# Patient Record
Sex: Female | Born: 1960
Health system: Southern US, Community
[De-identification: ages and names within clinical notes are randomized; demographics above are authoritative.]

## PROBLEM LIST (undated history)

## (undated) DIAGNOSIS — K859 Acute pancreatitis without necrosis or infection, unspecified: Secondary | ICD-10-CM

## (undated) DIAGNOSIS — E785 Hyperlipidemia, unspecified: Secondary | ICD-10-CM

## (undated) DIAGNOSIS — K219 Gastro-esophageal reflux disease without esophagitis: Secondary | ICD-10-CM

## (undated) DIAGNOSIS — K529 Noninfective gastroenteritis and colitis, unspecified: Secondary | ICD-10-CM

## (undated) DIAGNOSIS — I671 Cerebral aneurysm, nonruptured: Secondary | ICD-10-CM

## (undated) DIAGNOSIS — I493 Ventricular premature depolarization: Secondary | ICD-10-CM

## (undated) DIAGNOSIS — Z720 Tobacco use: Secondary | ICD-10-CM

## (undated) DIAGNOSIS — Q2381 Bicuspid aortic valve: Secondary | ICD-10-CM

## (undated) DIAGNOSIS — R011 Cardiac murmur, unspecified: Secondary | ICD-10-CM

## (undated) DIAGNOSIS — I351 Nonrheumatic aortic (valve) insufficiency: Secondary | ICD-10-CM

## (undated) DIAGNOSIS — M797 Fibromyalgia: Secondary | ICD-10-CM

## (undated) DIAGNOSIS — I10 Essential (primary) hypertension: Secondary | ICD-10-CM

## (undated) DIAGNOSIS — Q231 Congenital insufficiency of aortic valve: Secondary | ICD-10-CM

## (undated) DIAGNOSIS — K8020 Calculus of gallbladder without cholecystitis without obstruction: Secondary | ICD-10-CM

## (undated) HISTORY — DX: Cerebral aneurysm, nonruptured: I67.1

## (undated) HISTORY — DX: Cardiac murmur, unspecified: R01.1

## (undated) HISTORY — DX: Fibromyalgia: M79.7

## (undated) HISTORY — DX: Calculus of gallbladder without cholecystitis without obstruction: K80.20

## (undated) HISTORY — DX: Bicuspid aortic valve: Q23.81

## (undated) HISTORY — DX: Acute pancreatitis without necrosis or infection, unspecified: K85.90

## (undated) HISTORY — DX: Congenital insufficiency of aortic valve: Q23.1

## (undated) HISTORY — DX: Hyperlipidemia, unspecified: E78.5

## (undated) HISTORY — PX: ABDOMINAL HYSTERECTOMY: SHX81

## (undated) HISTORY — PX: APPENDECTOMY: SHX54

## (undated) HISTORY — PX: CHOLECYSTECTOMY: SHX55

## (undated) HISTORY — DX: Essential (primary) hypertension: I10

## (undated) HISTORY — DX: Noninfective gastroenteritis and colitis, unspecified: K52.9

---

## 1998-03-22 ENCOUNTER — Encounter: Payer: Self-pay | Admitting: Emergency Medicine

## 1998-03-22 ENCOUNTER — Emergency Department (HOSPITAL_COMMUNITY): Admission: EM | Admit: 1998-03-22 | Discharge: 1998-03-22 | Payer: Self-pay | Admitting: Emergency Medicine

## 2001-03-20 ENCOUNTER — Inpatient Hospital Stay (HOSPITAL_COMMUNITY): Admission: EM | Admit: 2001-03-20 | Discharge: 2001-03-21 | Payer: Self-pay | Admitting: *Deleted

## 2001-03-20 ENCOUNTER — Encounter: Payer: Self-pay | Admitting: *Deleted

## 2001-05-31 ENCOUNTER — Other Ambulatory Visit: Admission: RE | Admit: 2001-05-31 | Discharge: 2001-05-31 | Payer: Self-pay | Admitting: Obstetrics and Gynecology

## 2011-06-03 ENCOUNTER — Ambulatory Visit: Payer: Self-pay | Admitting: Family Medicine

## 2011-06-03 DIAGNOSIS — R109 Unspecified abdominal pain: Secondary | ICD-10-CM

## 2011-06-03 LAB — POCT CBC
Granulocyte percent: 79.7 %G (ref 37–80)
HCT, POC: 39 % (ref 37.7–47.9)
Hemoglobin: 15.5 g/dL (ref 12.2–16.2)
Lymph, poc: 2.2 (ref 0.6–3.4)
MCH, POC: 29 pg (ref 27–31.2)
MCHC: 32.1 g/dL (ref 31.8–35.4)
MCV: 90.5 fL (ref 80–97)
MID (cbc): 0.7 (ref 0–0.9)
MPV: 9.6 fL (ref 0–99.8)
POC Granulocyte: 11.6 — AB (ref 2–6.9)
POC LYMPH PERCENT: 15.2 %L (ref 10–50)
POC MID %: 5.1 %M (ref 0–12)
Platelet Count, POC: 313 10*3/uL (ref 142–424)
RBC: 4.31 M/uL (ref 4.04–5.48)
RDW, POC: 14 %
WBC: 14.6 10*3/uL — AB (ref 4.6–10.2)

## 2011-06-03 MED ORDER — PROMETHAZINE HCL 25 MG PO TABS: 25.0000 mg | ORAL_TABLET | Freq: Three times a day (TID) | ORAL | Status: DC | PRN

## 2011-06-03 MED ORDER — HYDROCODONE-ACETAMINOPHEN 5-500 MG PO TABS: 1.0000 | ORAL_TABLET | Freq: Three times a day (TID) | ORAL | Status: AC | PRN

## 2011-06-03 MED ORDER — KETOROLAC TROMETHAMINE 60 MG/2ML IM SOLN
60.0000 mg | Freq: Once | INTRAMUSCULAR | Status: AC
  Administered 2011-06-03: 60 mg via INTRAMUSCULAR

## 2011-06-03 MED ORDER — PROMETHAZINE HCL 25 MG/ML IJ SOLN
25.0000 mg | Freq: Four times a day (QID) | INTRAMUSCULAR | Status: DC | PRN
  Administered 2011-06-03: 25 mg via INTRAMUSCULAR

## 2011-06-03 NOTE — Progress Notes (Signed)
  Subjective:    Patient ID: Mary Wade, female    DOB: 05/09/1960, 51 y.o.   MRN: [id number]  HPI 51 yo female with abdominal complaints. Acid reflux type symptoms about 10pm.  ABout midnight abdominal pain worsened.  Took zantac - helped some.  Was able to sleep.  3am pain returned.  Tried prilosec - no help.  Vomitting started.  Frequent and recurrent.  Anytime she eats or drinks anything it comes back up.  Even water.  No diarrhea.  No fever.   S/p cholecystecomy and appendectomy.    Similar bout of this previously - never really diagnosed.  Just given phenergan and told to drink fluids and in a few days improved.  +EtOH drinker - last heavy drinking was SAturday night.   Review of Systems Negative except as per HPI     Objective:   Physical Exam  Constitutional: Vital signs are normal. She appears well-developed and well-nourished. She is active.  Cardiovascular: Normal rate, regular rhythm, normal heart sounds and normal pulses.   Pulmonary/Chest: Effort normal and breath sounds normal.  Abdominal: Soft. Normal appearance and bowel sounds are normal. She exhibits no distension and no mass. There is no hepatosplenomegaly. There is tenderness. There is no rigidity, no rebound, no guarding, no CVA tenderness, no tenderness at McBurney's point and negative Murphy's sign. No hernia.       Epigastric abdominal pain   Neurological: She is alert.    Results for orders placed in visit on 06/03/11  POCT CBC      Component Value Range   WBC 14.6 (*) 4.6 - 10.2 (K/uL)   Lymph, poc 2.2  0.6 - 3.4    POC LYMPH PERCENT 15.2  10 - 50 (%L)   MID (cbc) 0.7  0 - 0.9    POC MID % 5.1  0 - 12 (%M)   POC Granulocyte 11.6 (*) 2 - 6.9    Granulocyte percent 79.7  37 - 80 (%G)   RBC 4.31  4.04 - 5.48 (M/uL)   Hemoglobin 15.5  12.2 - 16.2 (g/dL)   HCT, POC 13.2  44.0 - 47.9 (%)   MCV 90.5  80 - 97 (fL)   MCH, POC 29.0  27 - 31.2 (pg)   MCHC 32.1  31.8 - 35.4 (g/dL)   RDW, POC 10.2     Platelet Count, POC 313  142 - 424 (K/uL)   MPV 9.6  0 - 99.8 (fL)         Assessment & Plan:  Abdominal pain, leukocytosis - likely pancreatitis.  Given 1L NS here, phenergan and toradol with some relief of pain and nausea.  Send home with phenergan and vicodin 5.  Clear liquid diet.  Await lipase and cmet.  F/U in 48 hours.  Discussed red flags to go to ED (intense pain unrelievved at all by meds or prolonged nausea and vomitting)

## 2011-06-05 ENCOUNTER — Ambulatory Visit: Payer: Self-pay | Admitting: Emergency Medicine

## 2011-06-05 DIAGNOSIS — R109 Unspecified abdominal pain: Secondary | ICD-10-CM

## 2011-06-05 DIAGNOSIS — I10 Essential (primary) hypertension: Secondary | ICD-10-CM

## 2011-06-05 DIAGNOSIS — G4700 Insomnia, unspecified: Secondary | ICD-10-CM

## 2011-06-05 LAB — COMPREHENSIVE METABOLIC PANEL
ALT: 36 U/L — ABNORMAL HIGH (ref 0–35)
AST: 24 U/L (ref 0–37)
Albumin: 4.3 g/dL (ref 3.5–5.2)
Alkaline Phosphatase: 66 U/L (ref 39–117)
BUN: 6 mg/dL (ref 6–23)
CO2: 26 mEq/L (ref 19–32)
Calcium: 9.5 mg/dL (ref 8.4–10.5)
Chloride: 99 mEq/L (ref 96–112)
Creat: 0.42 mg/dL — ABNORMAL LOW (ref 0.50–1.10)
Glucose, Bld: 117 mg/dL — ABNORMAL HIGH (ref 70–99)
Potassium: 3.5 mEq/L (ref 3.5–5.3)
Sodium: 136 mEq/L (ref 135–145)
Total Bilirubin: 0.6 mg/dL (ref 0.3–1.2)
Total Protein: 7 g/dL (ref 6.0–8.3)

## 2011-06-05 LAB — POCT CBC
Lymph, poc: 2.8 (ref 0.6–3.4)
MCH, POC: 29.1 pg (ref 27–31.2)
MCHC: 31.8 g/dL (ref 31.8–35.4)
MPV: 9.1 fL (ref 0–99.8)
POC Granulocyte: 4.6 (ref 2–6.9)
POC LYMPH PERCENT: 35.3 %L (ref 10–50)
POC MID %: 6.3 %M (ref 0–12)
RDW, POC: 13.9 %
WBC: 7.9 10*3/uL (ref 4.6–10.2)

## 2011-06-05 LAB — LIPASE: Lipase: 17 U/L (ref 0–75)

## 2011-06-05 MED ORDER — BISOPROLOL-HYDROCHLOROTHIAZIDE 5-6.25 MG PO TABS: 1.0000 | ORAL_TABLET | Freq: Every day | ORAL | Status: DC

## 2011-06-05 MED ORDER — ALPRAZOLAM 0.5 MG PO TABS: 0.5000 mg | ORAL_TABLET | Freq: Every evening | ORAL | Status: DC | PRN

## 2011-06-05 NOTE — Patient Instructions (Signed)
Acute Pancreatitis The pancreas is a large gland located behind your stomach. It produces (secretes) enzymes. These enzymes help digest food. It also releases the hormones glucagon and insulin. These hormones help regulate blood sugar. When the pancreas becomes inflamed, the disease is called pancreatitis. Inflammation of the pancreas occurs when enzymes from the pancreas begin attacking and digesting the pancreas. CAUSES  Most cases ofsudden onset (acute) pancreatitis are caused by:  Alcohol abuse.   Gallstones.  Other less common causes are:  Some medications.   Exposure to certain chemicals   Infection.   Damage caused by an accident (trauma).   Surgery of the belly (abdomen).  SYMPTOMS  Acute pancreatitis usually begins with pain in the upper abdomen and may radiate to the back. This pain may last a couple days. The constant pain varies from mild to severe. The acute form of this disease may vary from mild, nonspecific abdominal pain to profound shock with coma. About 1 in 5 cases are severe. These patients become dehydrated and develop low blood pressure. In severe cases, bleeding into the pancreas can lead to shock and death. The lungs, heart, and kidneys may fail. DIAGNOSIS  Your caregiver will form a clinical opinion after giving you an exam. Laboratory work is used to confirm this diagnosis. Often,a digestive enzyme from the pancreas (serum amylase) and other enzymes are elevated. Sugars and fats (lipids) in the blood may be elevated. There may also be changes in the following levels: calcium, magnesium, potassium, chloride and bicarbonate (chemicals in the blood). X-rays, a CT scan, or ultrasound of your abdomen may be necessary to search for other causes of your abdominal pain. TREATMENT  Most pancreatitis requires treatment of symptoms. Most acute attacks last a couple of days. Your caregiver can discuss the treatment options with you.  If complications occur, hospitalization  may be necessary for pain control and intravenous (IV) fluid replacement.   Sometimes, a tube may be put into the stomach to control vomiting.   Food may not be allowed for 3 to 4 days. This gives the pancreas time to rest. Giving the pancreas a rest means there is no stimulation that would produce more enzymes and cause more damage.   Medicines (antibiotics) that kill germs may be given if infection is the cause.   Sometimes, surgery may be required.   Following an acute attack, your caregiver will determine the cause, if possible, and offer suggestions to prevent recurrences.  HOME CARE INSTRUCTIONS   Eat smaller, more frequent meals. This reduces the amount of digestive juices the pancreas produces.   Decrease the amount of fat in your diet. This may help reduce loose, diarrheal stools.   Drink enough water and fluids to keep your urine clear or pale yellow. This is to avoid dehydration which can cause increased pain.   Talk to your caregiver about pain relievers or other medicines that may help.   Avoid anything that may have triggered your pancreatitis (for example, alcohol).   Follow the diet advised by your caregiver. Do not advance the diet too soon.   Take medicines as prescribed.   Get plenty of rest.   Check your blood sugar at home as directed by your caregiver.   If your caregiver has given you a follow-up appointment, it is very important to keep that appointment. Not keeping the appointment could result in a lasting (chronic) or permanent injury, pain, and disability. If there is any problem keeping the appointment, you must call to reschedule.    SEEK MEDICAL CARE IF:   You are not recovering in the time described by your caregiver.   You have persistent pain, weakness, or feel sick to your stomach (nauseous).   You have recovered and then have another bout of pain.  SEEK IMMEDIATE MEDICAL CARE IF:   You are unable to eat or keep fluids down.   Your pain  increases a lot or changes.   You have an oral temperature above 102 F (38.9 C), not controlled by medicine.   Your skin or the white part of your eyes look yellow (jaundice).   You develop vomiting.   You feel dizzy or faint.   Your blood sugar is high (over 300).  MAKE SURE YOU:   Understand these instructions.   Will watch your condition.   Will get help right away if you are not doing well or get worse.  Document Released: 02/01/2005 Document Revised: 01/21/2011 Document Reviewed: 09/15/2007 [employer] Patient Information 2012 [employer]. 

## 2011-06-05 NOTE — Progress Notes (Signed)
  Subjective:    Patient ID: Mary Wade, female    DOB: Feb 20, 1960, 51 y.o.   MRN: [id number]  HPI patient seen 48 hours ago with severe midepigastric abdominal pain nausea and vomiting she does have a history of heavy alcohol use. She was treated for pancreatitis she received IV fluids pain medications and returns today markedly better. Lab work returned revealed minimal elevation in one LFT as well as elevation in her white count to her lipase which was done was normal.    Review of Systems patient does smoke. She does drink heavily on Thursdays and 2 other days per week she drinks up to 4 drinks a day.     Objective:   Physical Exam  Constitutional: She appears well-developed and well-nourished.  HENT:  Head: Normocephalic.  Eyes: Pupils are equal, round, and reactive to light.  Neck: No tracheal deviation present. No thyromegaly present.  Cardiovascular: Normal rate and regular rhythm.   Pulmonary/Chest: Effort normal and breath sounds normal.  Abdominal: Soft. Bowel sounds are normal. There is tenderness. There is guarding.       The patient has exquisite tenderness in her mid epigastrium. This is not associated with rebound.        Assessment & Plan:   Patient here to followup on abdominal pain. She is markedly better than previous. She does not appear acutely ill today. I do think that alcohol is an issue and will discuss with her alternatives to help with that. We'll check CBC today

## 2011-09-08 ENCOUNTER — Other Ambulatory Visit: Payer: Self-pay | Admitting: Family Medicine

## 2011-12-05 ENCOUNTER — Other Ambulatory Visit: Payer: Self-pay | Admitting: Emergency Medicine

## 2012-03-21 ENCOUNTER — Other Ambulatory Visit: Payer: Self-pay | Admitting: Physician Assistant

## 2012-03-21 ENCOUNTER — Other Ambulatory Visit: Payer: Self-pay | Admitting: Emergency Medicine

## 2012-04-03 ENCOUNTER — Other Ambulatory Visit: Payer: Self-pay | Admitting: Emergency Medicine

## 2012-04-28 ENCOUNTER — Ambulatory Visit: Payer: Self-pay | Admitting: Emergency Medicine

## 2012-04-28 VITALS — BP 160/82 | HR 80 | Temp 98.4°F | Resp 16 | Ht 64.0 in | Wt 166.6 lb

## 2012-04-28 DIAGNOSIS — R8281 Pyuria: Secondary | ICD-10-CM

## 2012-04-28 DIAGNOSIS — R82998 Other abnormal findings in urine: Secondary | ICD-10-CM

## 2012-04-28 DIAGNOSIS — G4700 Insomnia, unspecified: Secondary | ICD-10-CM

## 2012-04-28 DIAGNOSIS — R635 Abnormal weight gain: Secondary | ICD-10-CM

## 2012-04-28 DIAGNOSIS — R300 Dysuria: Secondary | ICD-10-CM

## 2012-04-28 DIAGNOSIS — R350 Frequency of micturition: Secondary | ICD-10-CM

## 2012-04-28 DIAGNOSIS — I10 Essential (primary) hypertension: Secondary | ICD-10-CM

## 2012-04-28 LAB — POCT UA - MICROSCOPIC ONLY
Casts, Ur, LPF, POC: NEGATIVE
Crystals, Ur, HPF, POC: NEGATIVE
Mucus, UA: NEGATIVE
Yeast, UA: NEGATIVE

## 2012-04-28 LAB — COMPREHENSIVE METABOLIC PANEL
ALT: 26 U/L (ref 0–35)
Albumin: 4.6 g/dL (ref 3.5–5.2)
CO2: 27 mEq/L (ref 19–32)
Chloride: 104 mEq/L (ref 96–112)
Potassium: 4.3 mEq/L (ref 3.5–5.3)
Sodium: 139 mEq/L (ref 135–145)
Total Bilirubin: 0.4 mg/dL (ref 0.3–1.2)
Total Protein: 7.1 g/dL (ref 6.0–8.3)

## 2012-04-28 LAB — POCT URINALYSIS DIPSTICK
Bilirubin, UA: NEGATIVE
Glucose, UA: NEGATIVE
Ketones, UA: NEGATIVE
Nitrite, UA: NEGATIVE
Protein, UA: NEGATIVE
Spec Grav, UA: <=1.005
Urobilinogen, UA: 0.2
pH, UA: 6

## 2012-04-28 LAB — POCT CBC
Granulocyte percent: 63.5 %G (ref 37–80)
Hemoglobin: 13.3 g/dL (ref 12.2–16.2)
MCHC: 31.9 g/dL (ref 31.8–35.4)
MPV: 9.5 fL (ref 0–99.8)
POC Granulocyte: 5.8 (ref 2–6.9)
POC MID %: 6.5 %M (ref 0–12)
RBC: 4.49 M/uL (ref 4.04–5.48)

## 2012-04-28 LAB — LIPID PANEL: Cholesterol: 239 mg/dL — ABNORMAL HIGH (ref 0–200)

## 2012-04-28 MED ORDER — ALPRAZOLAM 0.5 MG PO TABS: 0.5000 mg | ORAL_TABLET | Freq: Every evening | ORAL | Status: DC | PRN

## 2012-04-28 MED ORDER — BISOPROLOL-HYDROCHLOROTHIAZIDE 10-6.25 MG PO TABS: 1.0000 | ORAL_TABLET | Freq: Every day | ORAL | Status: DC

## 2012-04-28 MED ORDER — CIPROFLOXACIN HCL 250 MG PO TABS: 250.0000 mg | ORAL_TABLET | Freq: Two times a day (BID) | ORAL | Status: DC

## 2012-04-28 NOTE — Progress Notes (Signed)
  Subjective:    Patient ID: Mary Wade, female    DOB: [birth year], 52 y.o.   MRN: [id number]  HPI patient and her to refill her medications. She currently takes Xanax 1 at that time but takes it only 3-4 times a week and not every night. Problem #2 is hypertension. She does not check her blood pressure at home. She is unsure whether her pressures have been controlled or not. Patient also has significant burning on urination. She has no abdominal pain no flank pain no fever    Review of Systems patient states she has started back smoking. She quit for about 3 months and then put on weight and now refuses to try to stop again. She has been taking her medications regularly as instructed     Objective:   Physical Exam patient is alert and cooperative. Her neck is supple. Her chest is clear to auscultation and percussion. Heart is regular rate without murmurs or gallops. Abdomen soft nontender liver and spleen not enlarged  Results for orders placed in visit on 04/28/12  POCT UA - MICROSCOPIC ONLY      Result Value Range   WBC, Ur, HPF, POC 2-4     RBC, urine, microscopic 2-4     Bacteria, U Microscopic trace     Mucus, UA neg     Epithelial cells, urine per micros 1-3     Crystals, Ur, HPF, POC neg     Casts, Ur, LPF, POC neg     Yeast, UA neg    POCT URINALYSIS DIPSTICK      Result Value Range   Color, UA yellow     Clarity, UA clear     Glucose, UA neg     Bilirubin, UA neg     Ketones, UA neg     Spec Grav, UA <=1.005     Blood, UA small     pH, UA 6.0     Protein, UA neg     Urobilinogen, UA 0.2     Nitrite, UA neg     Leukocytes, UA Trace    POCT CBC      Result Value Range   WBC 9.1  4.6 - 10.2 K/uL   Lymph, poc 2.7  0.6 - 3.4   POC LYMPH PERCENT 30.0  10 - 50 %L   MID (cbc) 0.6  0 - 0.9   POC MID % 6.5  0 - 12 %M   POC Granulocyte 5.8  2 - 6.9   Granulocyte percent 63.5  37 - 80 %G   RBC 4.49  4.04 - 5.48 M/uL   Hemoglobin 13.3  12.2 - 16.2 g/dL   HCT, POC 78.2   95.6 - 47.9 %   MCV 92.8  80 - 97 fL   MCH, POC 29.6  27 - 31.2 pg   MCHC 31.9  31.8 - 35.4 g/dL   RDW, POC 21.3     Platelet Count, POC 333  142 - 424 K/uL   MPV 9.5  0 - 99.8 fL        Assessment & Plan:  Will increase her Ziac to 10/6 0.25 due to the elevated pressure. I have refilled her Xanax waiting on her urine result

## 2012-05-01 LAB — URINE CULTURE: Colony Count: >=100000

## 2012-05-09 ENCOUNTER — Other Ambulatory Visit: Payer: Self-pay | Admitting: Physician Assistant

## 2012-05-09 MED ORDER — PROMETHAZINE HCL 25 MG PO TABS: ORAL_TABLET | ORAL | Status: DC

## 2012-05-09 NOTE — Telephone Encounter (Signed)
Please call and get more information about her need for Phenergan. She is not a regular patient of mine and I am not sure why she needs the Phenergan. Please get more information and then send it back to me. I saw her recently for another problem and not related to Phenergan

## 2012-05-09 NOTE — Telephone Encounter (Signed)
Dr Cleta Alberts, do you want to give RFs of phenergan? I wasn't sure if pt has a chronic problem w/intermittent nausea or if it was Rxd only for an acute problem.

## 2012-05-09 NOTE — Telephone Encounter (Signed)
Please call patient get more information. I think this prescription was written by [facility] [employer]. I am not sure why she is on Phenergan. I saw her for an acute visit for another problem.

## 2012-05-09 NOTE — Telephone Encounter (Signed)
Sent in RF and notified pt. 

## 2012-05-09 NOTE — Addendum Note (Signed)
Addended by: Sheppard Plumber A on: 05/09/2012 04:31 PM   Modules accepted: Orders

## 2012-05-09 NOTE — Telephone Encounter (Signed)
Pt stated that she was originally Rxd phenergan for pancreatitis and when she was in and saw Dr Cleta Alberts, he had asked her if she wanted it RFd to have on hand for when she gets flare ups of pancreatitis, but at the time declined because she hasn't needed it lately. She stated she is not having problems w/pancreatitis now, but she and several members of her family have a horrible stomach bug and she had requested the RF for the associated nausea. I D/W pt S/S of dehydration and advised her that she/family members should come in for eval/fluids if dehydration is suspected. Pt agreed. Dr Cleta Alberts, do you want to RF the phenergan?

## 2012-05-09 NOTE — Telephone Encounter (Signed)
Okay to refill the Phenergan

## 2012-08-10 ENCOUNTER — Telehealth: Payer: Self-pay | Admitting: Radiology

## 2012-08-10 NOTE — Telephone Encounter (Signed)
Patient advised of need for office visit for wellness form to be filled out. Form placed at front desk, for her, not filled out

## 2012-08-22 ENCOUNTER — Ambulatory Visit: Payer: Self-pay | Admitting: Family Medicine

## 2012-08-22 VITALS — BP 122/76 | HR 66 | Temp 97.9°F | Resp 16 | Ht 64.0 in | Wt 161.0 lb

## 2012-08-22 DIAGNOSIS — E7800 Pure hypercholesterolemia, unspecified: Secondary | ICD-10-CM

## 2012-08-22 DIAGNOSIS — G4700 Insomnia, unspecified: Secondary | ICD-10-CM

## 2012-08-22 LAB — LIPID PANEL
Cholesterol: 260 mg/dL — ABNORMAL HIGH (ref 0–200)
HDL: 37 mg/dL — ABNORMAL LOW (ref >39)
Total CHOL/HDL Ratio: 7 ratio
Triglycerides: 633 mg/dL — ABNORMAL HIGH (ref <150)

## 2012-08-22 MED ORDER — ALPRAZOLAM 0.5 MG PO TABS: 0.5000 mg | ORAL_TABLET | Freq: Every evening | ORAL | Status: DC | PRN

## 2012-08-22 NOTE — Progress Notes (Signed)
[facility]:  Office Visit  Chief Complaint:  Chief Complaint  Patient presents with  . Labs Only    triglyceride  . Medical Clearance    Mary Wade and needs form comlete    HPI: Mary Wade is a 52 y.o. female who complains of : 1. HTN-doing well. She is taking her Ziac regular. Does not measure BP. No SEs.  2. She would like to get her lipids rechecked. Last time had eaten, has been taking herbal supplements and also red yeast rice, fishoil and tried niacin but had flushing, did not take asa with it.  3. Xanax-for stress and sleep prn, no SI/SI/HI   Past Medical History  Diagnosis Date  . Hypertension    Past Surgical History  Procedure Laterality Date  . Appendectomy    . Abdominal hysterectomy    . Cholecystectomy     History   Social History  . Marital Status: Single    Spouse Name: N/A    Number of Children: N/A  . Years of Education: N/A   Social History Main Topics  . Smoking status: Current Every Day Smoker -- 20.00 packs/day    Types: Cigarettes  . Smokeless tobacco: None  . Alcohol Use: Yes     Comment: sometimes  . Drug Use: No  . Sexually Active: Yes   Other Topics Concern  . None   Social History Narrative  . None   Family History  Problem Relation Age of Onset  . Heart disease Mother   . Heart disease Father    No Known Allergies Prior to Admission medications   Medication Sig Start Date End Date Taking? Authorizing Provider  ALPRAZolam Prudy Feeler) 0.5 MG tablet Take 1 tablet (0.5 mg total) by mouth at bedtime as needed for sleep. 04/28/12  Yes Mary Gobble, MD  bisoprolol-hydrochlorothiazide [facility]) 10-6.25 MG per tablet Take 1 tablet by mouth daily. 04/28/12  Yes Mary Gobble, MD     ROS: The patient denies fevers, chills, night sweats, unintentional weight loss, chest pain, palpitations, wheezing, dyspnea on exertion, nausea, vomiting, abdominal pain, dysuria, hematuria, melena, numbness, weakness, or tingling.    All other systems have been reviewed and were otherwise negative with the exception of those mentioned in the HPI and as above.    PHYSICAL EXAM: Filed Vitals:   08/22/12 0758  BP: 122/76  Pulse: 66  Temp: 97.9 F (36.6 C)  Resp: 16   Filed Vitals:   08/22/12 0758  Height: 5\' 4"  (1.626 m)  Weight: 161 lb (73.029 kg)   Body mass index is 27.62 kg/(m^2).  General: Alert, no acute distress HEENT:  Normocephalic, atraumatic, oropharynx patent. EOMI, PERRLA, fundoscopic exam normal.  Cardiovascular:  Regular rate and rhythm, no rubs murmurs or gallops.  No Carotid bruits, radial pulse intact. No pedal edema.  Respiratory: Clear to auscultation bilaterally.  No wheezes, rales, or rhonchi.  No cyanosis, no use of accessory musculature GI: No organomegaly, abdomen is soft and non-tender, positive bowel sounds.  No masses. Skin: No rashes. Neurologic: Facial musculature symmetric. Psychiatric: Patient is appropriate throughout our interaction. Lymphatic: No cervical lymphadenopathy Musculoskeletal: Gait intact. 5/5 stegnth    LABS: Results for orders placed in visit on 04/28/12  URINE CULTURE      Result Value Range   Culture ESCHERICHIA COLI     Colony Count >=100,000 COLONIES/ML     Organism ID, Bacteria ESCHERICHIA COLI    COMPREHENSIVE METABOLIC PANEL  Result Value Range   Sodium 139  135 - 145 mEq/L   Potassium 4.3  3.5 - 5.3 mEq/L   Chloride 104  96 - 112 mEq/L   CO2 27  19 - 32 mEq/L   Glucose, Bld 94  70 - 99 mg/dL   BUN 13  6 - 23 mg/dL   Creat 4.54 (*) 0.98 - 1.10 mg/dL   Total Bilirubin 0.4  0.3 - 1.2 mg/dL   Alkaline Phosphatase 69  39 - 117 U/L   AST 20  0 - 37 U/L   ALT 26  0 - 35 U/L   Total Protein 7.1  6.0 - 8.3 g/dL   Albumin 4.6  3.5 - 5.2 g/dL   Calcium 9.4  8.4 - 11.9 mg/dL  LIPID PANEL      Result Value Range   Cholesterol 239 (*) 0 - 200 mg/dL   Triglycerides 147 (*) <150 mg/dL   HDL 38 (*) >82 mg/dL   Total CHOL/HDL Ratio 6.3     VLDL  NOT CALC  0 - 40 mg/dL   LDL Cholesterol      POCT UA - MICROSCOPIC ONLY      Result Value Range   WBC, Ur, HPF, POC 2-4     RBC, urine, microscopic 2-4     Bacteria, U Microscopic trace     Mucus, UA neg     Epithelial cells, urine per micros 1-3     Crystals, Ur, HPF, POC neg     Casts, Ur, LPF, POC neg     Yeast, UA neg    POCT URINALYSIS DIPSTICK      Result Value Range   Color, UA yellow     Clarity, UA clear     Glucose, UA neg     Bilirubin, UA neg     Ketones, UA neg     Spec Grav, UA <=1.005     Blood, UA small     pH, UA 6.0     Protein, UA neg     Urobilinogen, UA 0.2     Nitrite, UA neg     Leukocytes, UA Trace    POCT CBC      Result Value Range   WBC 9.1  4.6 - 10.2 K/uL   Lymph, poc 2.7  0.6 - 3.4   POC LYMPH PERCENT 30.0  10 - 50 %L   MID (cbc) 0.6  0 - 0.9   POC MID % 6.5  0 - 12 %M   POC Granulocyte 5.8  2 - 6.9   Granulocyte percent 63.5  37 - 80 %G   RBC 4.49  4.04 - 5.48 M/uL   Hemoglobin 13.3  12.2 - 16.2 g/dL   HCT, POC 95.6  21.3 - 47.9 %   MCV 92.8  80 - 97 fL   MCH, POC 29.6  27 - 31.2 pg   MCHC 31.9  31.8 - 35.4 g/dL   RDW, POC 08.6     Platelet Count, POC 333  142 - 424 K/uL   MPV 9.5  0 - 99.8 fL     EKG/XRAY:   Primary read interpreted by Dr. Conley Wade at [facility].   ASSESSMENT/PLAN: Encounter Diagnoses  Name Primary?  . High cholesterol Yes  . Insomnia    Will get repeat lipid panels. She ate the last time she was here. She has been taking red yeast rice.  If she has high cholesterol then will put her on statin  and then ask her to return in 3 months and we can recheck her lipids and also CMP. Refilled Xanax for insomnia Physcial forms completed. 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  F/u prn or in 6 months for BP recheck    Mary Skilton PHUONG, DO 08/22/2012 8:30 AM

## 2012-09-03 ENCOUNTER — Encounter: Payer: Self-pay | Admitting: Family Medicine

## 2012-09-03 ENCOUNTER — Telehealth: Payer: Self-pay | Admitting: Family Medicine

## 2012-09-03 DIAGNOSIS — E785 Hyperlipidemia, unspecified: Secondary | ICD-10-CM

## 2012-09-03 MED ORDER — PRAVASTATIN SODIUM 40 MG PO TABS: 40.0000 mg | ORAL_TABLET | Freq: Every day | ORAL | Status: DC

## 2012-09-03 NOTE — Telephone Encounter (Signed)
LM that I will rx her statin and she should come back in 3 months to get fasting lipid and CMP rechecked. Gross SEs left on message. Will also send letter. If she has any questions or do not want to take meds she cn call me.

## 2012-09-04 ENCOUNTER — Telehealth: Payer: Self-pay

## 2012-09-04 NOTE — Telephone Encounter (Signed)
Pt would like to know the actual numbers from her labs  Bf

## 2012-09-28 ENCOUNTER — Telehealth: Payer: Self-pay

## 2012-09-28 DIAGNOSIS — E785 Hyperlipidemia, unspecified: Secondary | ICD-10-CM

## 2012-09-28 NOTE — Telephone Encounter (Signed)
Lipids just done last month, is it too soon?

## 2012-09-28 NOTE — Telephone Encounter (Signed)
Patient would like a lab order for her Triglycerides She has no insurance  Okay to leave message.  [phone]

## 2012-10-09 NOTE — Telephone Encounter (Signed)
Spoke with patient. She started on statin. She feels ok, not great, some nausea. She has only been on it for 1 week. I advise her that we can get repeat labs in 2-3 months, I will put those labs in the computer. IF labs are ok and she is not having advers Es she will follow-up 6 months from when she got her repeat labs done. She will only come in for labs and if she has worsening SEs then she will let me know and get an OV. She does not have insurance at this time. We did discuss SEs of medicine but she has a strong family h/o CAD. Gross sideeffects, risk and benefits, and alternatives of medications d/w patient. Patient is aware that all medications have potential sideeffects and we are unable to predict every sideeffect or drug-drug interaction that may occur.

## 2012-10-11 ENCOUNTER — Telehealth: Payer: Self-pay

## 2012-10-11 NOTE — Telephone Encounter (Signed)
Patient was advised to d/c this medication, do you want to change to something else?

## 2012-10-11 NOTE — Telephone Encounter (Signed)
Patient has been taking provastatin for 2 weeks now and her stomach has felt upset ever since.  Was told by Dr.Le to call if she felt bad.  Call today if before 5 at [phone] or after at [phone].

## 2012-10-12 ENCOUNTER — Ambulatory Visit: Payer: Self-pay | Admitting: Family Medicine

## 2012-10-12 VITALS — BP 144/84 | HR 59 | Temp 98.2°F | Resp 16 | Ht 64.0 in | Wt 160.0 lb

## 2012-10-12 DIAGNOSIS — E785 Hyperlipidemia, unspecified: Secondary | ICD-10-CM

## 2012-10-12 LAB — COMPREHENSIVE METABOLIC PANEL
ALT: 22 U/L (ref 0–35)
Albumin: 4.2 g/dL (ref 3.5–5.2)
CO2: 26 mEq/L (ref 19–32)
Calcium: 8.9 mg/dL (ref 8.4–10.5)
Chloride: 102 mEq/L (ref 96–112)
Potassium: 3.7 mEq/L (ref 3.5–5.3)
Sodium: 136 mEq/L (ref 135–145)
Total Protein: 6.6 g/dL (ref 6.0–8.3)

## 2012-10-12 LAB — COMPREHENSIVE METABOLIC PANEL WITH GFR
AST: 20 U/L (ref 0–37)
Alkaline Phosphatase: 55 U/L (ref 39–117)
BUN: 18 mg/dL (ref 6–23)
Creat: 0.54 mg/dL (ref 0.50–1.10)
Glucose, Bld: 90 mg/dL (ref 70–99)
Total Bilirubin: 0.5 mg/dL (ref 0.3–1.2)

## 2012-10-12 NOTE — Telephone Encounter (Signed)
Spoke with patient, she will come in for recheck and also fast track. She has HA, no energy, body aches, urine is dark yellow, she is drinking plenty of water, stopped statin yesterday has been on it for 2 weeks. She is not taking red yeast rice or taking Niacin. I will fast track her to see her in our office today. Only on statin, fish oil and xanax and gummy vitamins

## 2012-10-12 NOTE — Progress Notes (Signed)
 [facility]:  Office Visit  Chief Complaint:  Chief Complaint  Patient presents with  . Follow-up    HPI: Mary Wade is a 52 y.o. female who complains of here for SEs from pravastatin 40 mg daily x 2 weeks, feeling achey but ok up until yesterday, urine is dark but drinking lots of water and feeling better. However she was having HA, generalized muscle pains and also just not feeling well. Prior to being on a statin she was tried a short course ( less than a bottle) of red yeast rice and also Niacin which she took 500 mg of and did not take with ASA and was getting hot falshes and feeling flushed. She stopped taking both the red yeast rice and theniacin on our last visit when I rx her the pravastatin for her elevated cholesterol.  Trigylcerides were elevated at 633.   She previosuly had a bout of pancreatitis and I think this was due to her hypertriglycedemia.   Past Medical History  Diagnosis Date  . Hypertension    Past Surgical History  Procedure Laterality Date  . Appendectomy    . Abdominal hysterectomy    . Cholecystectomy     History   Social History  . Marital Status: Single    Spouse Name: N/A    Number of Children: N/A  . Years of Education: N/A   Social History Main Topics  . Smoking status: Current Every Day Smoker -- 20.00 packs/day    Types: Cigarettes  . Smokeless tobacco: None  . Alcohol Use: Yes     Comment: sometimes  . Drug Use: No  . Sexual Activity: Yes   Other Topics Concern  . None   Social History Narrative  . None   Family History  Problem Relation Age of Onset  . Heart disease Mother   . Heart disease Father    No Known Allergies Prior to Admission medications   Medication Sig Start Date End Date Taking? Authorizing Provider  ALPRAZolam Prudy Feeler) 0.5 MG tablet Take 1 tablet (0.5 mg total) by mouth at bedtime as needed for sleep. 08/22/12  Yes  P , DO  bisoprolol-hydrochlorothiazide (ZIAC) 10-6.25 MG per tablet  Take 1 tablet by mouth daily. 04/28/12  Yes Collene Gobble, MD  pravastatin (PRAVACHOL) 10 MG tablet Take 10 mg by mouth daily.   Yes Historical Provider, MD     ROS: The patient denies fevers, chills, night sweats, unintentional weight loss, chest pain, palpitations, wheezing, dyspnea on exertion, nausea, vomiting, abdominal pain, dysuria, hematuria, melena, numbness, , or tingling.   All other systems have been reviewed and were otherwise negative with the exception of those mentioned in the HPI and as above.    PHYSICAL EXAM: Filed Vitals:   10/12/12 1135  BP: 144/84  Pulse: 59  Temp: 98.2 F (36.8 C)  Resp: 16   Filed Vitals:   10/12/12 1135  Height: 5\' 4"  (1.626 m)  Weight: 160 lb (72.576 kg)   Body mass index is 27.45 kg/(m^2).  General: Alert, no acute distress HEENT:  Normocephalic, atraumatic, oropharynx patent. EOMI, PERRLA Cardiovascular:  Regular rate and rhythm, no rubs murmurs or gallops.  No Carotid bruits, radial pulse intact. No pedal edema.  Respiratory: Clear to auscultation bilaterally.  No wheezes, rales, or rhonchi.  No cyanosis, no use of accessory musculature GI: No organomegaly, abdomen is soft and non-tender, positive bowel sounds.  No masses. Skin: No rashes. Neurologic: Facial musculature symmetric. Psychiatric: Patient  is appropriate throughout our interaction. Lymphatic: No cervical lymphadenopathy Musculoskeletal: Gait intact. 2/2 DTRs . 5/5 UE and  strength   LABS: Results for orders placed in visit on 08/22/12  LIPID PANEL      Result Value Range   Cholesterol 260 (*) 0 - 200 mg/dL   Triglycerides 161 (*) <150 mg/dL   HDL 37 (*) >09 mg/dL   Total CHOL/HDL Ratio 7.0     VLDL NOT CALC  0 - 40 mg/dL   LDL Cholesterol         EKG/XRAY:   Primary read interpreted by Dr. Conley Rolls at [facility].   ASSESSMENT/PLAN: Encounter Diagnosis  Name Primary?  . Hyperlipidemia Yes    Will check labs If labs ok then consider niacin, red yeast rice since  she had them before without SEs or a trial of Tricor She has dc the pravastatin 40 mg daily Gross sideeffects, risk and benefits, and alternatives of medications d/w patient. Patient is aware that all medications have potential sideeffects and we are unable to predict every sideeffect or drug-drug interaction that may occur.  Hamilton Capri PHUONG, DO 10/12/2012 12:28 PM

## 2012-10-13 ENCOUNTER — Telehealth: Payer: Self-pay | Admitting: Family Medicine

## 2012-10-13 DIAGNOSIS — E781 Pure hyperglyceridemia: Secondary | ICD-10-CM

## 2012-10-13 LAB — TSH: TSH: 1.383 u[IU]/mL (ref 0.350–4.500)

## 2012-10-13 MED ORDER — FENOFIBRATE 145 MG PO TABS: 145.0000 mg | ORAL_TABLET | Freq: Every day | ORAL | Status: DC

## 2012-10-13 NOTE — Telephone Encounter (Signed)
She is feeling better but not 100%, we talked about her labs and the sideeffect profile of how to lower her triglycerides. We talked about her starting again onniacin vs fibrate. I recommended a fibrate since she has never tried it and also she had flushing with low dose Niacin ( 500 mg daily). She did not use asa 30 min priro to taking the Niacin. She was advised that there is a reduction in TG for both but the fibrates was better in my opinion since it decrease TG and also increases HDL better than Niacin, plus she has SEs from low dose niacin, I ma not sure how she would do with 3 grams niacin daily.  She was instructed to let me know how she wants to proceed either way.IF she does not take tricor then she can take red yeast rise and also her niacin with asa 81 mg 30 min prior to taking meds.  I have sent in Tricor 145 mg daily, advise to return in 8 weeks to get fasting lipid and CMP rechecked. I have advised her I think her last bout of pancreatitis was due to her elevated TG. Conitnue with lifestyle modifications.  Gross sideeffects, risk and benefits, and alternatives of medications d/w patient. Patient is aware that all medications have potential sideeffects and we are unable to predict every sideeffect or drug-drug interaction that may occur.

## 2013-04-04 ENCOUNTER — Other Ambulatory Visit: Payer: Self-pay | Admitting: Family Medicine

## 2013-04-05 NOTE — Telephone Encounter (Signed)
Called in.

## 2013-04-07 ENCOUNTER — Telehealth: Payer: Self-pay | Admitting: Radiology

## 2013-04-07 NOTE — Telephone Encounter (Signed)
Patient called Dr Cleta Albertsaub with a 103 temperature and flu like symptoms; Dr Cleta Albertsaub called in Tamiflu for patient.

## 2013-04-15 ENCOUNTER — Emergency Department (HOSPITAL_COMMUNITY)
Admission: EM | Admit: 2013-04-15 | Discharge: 2013-04-15 | Disposition: A | Discharge status: Home / Self Care | Payer: BC Managed Care – PPO | Attending: Emergency Medicine | Admitting: Emergency Medicine

## 2013-04-15 ENCOUNTER — Encounter (HOSPITAL_COMMUNITY): Payer: Self-pay | Admitting: Emergency Medicine

## 2013-04-15 ENCOUNTER — Emergency Department (HOSPITAL_COMMUNITY): Payer: BC Managed Care – PPO

## 2013-04-15 DIAGNOSIS — R079 Chest pain, unspecified: Secondary | ICD-10-CM

## 2013-04-15 DIAGNOSIS — Z79899 Other long term (current) drug therapy: Secondary | ICD-10-CM | POA: Insufficient documentation

## 2013-04-15 DIAGNOSIS — M25519 Pain in unspecified shoulder: Secondary | ICD-10-CM | POA: Insufficient documentation

## 2013-04-15 DIAGNOSIS — R0602 Shortness of breath: Secondary | ICD-10-CM | POA: Insufficient documentation

## 2013-04-15 DIAGNOSIS — R002 Palpitations: Secondary | ICD-10-CM | POA: Insufficient documentation

## 2013-04-15 DIAGNOSIS — R42 Dizziness and giddiness: Secondary | ICD-10-CM | POA: Insufficient documentation

## 2013-04-15 DIAGNOSIS — F17200 Nicotine dependence, unspecified, uncomplicated: Secondary | ICD-10-CM | POA: Insufficient documentation

## 2013-04-15 DIAGNOSIS — I10 Essential (primary) hypertension: Secondary | ICD-10-CM | POA: Insufficient documentation

## 2013-04-15 DIAGNOSIS — R0789 Other chest pain: Secondary | ICD-10-CM | POA: Insufficient documentation

## 2013-04-15 LAB — CBC
HCT: 39.1 % (ref 36.0–46.0)
Hemoglobin: 13.7 g/dL (ref 12.0–15.0)
MCH: 31.6 pg (ref 26.0–34.0)
MCHC: 35 g/dL (ref 30.0–36.0)
MCV: 90.3 fL (ref 78.0–100.0)
Platelets: 257 10*3/uL (ref 150–400)
RBC: 4.33 MIL/uL (ref 3.87–5.11)
RDW: 13.1 % (ref 11.5–15.5)
WBC: 8.6 10*3/uL (ref 4.0–10.5)

## 2013-04-15 LAB — I-STAT TROPONIN, ED: TROPONIN I, POC: 0 ng/mL (ref 0.00–0.08)

## 2013-04-15 LAB — BASIC METABOLIC PANEL
BUN: 13 mg/dL (ref 6–23)
CO2: 27 mEq/L (ref 19–32)
Calcium: 9.3 mg/dL (ref 8.4–10.5)
Chloride: 101 mEq/L (ref 96–112)
Creatinine, Ser: 0.47 mg/dL — ABNORMAL LOW (ref 0.50–1.10)
GFR calc Af Amer: >90 mL/min (ref >90)
GLUCOSE: 97 mg/dL (ref 70–99)
POTASSIUM: 3.9 meq/L (ref 3.7–5.3)
SODIUM: 143 meq/L (ref 137–147)

## 2013-04-15 LAB — PRO B NATRIURETIC PEPTIDE: PRO B NATRI PEPTIDE: 81.6 pg/mL (ref 0–125)

## 2013-04-15 NOTE — Discharge Instructions (Signed)

## 2013-04-15 NOTE — ED Notes (Signed)
Pt c/o left sided CP, and Left sided back pain with SOB, lightheadedness, dizziness, SOB, intermittent nausea that started Thursday.

## 2013-04-15 NOTE — ED Provider Notes (Signed)
CSN: [id number]     Arrival date & time 04/15/13  0003 History   First MD Initiated Contact with Patient 04/15/13 0101     Chief Complaint  Patient presents with  . Chest Pain      Patient is a 53 y.o. female presenting with chest pain. The history is provided by the patient.  Chest Pain Chest pain location: left shoulder. Pain quality: sharp   Radiates to: left breast. Pain severity:  Moderate Onset quality:  Sudden Duration:  1 day Timing:  Constant Progression:  Improving Chronicity:  New Relieved by:  Nothing Worsened by:  Certain positions Associated symptoms: dizziness and shortness of breath   Associated symptoms: no abdominal pain, no diaphoresis, no syncope, not vomiting and no weakness   PT reports she had left shoulder pain two days ago (no trauma, no injury) then over past day she has developed pain that radiates into left breast.  She feels palpitations at times.  She reports when pain is severe she feels SOB.  No syncope.  She is now improved.  No focal weakness is reported She reports the pain in shoulder/breast are worse with bending forward  She denies h/o CAD/PE No pleuritic pain is reported   She reports h/o bicuspid aortic valve, but no surgery and no recent f/u with cardiology Prior to the past week, she has not had any recent CP/syncope   Past Medical History  Diagnosis Date  . Hypertension    Past Surgical History  Procedure Laterality Date  . Appendectomy    . Abdominal hysterectomy    . Cholecystectomy     Family History  Problem Relation Age of Onset  . Heart disease Mother   . Heart disease Father    History  Substance Use Topics  . Smoking status: Current Every Day Smoker -- 20.00 packs/day    Types: Cigarettes  . Smokeless tobacco: Never Used  . Alcohol Use: Yes     Comment: sometimes   OB History   Grav Para Term Preterm Abortions TAB SAB Ect Mult Living                 Review of Systems  Constitutional: Negative for  diaphoresis.  Respiratory: Positive for shortness of breath.   Cardiovascular: Positive for chest pain. Negative for syncope.  Gastrointestinal: Negative for vomiting and abdominal pain.  Neurological: Positive for dizziness. Negative for syncope and weakness.  All other systems reviewed and are negative.      Allergies  Review of patient's allergies indicates no known allergies.  Home Medications   Current Outpatient Rx  Name  Route  Sig  Dispense  Refill  . ALPRAZolam (XANAX) 0.5 MG tablet   Oral   Take 0.5 mg by mouth at bedtime as needed for anxiety or sleep.         . bisoprolol-hydrochlorothiazide (ZIAC) 10-6.25 MG per tablet   Oral   Take 1 tablet by mouth daily.   30 tablet   11   . HYDROcodone-acetaminophen (NORCO/VICODIN) 5-325 MG per tablet   Oral   Take 0.5 tablets by mouth every 6 (six) hours as needed for moderate pain.         Marland Kitchen omeprazole (PRILOSEC) 20 MG capsule   Oral   Take 20 mg by mouth daily.          BP 143/98  Pulse 85  Temp(Src) 98.1 F (36.7 C) (Oral)  Resp 18  Ht 5\' 4"  (1.626 m)  Wt 161 lb (  73.029 kg)  BMI 27.62 kg/m2  SpO2 98% Physical Exam CONSTITUTIONAL: Well developed/well nourished, well appearing, using phone and in no distress. HEAD: Normocephalic/atraumatic EYES: EOMI/PERRL ENMT: Mucous membranes moist NECK: supple no meningeal signs SPINE:entire spine nontender CV: S1/S2 noted, no murmurs/rubs/gallops noted LUNGS: Lungs are clear to auscultation bilaterally, no apparent distress ABDOMEN: soft, nontender, no rebound or guarding GU:no cva tenderness NEURO: Pt is awake/alert, moves all extremitiesx4 EXTREMITIES: pulses normal, full ROM, no LE edema or calf tenderness noted She is able to fully range left shoulder without difficulty.  No bruising/erythema noted to left shoulder SKIN: warm, color normal PSYCH: no abnormalities of mood noted  ED Course  Procedures   Pt with left shoulder pain and breast pain, sharp  in nature She is well appearing and in no distress No signs of complication from bicuspid aortic valve ( no murmur), no syncope reported I doubt PE at this time Pain is atypical for ACS.  She is low risk for ACS.  I did suggest having repeat ekg/troponin at 3 hr mark to ensure no dynamic change, but pt refuses at this time.  She would like to go home and f/u with cardiology as outpatient.  I advised need for f/u to have aortic valve re-evaluated. We discussed strict return precautions  Labs Review Labs Reviewed  BASIC METABOLIC PANEL - Abnormal; Notable for the following:    Creatinine, Ser 0.47 (*)    All other components within normal limits  CBC  PRO B NATRIURETIC PEPTIDE  I-STAT TROPOININ, ED   Imaging Review Dg Chest 2 View  04/15/2013   CLINICAL DATA:  Burning pain in the chest, progressing to sharp stabbing left-sided chest pain to the back.  EXAM: CHEST  2 VIEW  COMPARISON:  None.  FINDINGS: The lungs are well-aerated and clear. There is no evidence of focal opacification, pleural effusion or pneumothorax.  The heart is normal in size; the mediastinal contour is within normal limits. No acute osseous abnormalities are seen. Clips are noted within the right upper quadrant, reflecting prior cholecystectomy.  IMPRESSION: No acute cardiopulmonary process seen.   Electronically Signed   By: Roanna RaiderJeffery  Chang M.D.   On: 04/15/2013 00:47     EKG Interpretation   Date/Time:  Sunday April 15 2013 00:17:43 EST Ventricular Rate:  75 PR Interval:  168 QRS Duration: 82 QT Interval:  402 QTC Calculation: 448 R Axis:   65 Text Interpretation:  Normal sinus rhythm Normal ECG Confirmed by Bebe ShaggyWICKLINE   MD, Dorinda HillNALD ([id number]) on 04/15/2013 1:01:57 AM      MDM   Final diagnoses:  Chest pain    Nursing notes including past medical history and social history reviewed and considered in documentation xrays reviewed and considered Labs/vital reviewed and considered     Joya Gaskinsonald W Macall Mccroskey,  MD 04/15/13 [id number]

## 2013-05-08 ENCOUNTER — Other Ambulatory Visit: Payer: Self-pay | Admitting: Emergency Medicine

## 2013-06-17 ENCOUNTER — Other Ambulatory Visit: Payer: Self-pay | Admitting: Physician Assistant

## 2013-07-03 ENCOUNTER — Telehealth: Payer: Self-pay

## 2013-07-03 ENCOUNTER — Ambulatory Visit (INDEPENDENT_AMBULATORY_CARE_PROVIDER_SITE_OTHER): Payer: BC Managed Care – PPO | Admitting: Emergency Medicine

## 2013-07-03 ENCOUNTER — Other Ambulatory Visit: Payer: Self-pay | Admitting: Physician Assistant

## 2013-07-03 VITALS — BP 126/74 | HR 74 | Temp 97.8°F | Resp 18 | Ht 64.0 in | Wt 160.2 lb

## 2013-07-03 DIAGNOSIS — R252 Cramp and spasm: Secondary | ICD-10-CM

## 2013-07-03 DIAGNOSIS — R112 Nausea with vomiting, unspecified: Secondary | ICD-10-CM

## 2013-07-03 DIAGNOSIS — A088 Other specified intestinal infections: Secondary | ICD-10-CM

## 2013-07-03 DIAGNOSIS — R110 Nausea: Secondary | ICD-10-CM

## 2013-07-03 DIAGNOSIS — R197 Diarrhea, unspecified: Secondary | ICD-10-CM

## 2013-07-03 LAB — POCT UA - MICROSCOPIC ONLY
Bacteria, U Microscopic: NEGATIVE
CASTS, UR, LPF, POC: NEGATIVE
CRYSTALS, UR, HPF, POC: NEGATIVE
Epithelial cells, urine per micros: NEGATIVE
Mucus, UA: NEGATIVE
WBC, Ur, HPF, POC: NEGATIVE
Yeast, UA: NEGATIVE

## 2013-07-03 LAB — POCT URINALYSIS DIPSTICK
Bilirubin, UA: NEGATIVE
Glucose, UA: NEGATIVE
Ketones, UA: NEGATIVE
LEUKOCYTES UA: NEGATIVE
NITRITE UA: NEGATIVE
PH UA: 6
PROTEIN UA: NEGATIVE
Spec Grav, UA: 1.02
Urobilinogen, UA: 0.2

## 2013-07-03 LAB — POCT CBC
GRANULOCYTE PERCENT: 82.2 % — AB (ref 37–80)
HEMATOCRIT: 42.8 % (ref 37.7–47.9)
HEMOGLOBIN: 14 g/dL (ref 12.2–16.2)
Lymph, poc: 1.5 (ref 0.6–3.4)
MCH, POC: 30.8 pg (ref 27–31.2)
MCHC: 32.7 g/dL (ref 31.8–35.4)
MCV: 94.1 fL (ref 80–97)
MID (cbc): 0.5 (ref 0–0.9)
MPV: 9.3 fL (ref 0–99.8)
POC Granulocyte: 9.1 — AB (ref 2–6.9)
POC LYMPH PERCENT: 13.4 %L (ref 10–50)
POC MID %: 4.4 %M (ref 0–12)
Platelet Count, POC: 275 10*3/uL (ref 142–424)
RBC: 4.55 M/uL (ref 4.04–5.48)
RDW, POC: 13.1 %
WBC: 11.1 10*3/uL — AB (ref 4.6–10.2)

## 2013-07-03 MED ORDER — LOPERAMIDE HCL 2 MG PO TABS: ORAL_TABLET | ORAL | Status: DC

## 2013-07-03 MED ORDER — ONDANSETRON 4 MG PO TBDP: 8.0000 mg | ORAL_TABLET | Freq: Once | ORAL | Status: DC

## 2013-07-03 MED ORDER — BISOPROLOL-HYDROCHLOROTHIAZIDE 10-6.25 MG PO TABS: 1.0000 | ORAL_TABLET | Freq: Every day | ORAL | Status: DC

## 2013-07-03 MED ORDER — ALPRAZOLAM 0.5 MG PO TABS: 0.5000 mg | ORAL_TABLET | Freq: Every evening | ORAL | Status: DC | PRN

## 2013-07-03 MED ORDER — ONDANSETRON 8 MG PO TBDP: 8.0000 mg | ORAL_TABLET | Freq: Three times a day (TID) | ORAL | Status: DC | PRN

## 2013-07-03 MED ORDER — PROMETHAZINE HCL 12.5 MG PO TABS: 12.5000 mg | ORAL_TABLET | Freq: Three times a day (TID) | ORAL | Status: DC | PRN

## 2013-07-03 NOTE — Telephone Encounter (Signed)
PT WOULD LIKE TO SPEAK WITH A NURSE, STATES SHE HAVE A VIRUS AND DOESN'T WANT TO COME AND SIT IN THE WAITING ROOM. PLEASE CALL [phone]

## 2013-07-03 NOTE — Progress Notes (Signed)
[facility] [address], [place] [id number] [phone]  Date:  07/03/2013   Name:  Mary SermonSusan M Wade   DOB:  06-10-1960   MRN:  [id number]  PCP:  Rockne CoonsLE, THAO PHUONG, DO    Chief Complaint: Nausea, Diarrhea and fett and legs tinglingx 1 day   History of Present Illness:  Mary SermonSusan M Wade is a 53 y.o. very pleasant female patient who presents with the following:  Awoke this morning well and developed nausea on her way to work.  And then started with diarrhea.  The patient has no complaint of blood, mucous, or pus in her stools. Has vomited three times this morning.  No fever or chills.  No sick contacts.  Works as a Sales executivedental assistant. Has increasing night cramps in legs.  No claudication.  Smokes a pack a day.  HBP and HLD.  Did not tolerate anti lipid drugs.  This pain is accelerating and was so severe last night she could not sleep.  No improvement with over the counter medications or other home remedies. Denies other complaint or health concern today.   There are no active problems to display for this patient.   Past Medical History  Diagnosis Date  . Hypertension     Past Surgical History  Procedure Laterality Date  . Appendectomy    . Abdominal hysterectomy    . Cholecystectomy      History  Substance Use Topics  . Smoking status: Current Every Day Smoker -- 20.00 packs/day    Types: Cigarettes  . Smokeless tobacco: Never Used  . Alcohol Use: Yes     Comment: sometimes    Family History  Problem Relation Age of Onset  . Heart disease Mother   . Heart disease Father     No Known Allergies  Medication list has been reviewed and updated.  Current Outpatient Prescriptions on File Prior to Visit  Medication Sig Dispense Refill  . ALPRAZolam (XANAX) 0.5 MG tablet Take 0.5 mg by mouth at bedtime as needed for anxiety or sleep.      . bisoprolol-hydrochlorothiazide (ZIAC) 10-6.25 MG per tablet Take 1 tablet by mouth daily. PATIENT NEEDS OFFICE VISIT  FOR ADDITIONAL REFILLS - 2nd NOTICE  15 tablet  0  . omeprazole (PRILOSEC) 20 MG capsule Take 20 mg by mouth daily.      Marland Kitchen. HYDROcodone-acetaminophen (NORCO/VICODIN) 5-325 MG per tablet Take 0.5 tablets by mouth every 6 (six) hours as needed for moderate pain.       Current Facility-Administered Medications on File Prior to Visit  Medication Dose Route Frequency Provider Last Rate Last Dose  . promethazine (PHENERGAN) injection 25 mg  25 mg Intramuscular Q6H PRN Lamar LaundryLaura C Mayans, MD   25 mg at 06/03/11 1856    Review of Systems:  As per HPI, otherwise negative.    Physical Examination: Filed Vitals:   07/03/13 1157  BP: 126/74  Pulse: 74  Temp: 97.8 F (36.6 C)  Resp: 18   Filed Vitals:   07/03/13 1157  Height: 5\' 4"  (1.626 m)  Weight: 160 lb 3.2 oz (72.666 kg)   Body mass index is 27.48 kg/(m^2). Ideal Body Weight: Weight in (lb) to have BMI = 25: 145.3  GEN: WDWN, NAD, Non-toxic, A & O x 3 HEENT: Atraumatic, Normocephalic. Neck supple. No masses, No LAD. Ears and Nose: No external deformity. CV: RRR, No M/G/R. No JVD. No thrill. No extra heart sounds.  Faint DP and PT, absent popliteal,  bounding femorals PULM: CTA B, no wheezes, crackles, rhonchi. No retractions. No resp. distress. No accessory muscle use. ABD: S, epigastric tender, ND, +BS. No rebound. No HSM. EXTR: No c/c/e NEURO Normal gait.  PSYCH: Normally interactive. Conversant. Not depressed or anxious appearing.  Calm demeanor.    Assessment and Plan: Viral gastroenteritis Hypertension Night cramps consider potassium, PVD won't stop smoking. Will get noninvasive arterial study Labs Imodium zofran Follow up with Dr Cleta Albertsaub  Signed,  Phillips OdorJeffery Kellon Chalk, MD   Results for orders placed in visit on 07/03/13  POCT CBC      Result Value Ref Range   WBC 11.1 (*) 4.6 - 10.2 K/uL   Lymph, poc 1.5  0.6 - 3.4   POC LYMPH PERCENT 13.4  10 - 50 %L   MID (cbc) 0.5  0 - 0.9   POC MID % 4.4  0 - 12 %M   POC  Granulocyte 9.1 (*) 2 - 6.9   Granulocyte percent 82.2 (*) 37 - 80 %G   RBC 4.55  4.04 - 5.48 M/uL   Hemoglobin 14.0  12.2 - 16.2 g/dL   HCT, POC 96.042.8  45.437.7 - 47.9 %   MCV 94.1  80 - 97 fL   MCH, POC 30.8  27 - 31.2 pg   MCHC 32.7  31.8 - 35.4 g/dL   RDW, POC 09.813.1     Platelet Count, POC 275  142 - 424 K/uL   MPV 9.3  0 - 99.8 fL  POCT URINALYSIS DIPSTICK      Result Value Ref Range   Color, UA yellow     Clarity, UA clear     Glucose, UA neg     Bilirubin, UA neg     Ketones, UA neg     Spec Grav, UA 1.020     Blood, UA small     pH, UA 6.0     Protein, UA neg     Urobilinogen, UA 0.2     Nitrite, UA neg     Leukocytes, UA Negative    POCT UA - MICROSCOPIC ONLY      Result Value Ref Range   WBC, Ur, HPF, POC neg     RBC, urine, microscopic 1-3     Bacteria, U Microscopic neg     Mucus, UA neg     Epithelial cells, urine per micros neg     Crystals, Ur, HPF, POC neg     Casts, Ur, LPF, POC neg     Yeast, UA neg

## 2013-07-03 NOTE — Telephone Encounter (Signed)
Patient came in for OV 

## 2013-07-03 NOTE — Patient Instructions (Signed)
Viral Gastroenteritis Viral gastroenteritis is also known as stomach flu. This condition affects the stomach and intestinal tract. It can cause sudden diarrhea and vomiting. The illness typically lasts 3 to 8 days. Most people develop an immune response that eventually gets rid of the virus. While this natural response develops, the virus can make you quite ill. CAUSES  Many different viruses can cause gastroenteritis, such as rotavirus or noroviruses. You can catch one of these viruses by consuming contaminated food or water. You may also catch a virus by sharing utensils or other personal items with an infected person or by touching a contaminated surface. SYMPTOMS  The most common symptoms are diarrhea and vomiting. These problems can cause a severe loss of body fluids (dehydration) and a body salt (electrolyte) imbalance. Other symptoms may include:  Fever.  Headache.  Fatigue.  Abdominal pain. DIAGNOSIS  Your caregiver can usually diagnose viral gastroenteritis based on your symptoms and a physical exam. A stool sample may also be taken to test for the presence of viruses or other infections. TREATMENT  This illness typically goes away on its own. Treatments are aimed at rehydration. The most serious cases of viral gastroenteritis involve vomiting so severely that you are not able to keep fluids down. In these cases, fluids must be given through an intravenous line (IV). HOME CARE INSTRUCTIONS   Drink enough fluids to keep your urine clear or pale yellow. Drink small amounts of fluids frequently and increase the amounts as tolerated.  Ask your caregiver for specific rehydration instructions.  Avoid:  Foods high in sugar.  Alcohol.  Carbonated drinks.  Tobacco.  Juice.  Caffeine drinks.  Extremely hot or cold fluids.  Fatty, greasy foods.  Too much intake of anything at one time.  Dairy products until 24 to 48 hours after diarrhea stops.  You may consume probiotics.  Probiotics are active cultures of beneficial bacteria. They may lessen the amount and number of diarrheal stools in adults. Probiotics can be found in yogurt with active cultures and in supplements.  Wash your hands well to avoid spreading the virus.  Only take over-the-counter or prescription medicines for pain, discomfort, or fever as directed by your caregiver. Do not give aspirin to children. Antidiarrheal medicines are not recommended.  Ask your caregiver if you should continue to take your regular prescribed and over-the-counter medicines.  Keep all follow-up appointments as directed by your caregiver. SEEK IMMEDIATE MEDICAL CARE IF:   You are unable to keep fluids down.  You do not urinate at least once every 6 to 8 hours.  You develop shortness of breath.  You notice blood in your stool or vomit. This may look like coffee grounds.  You have abdominal pain that increases or is concentrated in one small area (localized).  You have persistent vomiting or diarrhea.  You have a fever.  The patient is a child younger than 3 months, and he or she has a fever.  The patient is a child older than 3 months, and he or she has a fever and persistent symptoms.  The patient is a child older than 3 months, and he or she has a fever and symptoms suddenly get worse.  The patient is a baby, and he or she has no tears when crying. MAKE SURE YOU:   Understand these instructions.  Will watch your condition.  Will get help right away if you are not doing well or get worse. Document Released: 02/01/2005 Document Revised: 04/26/2011 Document Reviewed: 11/18/2010   ExitCare Patient Information 2014 [employer]. Diet The clear liquid diet consists of foods that are liquid or will become liquid at room temperature. Examples of foods allowed on a clear liquid diet include fruit juice, broth or bouillon, gelatin, or frozen ice pops. You should be able to see through the liquid. The purpose of  this diet is to provide the necessary fluids, electrolytes (such as sodium and potassium), and energy to keep the body functioning during times when you are not able to consume a regular diet. A clear liquid diet should not be continued for long periods of time, as it is not nutritionally adequate.  A CLEAR LIQUID DIET MAY BE NEEDED:  When a sudden-onset (acute) condition occurs before or after surgery.   As the first step in oral feeding.   For fluid and electrolyte replacement in diarrheal diseases.   As a diet before certain medical tests are performed.  ADEQUACY The clear liquid diet is adequate only in ascorbic acid, according to the Recommended Dietary Allowances of the National Research Council.  CHOOSING FOODS Breads and Starches  Allowed: None are allowed.   Avoid: All are to be avoided.  Vegetables  Allowed: Strained vegetable juices.   Avoid: Any others.  Fruit  Allowed: Strained fruit juices and fruit drinks. Include 1 serving of citrus or vitamin C-enriched fruit juice daily.   Avoid: Any others.  Meat and Meat Substitutes  Allowed: None are allowed.   Avoid: All are to be avoided.  Milk Products  Allowed: None are allowed.   Avoid: All are to be avoided.  Soups and Combination Foods  Allowed: Clear bouillon, broth, or strained broth-based soups.   Avoid: Any others.  Desserts and Sweets  Allowed: Sugar, honey. High-protein gelatin. Flavored gelatin, ices, or frozen ice pops that do not contain milk.   Avoid: Any others.  Fats and Oils  Allowed: None are allowed.   Avoid: All are to be avoided.  Beverages  Allowed: Cereal beverages, coffee (regular or decaffeinated), tea, or soda at the discretion of your health care provider.   Avoid: Any others.  Condiments  Allowed: Salt.   Avoid: Any others, including pepper.  Supplements  Allowed: Liquid nutrition beverages that you can see  through.   Avoid: Any others that contain lactose or fiber. SAMPLE MEAL PLAN Breakfast  4 oz (120 mL) strained orange juice.   to 1 cup (120 to 240 mL) gelatin (plain or fortified).  1 cup (240 mL) beverage (coffee or tea).  Sugar, if desired. Midmorning Snack   cup (120 mL) gelatin (plain or fortified). Lunch  1 cup (240 mL) broth or consomm.  4 oz (120 mL) strained grapefruit juice.   cup (120 mL) gelatin (plain or fortified).  1 cup (240 mL) beverage (coffee or tea).  Sugar, if desired. Midafternoon Snack   cup (120 mL) fruit ice.   cup (120 mL) strained fruit juice. Dinner  1 cup (240 mL) broth or consomm.   cup (120 mL) cranberry juice.   cup (120 mL) flavored gelatin (plain or fortified).  1 cup (240 mL) beverage (coffee or tea).  Sugar, if desired. Evening Snack  4 oz (120 mL) strained apple juice (vitamin C-fortified).   cup (120 mL) flavored gelatin (plain or fortified). MAKE SURE YOU:  Understand these instructions.  Will watch your child's condition.  Will get help right away if your child is not doing well or gets worse. Document Released: 02/01/2005 Document Revised: 10/04/2012 Document Reviewed: 07/04/2012   ExitCare Patient Information 2014 [employer].  

## 2013-07-04 LAB — COMPREHENSIVE METABOLIC PANEL
ALT: 27 U/L (ref 0–35)
AST: 29 U/L (ref 0–37)
Albumin: 4.3 g/dL (ref 3.5–5.2)
Alkaline Phosphatase: 66 U/L (ref 39–117)
BUN: 18 mg/dL (ref 6–23)
CALCIUM: 9.3 mg/dL (ref 8.4–10.5)
CHLORIDE: 100 meq/L (ref 96–112)
CO2: 28 meq/L (ref 19–32)
Creat: 0.54 mg/dL (ref 0.50–1.10)
GLUCOSE: 91 mg/dL (ref 70–99)
POTASSIUM: 4.3 meq/L (ref 3.5–5.3)
Sodium: 138 mEq/L (ref 135–145)
TOTAL PROTEIN: 6.9 g/dL (ref 6.0–8.3)
Total Bilirubin: 0.6 mg/dL (ref 0.2–1.2)

## 2013-07-04 LAB — AMYLASE: AMYLASE: 26 U/L (ref 0–105)

## 2013-07-04 LAB — LIPASE: LIPASE: 24 U/L (ref 0–75)

## 2013-08-03 ENCOUNTER — Encounter (HOSPITAL_COMMUNITY): Payer: BC Managed Care – PPO

## 2013-08-09 ENCOUNTER — Ambulatory Visit (HOSPITAL_COMMUNITY): Payer: BC Managed Care – PPO | Attending: Internal Medicine | Admitting: Cardiology

## 2013-08-09 DIAGNOSIS — F17200 Nicotine dependence, unspecified, uncomplicated: Secondary | ICD-10-CM | POA: Insufficient documentation

## 2013-08-09 DIAGNOSIS — I10 Essential (primary) hypertension: Secondary | ICD-10-CM | POA: Insufficient documentation

## 2013-08-09 DIAGNOSIS — R252 Cramp and spasm: Secondary | ICD-10-CM | POA: Insufficient documentation

## 2013-08-09 DIAGNOSIS — I739 Peripheral vascular disease, unspecified: Secondary | ICD-10-CM | POA: Insufficient documentation

## 2013-08-09 NOTE — Progress Notes (Signed)
LEA Doppler performed 

## 2013-09-28 ENCOUNTER — Ambulatory Visit (INDEPENDENT_AMBULATORY_CARE_PROVIDER_SITE_OTHER): Payer: BC Managed Care – PPO | Admitting: Cardiology

## 2013-09-28 ENCOUNTER — Encounter: Payer: Self-pay | Admitting: Cardiology

## 2013-09-28 ENCOUNTER — Encounter: Payer: Self-pay | Admitting: *Deleted

## 2013-09-28 VITALS — BP 128/68 | HR 65 | Ht 64.0 in | Wt 160.5 lb

## 2013-09-28 DIAGNOSIS — I359 Nonrheumatic aortic valve disorder, unspecified: Secondary | ICD-10-CM

## 2013-09-28 DIAGNOSIS — E785 Hyperlipidemia, unspecified: Secondary | ICD-10-CM

## 2013-09-28 DIAGNOSIS — F17200 Nicotine dependence, unspecified, uncomplicated: Secondary | ICD-10-CM

## 2013-09-28 DIAGNOSIS — E782 Mixed hyperlipidemia: Secondary | ICD-10-CM | POA: Insufficient documentation

## 2013-09-28 DIAGNOSIS — I152 Hypertension secondary to endocrine disorders: Secondary | ICD-10-CM | POA: Insufficient documentation

## 2013-09-28 DIAGNOSIS — R002 Palpitations: Secondary | ICD-10-CM

## 2013-09-28 DIAGNOSIS — I10 Essential (primary) hypertension: Secondary | ICD-10-CM

## 2013-09-28 DIAGNOSIS — Q231 Congenital insufficiency of aortic valve: Secondary | ICD-10-CM

## 2013-09-28 DIAGNOSIS — E1169 Type 2 diabetes mellitus with other specified complication: Secondary | ICD-10-CM | POA: Insufficient documentation

## 2013-09-28 DIAGNOSIS — Z720 Tobacco use: Secondary | ICD-10-CM | POA: Insufficient documentation

## 2013-09-28 DIAGNOSIS — E1159 Type 2 diabetes mellitus with other circulatory complications: Secondary | ICD-10-CM | POA: Insufficient documentation

## 2013-09-28 NOTE — Addendum Note (Signed)
Addended by: Freddi StarrMATHIS, Allante Whitmire W on: 09/28/2013 03:03 PM   Modules accepted: Orders

## 2013-09-28 NOTE — Assessment & Plan Note (Signed)
Patient counseled on discontinuing. 

## 2013-09-28 NOTE — Progress Notes (Signed)
     HPI: 53 year old female with past medical history of bicuspid aortic valve for evaluation of palpitations. Patient denies dyspnea on exertion, orthopnea, PND, pedal edema, exertional chest pain or syncope. She has had intermittent palpitations for several years. They are described as heart racing with sudden onset. There is associated dizziness and dyspnea but no chest pain. No syncope. Typically lasts 5-10 minutes and resolve spontaneously.  Current Outpatient Prescriptions  Medication Sig Dispense Refill  . ALPRAZolam (XANAX) 0.5 MG tablet Take 1 tablet (0.5 mg total) by mouth at bedtime as needed for anxiety or sleep.  30 tablet  0  . bisoprolol-hydrochlorothiazide (ZIAC) 10-6.25 MG per tablet Take 1 tablet by mouth daily.  30 tablet  2  . HYDROcodone-acetaminophen (NORCO/VICODIN) 5-325 MG per tablet Take 0.5 tablets by mouth every 6 (six) hours as needed for moderate pain.      Marland Kitchen. omeprazole (PRILOSEC) 20 MG capsule Take 20 mg by mouth daily.      . promethazine (PHENERGAN) 12.5 MG tablet Take 1 tablet (12.5 mg total) by mouth every 8 (eight) hours as needed for nausea or vomiting.  20 tablet  0   Current Facility-Administered Medications  Medication Dose Route Frequency Provider Last Rate Last Dose  . promethazine (PHENERGAN) injection 25 mg  25 mg Intramuscular Q6H PRN Lamar LaundryLaura C Mayans, MD   25 mg at 06/03/11 1856    No Known Allergies  Past Medical History  Diagnosis Date  . Hypertension   . Bicuspid aortic valve   . Pancreatitis   . Hyperlipidemia     Past Surgical History  Procedure Laterality Date  . Appendectomy    . Abdominal hysterectomy    . Cholecystectomy      History   Social History  . Marital Status: Single    Spouse Name: N/A    Number of Children: 2  . Years of Education: N/A   Occupational History  .      Dental office   Social History Main Topics  . Smoking status: Current Every Day Smoker -- 0.50 packs/day    Types: Cigarettes  . Smokeless  tobacco: Never Used  . Alcohol Use: Yes     Comment: sometimes  . Drug Use: No  . Sexual Activity: Yes   Other Topics Concern  . Not on file   Social History Narrative  . No narrative on file    Family History  Problem Relation Age of Onset  . Heart disease Mother   . Heart disease Father     Died of MI at age 52    ROS: no fevers or chills, productive cough, hemoptysis, dysphasia, odynophagia, melena, hematochezia, dysuria, hematuria, rash, seizure activity, orthopnea, PND, pedal edema, claudication. Remaining systems are negative.  Physical Exam:   Blood pressure 128/68, pulse 65, height 5\' 4"  (1.626 m), weight 160 lb 8 oz (72.802 kg).  General:  Well developed/well nourished in NAD Skin warm/dry Patient not depressed No peripheral clubbing Back-normal HEENT-normal/normal eyelids Neck supple/normal carotid upstroke bilaterally; no bruits; no JVD; no thyromegaly chest - CTA/ normal expansion CV - RRR/normal S1 and S2; no murmurs, rubs or gallops;  PMI nondisplaced Abdomen -NT/ND, no HSM, no mass, + bowel sounds, no bruit 2+ femoral pulses, no bruits Ext-no edema, chords, 2+ DP Neuro-grossly nonfocal  ECG NSR, no ST changes

## 2013-09-28 NOTE — Assessment & Plan Note (Signed)
Blood pressure controlled. Continue present medications. 

## 2013-09-28 NOTE — Assessment & Plan Note (Signed)
Repeat echocardiogram. 

## 2013-09-28 NOTE — Patient Instructions (Signed)
Your physician recommends that you schedule a follow-up appointment in: 8-12 WEEKS WITH DR Jens SomRENSHAW  Your physician has requested that you have an echocardiogram. Echocardiography is a painless test that uses sound waves to create images of your heart. It provides your doctor with information about the size and shape of your heart and how well your heart's chambers and valves are working. This procedure takes approximately one hour. There are no restrictions for this procedure.[facility]  Your physician has recommended that you wear an event monitor. Event monitors are medical devices that record the heart's electrical activity. Doctors most often us these monitors to diagnose arrhythmias. Arrhythmias are problems with the speed or rhythm of the heartbeat. The monitor is a small, portable device. You can wear one while you do your normal daily activities. This is usually used to diagnose what is causing palpitations/syncope (passing out).   Your physician recommends that you return for lab work WITH ECHOCARDIOGRAM

## 2013-09-28 NOTE — Assessment & Plan Note (Signed)
Check lipids 

## 2013-09-28 NOTE — Assessment & Plan Note (Signed)
Schedule CardioNet to further assess. 

## 2013-10-03 ENCOUNTER — Other Ambulatory Visit: Payer: Self-pay | Admitting: Emergency Medicine

## 2013-10-23 ENCOUNTER — Encounter (INDEPENDENT_AMBULATORY_CARE_PROVIDER_SITE_OTHER): Payer: BC Managed Care – PPO

## 2013-10-23 ENCOUNTER — Other Ambulatory Visit (INDEPENDENT_AMBULATORY_CARE_PROVIDER_SITE_OTHER): Payer: BC Managed Care – PPO

## 2013-10-23 ENCOUNTER — Encounter: Payer: Self-pay | Admitting: *Deleted

## 2013-10-23 ENCOUNTER — Other Ambulatory Visit (HOSPITAL_COMMUNITY): Payer: BC Managed Care – PPO

## 2013-10-23 DIAGNOSIS — Q231 Congenital insufficiency of aortic valve: Secondary | ICD-10-CM

## 2013-10-23 DIAGNOSIS — R002 Palpitations: Secondary | ICD-10-CM

## 2013-10-23 DIAGNOSIS — I359 Nonrheumatic aortic valve disorder, unspecified: Secondary | ICD-10-CM

## 2013-10-23 LAB — LIPID PANEL
CHOLESTEROL: 307 mg/dL — AB (ref 0–200)
HDL: 31.1 mg/dL — ABNORMAL LOW (ref >39.00)
NONHDL: 275.9
Total CHOL/HDL Ratio: 10
Triglycerides: 804 mg/dL — ABNORMAL HIGH (ref 0.0–149.0)
VLDL: 160.8 mg/dL — ABNORMAL HIGH (ref 0.0–40.0)

## 2013-10-23 LAB — LDL CHOLESTEROL, DIRECT: Direct LDL: 114.9 mg/dL

## 2013-10-23 LAB — TSH: TSH: 0.66 u[IU]/mL (ref 0.35–4.50)

## 2013-10-23 NOTE — Progress Notes (Signed)
Patient ID: Mary Wade, female   DOB: November 10, 1960, 53 y.o.   MRN: [id number] E-Cardio verite 30 day cardiac event monitor applied to patient.

## 2013-10-24 ENCOUNTER — Ambulatory Visit (HOSPITAL_COMMUNITY): Payer: BC Managed Care – PPO | Attending: Cardiology | Admitting: Radiology

## 2013-10-24 DIAGNOSIS — I359 Nonrheumatic aortic valve disorder, unspecified: Secondary | ICD-10-CM | POA: Diagnosis not present

## 2013-10-24 DIAGNOSIS — Q231 Congenital insufficiency of aortic valve: Secondary | ICD-10-CM

## 2013-10-24 DIAGNOSIS — I10 Essential (primary) hypertension: Secondary | ICD-10-CM

## 2013-10-24 DIAGNOSIS — E785 Hyperlipidemia, unspecified: Secondary | ICD-10-CM | POA: Insufficient documentation

## 2013-10-24 DIAGNOSIS — I059 Rheumatic mitral valve disease, unspecified: Secondary | ICD-10-CM | POA: Diagnosis not present

## 2013-10-24 DIAGNOSIS — I079 Rheumatic tricuspid valve disease, unspecified: Secondary | ICD-10-CM | POA: Diagnosis not present

## 2013-10-24 DIAGNOSIS — Z720 Tobacco use: Secondary | ICD-10-CM

## 2013-10-24 DIAGNOSIS — F17200 Nicotine dependence, unspecified, uncomplicated: Secondary | ICD-10-CM | POA: Insufficient documentation

## 2013-10-24 DIAGNOSIS — R002 Palpitations: Secondary | ICD-10-CM

## 2013-10-24 NOTE — Progress Notes (Signed)
Echocardiogram performed.  

## 2013-10-30 ENCOUNTER — Encounter: Payer: Self-pay | Admitting: Cardiology

## 2013-10-30 NOTE — Telephone Encounter (Signed)
New Message  Pt called to discuss ECHO and lab results

## 2013-11-02 ENCOUNTER — Ambulatory Visit (INDEPENDENT_AMBULATORY_CARE_PROVIDER_SITE_OTHER): Payer: BC Managed Care – PPO | Admitting: Pharmacist

## 2013-11-02 VITALS — Wt 161.0 lb

## 2013-11-02 DIAGNOSIS — Z79899 Other long term (current) drug therapy: Secondary | ICD-10-CM

## 2013-11-02 DIAGNOSIS — E785 Hyperlipidemia, unspecified: Secondary | ICD-10-CM

## 2013-11-02 MED ORDER — FISH OIL 1000 MG PO CAPS: 2000.0000 mg | ORAL_CAPSULE | Freq: Two times a day (BID) | ORAL | Status: DC

## 2013-11-02 MED ORDER — FENOFIBRATE 160 MG PO TABS: 160.0000 mg | ORAL_TABLET | Freq: Every day | ORAL | Status: DC

## 2013-11-02 NOTE — Telephone Encounter (Signed)
This encounter was created in error - please disregard.

## 2013-11-02 NOTE — Patient Instructions (Addendum)
Plan: 1. Stop red yeast rice today.  Start generic fenofibrate 160 mg once daily with food. 2.  Increase fish oil to 4 capsules daily.  Put in refrigerator to cut down on fishy aftertaste.  Can take all 4 at once, or do 2 AM, 2 PM.  Take with food. 3.  Diet changes:   Breakfast:  Change from regular eggs to egg whites.  Try to do oatmeal with fruit at least 3-4 days of the week.  Okay to do your usual the other days if needed, but at least avoid yellow (yolk) of the egg.  If possible, stop the bacon, egg, cheese all together. Lunch:  Avoid red meats and fried foods.  Prefer fish, chicken, or salads.  Salmon and tuna preferred fish products. Dinner:  Cooking at home preferred. 4.  Drink:  2-4 beers per week is okay.  Try to increase water consumption.  Reduce diet soda / tea to no more than 3 per day. 5.  If need estrogen supplement in future, need to use a patch form (Climara or Vivelle Dot) 6.  Start walking  - Industrial/product designer App for free - goal 10,000 steps per day.  Or, get a bicycle and ride 3-5 days per week. 7.  Recheck cholesterol and liver in 8 weeks (12/26/13 - fasting labs - lab opens at 7:30 am), and see Audrie Lia on 12/28/13 at 8:30 am  If problems call Filbert Schilder at [phone]

## 2013-11-02 NOTE — Progress Notes (Signed)
Patient is a 53 y.o. WM referred to lipid clinic by Dr. Jens Som given h/o hypertriglyceridemia.  Her TG were 804 mg/dL earlier this month.  Patient is currently on Red yeast rice and fish oil 1,000 mg once daily.  She has failed pravastatin (muscle aches) and niacin (flushing) in the past.  She was prescribed fenofibrate once in the past, but never took it.  She did have an episode of pancreatitis in the past ~ 05/2011.  She has a family history of premature CAD with her father having an MI at 30 y.o.  Neither parent had diabetes or pancreatitis in the past.  Her glucose and TSH are normal. Her diet is admittedly terrible, and needs to make significant changes.  She doesn't exercise at all, but plans on going to [place] and purchasing a bike to start riding.  She does drink 2-4 beers per week, which is much less than what she use to drink.  She previously drank 2-4 beers per day, but 6 months ago reduced to 2-4 per week.  RF:  Family h/o premature CAD (father), tobacco use, HTN, metabolic syndrome - LDL goal < 100, non-HDL goal < 130 Meds:  Fish oil 1 g/d, Red Yeast Rice qd Intolerant:  Niacin (flushing), pravastatin 10 mg qd (muscle aches)  Diet:   Breakfast:  Eats out at restaurant with boyfriend 7 days per week for breakfast.  Eats bacon, egg, cheese on toast with coffee.  Then splits fried potatoes with onions with him as well for breakfast.  Coffee x 3 cups. Lunch:  Eats out at [employer] with girlfriends 7 days per week for lunch.  Eats either a salad or hamburger typically.  Drinks sweet tea or diet Pepsi (4-6 per day on average) Dinner:  Cooks at home 75% of time, and eats out 25% of time.  Typically meat and vegetable in evening time. Snack:  Candy bar, peanuts, or breakfast bar b/t lunch and dinner. Rarely eats a dessert.  Exercise:  No regular exercise, however plans on buying a bike from [place] this weekend.  Agrees to bike or walk a few days of the week. Social history:  Smokes 1/2 ppd  (quit in the past, but restarted due to stress); drinks 2-4 beers per week now  Labs: 10/2013:  TC 307, TG 804, HDL 31, LDL 115, non-HDL 276 (goal < 130 mg/dL), TSH normal (red yeast rice, fish oil 1 g/d) 06/2013:  Glucose normal, LFTs normal  Current Outpatient Prescriptions  Medication Sig Dispense Refill  . Omega-3 Fatty Acids (FISH OIL) 1000 MG CAPS Take 1,000 mg by mouth.      . ALPRAZolam (XANAX) 0.5 MG tablet Take 1 tablet (0.5 mg total) by mouth at bedtime as needed for anxiety or sleep.  30 tablet  0  . bisoprolol-hydrochlorothiazide (ZIAC) 10-6.25 MG per tablet TAKE 1 TABLET BY MOUTH DAILY.  30 tablet  2  . HYDROcodone-acetaminophen (NORCO/VICODIN) 5-325 MG per tablet Take 0.5 tablets by mouth every 6 (six) hours as needed for moderate pain.      Marland Kitchen omeprazole (PRILOSEC) 20 MG capsule Take 20 mg by mouth daily.      . promethazine (PHENERGAN) 12.5 MG tablet Take 1 tablet (12.5 mg total) by mouth every 8 (eight) hours as needed for nausea or vomiting.  20 tablet  0   Current Facility-Administered Medications  Medication Dose Route Frequency Provider Last Rate Last Dose  . promethazine (PHENERGAN) injection 25 mg  25 mg Intramuscular Q6H PRN Lamar Laundry,  MD   25 mg at 06/03/11 1856   Allergies  Allergen Reactions  . Niacin And Related     flush  . Pravastatin     Muscle aches   Family History  Problem Relation Age of Onset  . Heart disease Mother   . Heart disease Father     Died of MI at age 54

## 2013-11-02 NOTE — Assessment & Plan Note (Addendum)
Patient's hypertriglyceridemia is likely secondary to a mixture of both poor diet and a genetic component as her glucose and TSH are normal.  Patient acknowledges that her diet is horrible, but struggles as her social life resolves around eating out.  Patient agrees to make significant dietary changes which we discussed in clinic today.  Will try to slowly improve diet as opposed to changes this entirely today. She is to stop red yeast rice today.  Will increase fish oil to 4 g/d, and also add fenofibrate 160 mg qd given TG > 800 mg/dL.  Hopefully addition of exercise will help get TG to < 200 mg/dL in the future.  She is not on HRT at this time, but does state she gets hot flashes and wants to know what to recommend if she needs something from PCP or GYN in future.  Advised to avoid oral HRT, but that patch (i.e. Vivelle Dot) would be okay.  Will recheck lipid / liver in 8 weeks and f/u with pharmacist 2 days later.  She will call if problems. Plan: 1.  Stop red yeast rice today.  Start generic fenofibrate 160 mg once daily with food. 2.  Increase fish oil to 4 capsules daily.  Put in refrigerator to cut down on fishy aftertaste.  Can take all 4 at once, or do 2 AM, 2 PM.  Take with food. 3.  Diet changes:   Breakfast:  Change from regular eggs to egg whites.  Try to do oatmeal with fruit at least 3-4 days of the week.  Okay to do your usual the other days if needed, but at least avoid yellow (yolk) of the egg.  If possible, stop the bacon, egg, cheese all together. Lunch:  Avoid red meats and fried foods.  Prefer fish, chicken, or salads.  Salmon and tuna preferred fish products. Dinner:  Cooking at home preferred. 4.  Drink:  2-4 beers per week is okay.  Try to increase water consumption.  Reduce diet soda / tea to no more than 3 per day. 5.  If need estrogen supplement in future, need to use a patch form (Climara or Vivelle Dot) 6.  Start walking  - Industrial/product designer App for free - goal 10,000  steps per day.  Or, get a bicycle and ride 3-5 days per week. 7.  Recheck cholesterol and liver in 8 weeks (12/26/13 - fasting labs - lab opens at 7:30 am), and see Audrie Lia on 12/28/13 at 8:30 am

## 2013-11-08 ENCOUNTER — Encounter: Payer: Self-pay | Admitting: *Deleted

## 2013-11-08 NOTE — Telephone Encounter (Signed)
This encounter was created in error - please disregard.

## 2013-11-09 ENCOUNTER — Institutional Professional Consult (permissible substitution): Payer: BC Managed Care – PPO | Admitting: Pharmacist

## 2013-11-30 ENCOUNTER — Encounter: Payer: Self-pay | Admitting: Family Medicine

## 2013-11-30 ENCOUNTER — Ambulatory Visit: Payer: BC Managed Care – PPO | Admitting: Family Medicine

## 2013-11-30 ENCOUNTER — Ambulatory Visit (INDEPENDENT_AMBULATORY_CARE_PROVIDER_SITE_OTHER): Payer: BC Managed Care – PPO | Admitting: Family Medicine

## 2013-11-30 ENCOUNTER — Telehealth: Payer: Self-pay | Admitting: *Deleted

## 2013-11-30 VITALS — BP 122/66 | HR 60 | Temp 98.2°F | Resp 16 | Ht 65.0 in | Wt 161.4 lb

## 2013-11-30 DIAGNOSIS — F411 Generalized anxiety disorder: Secondary | ICD-10-CM

## 2013-11-30 DIAGNOSIS — Z23 Encounter for immunization: Secondary | ICD-10-CM

## 2013-11-30 DIAGNOSIS — L821 Other seborrheic keratosis: Secondary | ICD-10-CM

## 2013-11-30 DIAGNOSIS — Z1211 Encounter for screening for malignant neoplasm of colon: Secondary | ICD-10-CM

## 2013-11-30 DIAGNOSIS — Z1239 Encounter for other screening for malignant neoplasm of breast: Secondary | ICD-10-CM

## 2013-11-30 DIAGNOSIS — Z0000 Encounter for general adult medical examination without abnormal findings: Secondary | ICD-10-CM

## 2013-11-30 DIAGNOSIS — Z124 Encounter for screening for malignant neoplasm of cervix: Secondary | ICD-10-CM

## 2013-11-30 DIAGNOSIS — E781 Pure hyperglyceridemia: Secondary | ICD-10-CM

## 2013-11-30 MED ORDER — ALPRAZOLAM 0.5 MG PO TABS: 0.5000 mg | ORAL_TABLET | Freq: Every evening | ORAL | Status: DC | PRN

## 2013-11-30 NOTE — Patient Instructions (Signed)
Thank you for coming in for your physical.  We have set you up for a mammogram and a colonoscopy. You will be being contacted separately for each of these tests.  We did your pap smear today, you will be contacted with any abnormal results. Keep up the with healthy changes to your diet! Try to increase your exercise to help with your cholesterol! Please return to clinic with any new concerns.

## 2013-11-30 NOTE — Progress Notes (Signed)
Subjective:    Patient ID: Mary Wade, female    DOB: Jun 04, 1960, 53 y.o.   MRN: [id number]  Rockne CoonsLE, THAO PHUONG, DO  Chief Complaint  Patient presents with  . Annual Exam    no pap   Patient Active Problem List   Diagnosis Date Noted  . Palpitations 09/28/2013  . Bicuspid aortic valve 09/28/2013  . Hyperlipidemia 09/28/2013  . Tobacco abuse 09/28/2013  . Essential hypertension 09/28/2013    Prior to Admission medications   Medication Sig Start Date End Date Taking? Authorizing Provider  ALPRAZolam Prudy Feeler(XANAX) 0.5 MG tablet Take 1 tablet (0.5 mg total) by mouth at bedtime as needed for anxiety or sleep. 07/03/13  Yes Carmelina DaneJeffery S Anderson, MD  bisoprolol-hydrochlorothiazide (ZIAC) 10-6.25 MG per tablet TAKE 1 TABLET BY MOUTH DAILY. 10/03/13  Yes Carmelina DaneJeffery S Anderson, MD  fenofibrate 160 MG tablet Take 1 tablet (160 mg total) by mouth daily. 11/02/13  Yes Lewayne BuntingBrian S Crenshaw, MD  HYDROcodone-acetaminophen (NORCO/VICODIN) 5-325 MG per tablet Take 0.5 tablets by mouth every 6 (six) hours as needed for moderate pain.   Yes Historical Provider, MD  Omega-3 Fatty Acids (FISH OIL) 1000 MG CAPS Take 2 capsules (2,000 mg total) by mouth 2 (two) times daily. 11/02/13  Yes Lewayne BuntingBrian S Crenshaw, MD  omeprazole (PRILOSEC) 20 MG capsule Take 20 mg by mouth daily.   Yes Historical Provider, MD  promethazine (PHENERGAN) 12.5 MG tablet Take 1 tablet (12.5 mg total) by mouth every 8 (eight) hours as needed for nausea or vomiting. 07/03/13  Yes Sondra Bargesyan M Dunn, PA-C   Medications, allergies, past medical history, surgical history, family history, social history and problem list reviewed and updated.  HPI  Presents today for CPE. She has been doing well since last CPE one year ago. She recently went to the Lipid Clinic due to trigylcerides being over 800. She was started on fenofibrate 160mg  qd and had her fish oil increased to 4 g daily. She was encouraged to make some significant lifestyle changes from the Lipid Clinic.She  will be seeing them again next month as they are following her lipids and liver panels.   Diet - Since seeing the lipid clinic she is now only eating chicken, salmon, and tuna. She is not eating any egg yolks and eating egg whites 1x/week. She did cut back on her sweets since seeing them but has been eating more candy this past week. She is still going out to eat with friends every day of the week. She sas cut back on alcohol from 2-3 day to 1-2 times per week. She is still drinking 2-3 sweet tea per day. She knows she still has more changes to make.  Exercise - She purchased the bike from [employer]. She has only been on the bike 3 times since she bought it last month. She is not doing any walking.   She has no new issues or complaints since her last CPE visit here.   She is only taking her Xanax about once week for sleep. Norco and Phenergan very rarely (not since last pancreatitis attack several months ago).  She is having occasional hot flashes but they are not too bothersome and she is no longer considering HRT.  She saw cardiology several months ago. Had echo done which showed known bicuspid AV, mild pulm htn, and moderate AR. She has not been having any sx of CP, SOB, presyncope, or syncope. She also had a 30 day event monitor which she only wore for  1 week. Per pt they only found PVCs. She will seen them again next month.    She has not gotten a colonoscopy and is not planning to. Got a mammo at 53 yo but has not had one since. Willing to get another mammo. She has had a hysterectomy with cervix removed due to abnormal cervical changes. She had 5 years of normal paps after her hyster but has not had a pap in over 10 years at this point.   She wants to spot on her back looked at.   Review of Systems  No CP, SOB, N/V, diarrhea, syncope, presyncope, dysuria.     Objective:   Physical Exam  Constitutional: She is oriented to person, place, and time. She appears well-developed and  well-nourished.  BP 122/66  Pulse 60  Temp(Src) 98.2 F (36.8 C) (Oral)  Resp 16  Ht 5\' 5"  (1.651 m)  Wt 161 lb 6.4 oz (73.211 kg)  BMI 26.86 kg/m2  SpO2 98%   HENT:  Head: Normocephalic and atraumatic.  Mouth/Throat: Uvula is midline and mucous membranes are normal. No oropharyngeal exudate, posterior oropharyngeal edema or posterior oropharyngeal erythema.  Eyes: Conjunctivae and EOM are normal. Pupils are equal, round, and reactive to light.  Cardiovascular: Normal rate, regular rhythm and normal heart sounds.  Exam reveals no gallop.   No murmur heard. Pulmonary/Chest: Effort normal and breath sounds normal. She has no decreased breath sounds. She has no wheezes. She has no rhonchi. She has no rales.  Neurological: She is alert and oriented to person, place, and time. She has normal strength. No cranial nerve deficit or sensory deficit.  Skin: Skin is warm and dry.  0.5 cm x 0.5 cm dark brown oval-shaped lesion mid-back 0.5" left of mid-line. Elevated, rough surface. No bleeding. Borders regular.      Psychiatric: She has a normal mood and affect. Her speech is normal and behavior is normal.       Assessment & Plan:   [age] yof here for annual CPE.  Annual physical exam - Plan: Flu Vaccine QUAD 36+ mos IM, Tdap vaccine greater than or equal to 7yo IM --Screenings and immunizations done today. --Next CPE in one year.  Anxiety state - Plan: ALPRAZolam (XANAX) 0.5 MG tablet --Stable  Hypertriglyceridemia --Being followed by Lipid Clinic --Continue diet and exercise changes.   Screening for breast cancer - Plan: MM Digital Screening  Special screening for malignant neoplasms, colon - Plan: Ambulatory referral to Gastroenterology  Screening for cervical cancer - Plan: Pap IG w/ reflex to HPV when ASC-U  Seborrheic keratosis - --Pt encouraged to monitor for any changes   Donnajean Lopesodd M. Lijah Bourque, PA-C Physician Assistant-Certified [facility]  11/30/2013 10:21 AM

## 2013-11-30 NOTE — Telephone Encounter (Signed)
Monitor reviewed by dr crenshaw shows sinus with pvc's.  pt aware of results  

## 2013-11-30 NOTE — Progress Notes (Signed)
   Subjective:    Patient ID: Mary Wade, female    DOB: 20-Mar-1960, 53 y.o.   MRN: [id number]  HPI    Review of Systems  Constitutional: Negative.   HENT: Negative.   Eyes: Negative.   Respiratory: Negative.   Cardiovascular: Positive for leg swelling.  Gastrointestinal: Negative.   Endocrine: Negative.   Genitourinary: Negative.   Musculoskeletal: Negative.   Skin: Negative.   Allergic/Immunologic: Positive for environmental allergies.  Neurological: Negative.   Hematological: Negative.   Psychiatric/Behavioral: Negative.        Objective:   Physical Exam        Assessment & Plan:

## 2013-12-03 ENCOUNTER — Encounter: Payer: Self-pay | Admitting: Physician Assistant

## 2013-12-03 LAB — PAP IG W/ RFLX HPV ASCU

## 2013-12-26 ENCOUNTER — Other Ambulatory Visit (INDEPENDENT_AMBULATORY_CARE_PROVIDER_SITE_OTHER): Payer: BC Managed Care – PPO | Admitting: *Deleted

## 2013-12-26 DIAGNOSIS — E785 Hyperlipidemia, unspecified: Secondary | ICD-10-CM

## 2013-12-26 DIAGNOSIS — Z79899 Other long term (current) drug therapy: Secondary | ICD-10-CM

## 2013-12-26 LAB — HEPATIC FUNCTION PANEL
ALT: 121 U/L — AB (ref 0–35)
AST: 109 U/L — AB (ref 0–37)
Albumin: 3.6 g/dL (ref 3.5–5.2)
Alkaline Phosphatase: 54 U/L (ref 39–117)
BILIRUBIN TOTAL: 0.7 mg/dL (ref 0.2–1.2)
Bilirubin, Direct: 0.1 mg/dL (ref 0.0–0.3)
Total Protein: 6.9 g/dL (ref 6.0–8.3)

## 2013-12-26 LAB — LIPID PANEL
CHOLESTEROL: 241 mg/dL — AB (ref 0–200)
HDL: 33.3 mg/dL — AB (ref >39.00)
NonHDL: 207.7
TRIGLYCERIDES: 230 mg/dL — AB (ref 0.0–149.0)
Total CHOL/HDL Ratio: 7
VLDL: 46 mg/dL — ABNORMAL HIGH (ref 0.0–40.0)

## 2013-12-26 LAB — LDL CHOLESTEROL, DIRECT: Direct LDL: 152.2 mg/dL

## 2013-12-27 ENCOUNTER — Telehealth: Payer: Self-pay | Admitting: Cardiology

## 2013-12-27 NOTE — Telephone Encounter (Signed)
New message     Pt went on a 5hr round trip car drive on tues.  This afternoon pain started  In her rt groin area.  Could this be a blood clot forming?  Pt to baby aspirin.  Please advise

## 2013-12-27 NOTE — Telephone Encounter (Signed)
Spoke with patient. She has some pain in her femoral artery area of her right groin. She went on a long (5 hour round trip) car ride. He took a few aspirin and is no longer concerned over her issue right now. She is to see Kennon RoundsSally PharmD for lipid clinic tomorrow and OV with Dr. Jens Somrenshaw on 11/20. She states if she is still concerned she will talk to Kennon RoundsSally about it tomorrow. Informed her I would send this to Dr. Jens Somrenshaw & [facility] as Lorain ChildesFYI

## 2013-12-28 ENCOUNTER — Ambulatory Visit (INDEPENDENT_AMBULATORY_CARE_PROVIDER_SITE_OTHER): Payer: BC Managed Care – PPO | Admitting: Pharmacist

## 2013-12-28 DIAGNOSIS — E785 Hyperlipidemia, unspecified: Secondary | ICD-10-CM

## 2013-12-28 NOTE — Assessment & Plan Note (Signed)
Pt's TG much improved with fenofibrate but unfortunately her AST and ALT are elevated because of the medication.  Will have to stop at this time.  LDL increase likely due to improvement in TG- the LDL of 115 in September may have been falsely low due to elevated TG.  Pt states she had flushing with Niacin in the past but she was taking OTC and did not take an aspirin prior to the dose.  She states she would be willing to try that again in the future using the Rx version with ASA.  Will wait until liver enzymes normalize before trying alternate therapy.  Plan to recheck labs in 4 weeks.  If LFTs are ok, will try niaspan at that time.  Will slowly titrate dose to limit risk of elevated LFTs with Niaspan.

## 2013-12-28 NOTE — Patient Instructions (Signed)
Stop fenofibrate.  Continue all of your diet changes you have already made.   Try to limit your cheese and alcohol as much as possible.   Recheck labs in 6 weeks.

## 2013-12-28 NOTE — Progress Notes (Signed)
Patient is a 53 y.o. WM referred to lipid clinic by Dr. Jens Somrenshaw given h/o hypertriglyceridemia.  Her TG were 804 mg/dL earlier this year on just Red yeast rice and fish oil 1,000 mg once daily.  She has failed pravastatin (muscle aches) and niacin (flushing) in the past.  SShe did have an episode of pancreatitis in the past ~ 05/2011.  She has a family history of premature CAD with her father having an MI at 52 y.o.  Neither parent had diabetes or pancreatitis in the past.  Her glucose and TSH are normal.  At the last visit, she was started on fenofibrate 160mg  daily and encouraged to make several diet and exercise changes. She has been compliant with her medication.  She did not start any exercise routine but did try to make some dietary changes.  She is no longer eating egg yolks- only egg whites.  She has decreased beef and cheeseburgers and started eating more chicken.  She does still go out to eat most of the time but trying to make better choices when she goes out.  She has cut down on soft drinks and sweet tea but continues 4-6 beers per week.    RF:  Family h/o premature CAD (father), tobacco use, HTN, metabolic syndrome - LDL goal < 100, non-HDL goal < 130 Meds:  Fenofibrate 160mg  daily and fish oil 4 gm daily Intolerant:  Niacin (flushing), pravastatin 10 mg qd (muscle aches)  Social history:  Smokes 1/2 ppd (quit in the past, but restarted due to stress); drinks 4-6 beers per week now  Labs: 12/2013:  TC 241, TG 230, HDL 33, LDL 152, non-HDL 208, AST 161109, ALT 121 0/96049/2015:  TC 307, TG 804, HDL 31, LDL 115, non-HDL 276 (goal < 130 mg/dL), TSH normal (red yeast rice, fish oil 1 g/d) 06/2013:  Glucose normal, LFTs normal  Current Outpatient Prescriptions  Medication Sig Dispense Refill  . ALPRAZolam (XANAX) 0.5 MG tablet Take 1 tablet (0.5 mg total) by mouth at bedtime as needed for anxiety or sleep. 30 tablet 1  . bisoprolol-hydrochlorothiazide (ZIAC) 10-6.25 MG per tablet TAKE 1 TABLET BY  MOUTH DAILY. 30 tablet 2  . HYDROcodone-acetaminophen (NORCO/VICODIN) 5-325 MG per tablet Take 0.5 tablets by mouth every 6 (six) hours as needed for moderate pain.    . Omega-3 Fatty Acids (FISH OIL) 1000 MG CAPS Take 2 capsules (2,000 mg total) by mouth 2 (two) times daily.    Marland Kitchen. omeprazole (PRILOSEC) 20 MG capsule Take 20 mg by mouth daily.    . promethazine (PHENERGAN) 12.5 MG tablet Take 1 tablet (12.5 mg total) by mouth every 8 (eight) hours as needed for nausea or vomiting. 20 tablet 0   Current Facility-Administered Medications  Medication Dose Route Frequency Provider Last Rate Last Dose  . promethazine (PHENERGAN) injection 25 mg  25 mg Intramuscular Q6H PRN Lamar LaundryLaura C Mayans, MD   25 mg at 06/03/11 1856   Allergies  Allergen Reactions  . Niacin And Related     flush  . Pravastatin     Muscle aches   Family History  Problem Relation Age of Onset  . Heart disease Mother   . Heart disease Father     Died of MI at age 52

## 2013-12-31 NOTE — Progress Notes (Signed)
      HPI: FU history of bicuspid aortic valve and palpitations. Nuclear study 2003 normal with ejection 60%. ABIs June 2015 normal. Monitor in September 2015 showed sinus rhythm with PVCs. Echocardiogram September 2015 showed normal LV function, bicuspid aortic valve, moderate aortic insufficiency, mildly dilated aortic root and trace mitral and tricuspid regurgitation. Since last seen There is no dyspnea, chest pain or syncope. She continues to have palpitations but improved.  Current Outpatient Prescriptions  Medication Sig Dispense Refill  . ALPRAZolam (XANAX) 0.5 MG tablet Take 1 tablet (0.5 mg total) by mouth at bedtime as needed for anxiety or sleep. 30 tablet 1  . bisoprolol-hydrochlorothiazide (ZIAC) 10-6.25 MG per tablet TAKE 1 TABLET BY MOUTH DAILY 30 tablet 2  . HYDROcodone-acetaminophen (NORCO/VICODIN) 5-325 MG per tablet Take 0.5 tablets by mouth every 6 (six) hours as needed for moderate pain.    . Omega-3 Fatty Acids (FISH OIL) 1000 MG CAPS Take 2 capsules (2,000 mg total) by mouth 2 (two) times daily.    Marland Kitchen. omeprazole (PRILOSEC) 20 MG capsule Take 20 mg by mouth daily.    . promethazine (PHENERGAN) 12.5 MG tablet Take 1 tablet (12.5 mg total) by mouth every 8 (eight) hours as needed for nausea or vomiting. 20 tablet 0   Current Facility-Administered Medications  Medication Dose Route Frequency Provider Last Rate Last Dose  . promethazine (PHENERGAN) injection 25 mg  25 mg Intramuscular Q6H PRN Lamar LaundryLaura C Mayans, MD   25 mg at 06/03/11 1856     Past Medical History  Diagnosis Date  . Hypertension   . Bicuspid aortic valve   . Pancreatitis   . Hyperlipidemia     Past Surgical History  Procedure Laterality Date  . Appendectomy    . Abdominal hysterectomy    . Cholecystectomy      History   Social History  . Marital Status: Single    Spouse Name: N/A    Number of Children: 2  . Years of Education: N/A   Occupational History  .      Dental office   Social  History Main Topics  . Smoking status: Current Every Day Smoker -- 0.50 packs/day for 40 years    Types: Cigarettes  . Smokeless tobacco: Never Used  . Alcohol Use: 2.0 oz/week    4 drink(s) per week     Comment: sometimes  . Drug Use: No  . Sexual Activity: Yes   Other Topics Concern  . Not on file   Social History Narrative    ROS: no fevers or chills, productive cough, hemoptysis, dysphasia, odynophagia, melena, hematochezia, dysuria, hematuria, rash, seizure activity, orthopnea, PND, pedal edema, claudication. Remaining systems are negative.  Physical Exam: Well-developed well-nourished in no acute distress.  Skin is warm and dry.  HEENT is normal.  Neck is supple.  Chest is clear to auscultation with normal expansion.  Cardiovascular exam is regular rate and rhythm.  Abdominal exam nontender or distended. No masses palpated. Extremities show no edema. neuro grossly intact

## 2014-01-03 ENCOUNTER — Encounter: Payer: Self-pay | Admitting: Family Medicine

## 2014-01-04 ENCOUNTER — Encounter: Payer: Self-pay | Admitting: Cardiology

## 2014-01-04 ENCOUNTER — Other Ambulatory Visit: Payer: Self-pay | Admitting: Emergency Medicine

## 2014-01-04 ENCOUNTER — Ambulatory Visit (INDEPENDENT_AMBULATORY_CARE_PROVIDER_SITE_OTHER): Payer: BC Managed Care – PPO | Admitting: Cardiology

## 2014-01-04 ENCOUNTER — Other Ambulatory Visit: Payer: Self-pay | Admitting: *Deleted

## 2014-01-04 DIAGNOSIS — Q231 Congenital insufficiency of aortic valve: Secondary | ICD-10-CM

## 2014-01-04 NOTE — Assessment & Plan Note (Signed)
Patient is noted to have a bicuspid aortic valve with moderate aortic insufficiency. She will need follow-up echocardiograms in the future. Plan MRA of thoracic aorta to exclude aneurysm and MRA of cerebral circulation to exclude aneurysm.

## 2014-01-04 NOTE — Assessment & Plan Note (Signed)
Patient counseled on discontinuing. 

## 2014-01-04 NOTE — Assessment & Plan Note (Signed)
Continue beta blocker. 

## 2014-01-04 NOTE — Assessment & Plan Note (Signed)
Management per primary care. 

## 2014-01-04 NOTE — Assessment & Plan Note (Signed)
Blood pressure controlled. Continue present medications. 

## 2014-01-04 NOTE — Patient Instructions (Signed)
Your physician wants you to follow-up in: ONE YEAR WITH DR Huston FoleyRENSHAEW You will receive a reminder letter in the mail two months in advance. If you don't receive a letter, please call our office to schedule the follow-up appointment.  MRA OF THE CHEST W/WO FOR DILATED AORTIC ROOT  MRA OF THE HEAD WO TO R/O CEREBRAL ANEURYSM

## 2014-01-17 ENCOUNTER — Ambulatory Visit
Admission: RE | Admit: 2014-01-17 | Discharge: 2014-01-17 | Disposition: A | Discharge status: Home / Self Care | Payer: BC Managed Care – PPO | Source: Ambulatory Visit | Attending: Cardiology | Admitting: Cardiology

## 2014-01-17 ENCOUNTER — Other Ambulatory Visit: Payer: Self-pay | Admitting: Emergency Medicine

## 2014-01-17 DIAGNOSIS — Q231 Congenital insufficiency of aortic valve: Secondary | ICD-10-CM

## 2014-01-17 MED ORDER — GADOBENATE DIMEGLUMINE 529 MG/ML IV SOLN
15.0000 mL | Freq: Once | INTRAVENOUS | Status: AC | PRN
  Administered 2014-01-17: 15 mL via INTRAVENOUS

## 2014-01-18 ENCOUNTER — Encounter: Payer: Self-pay | Admitting: Cardiology

## 2014-01-18 NOTE — Telephone Encounter (Signed)
This encounter was created in error - please disregard.

## 2014-01-18 NOTE — Telephone Encounter (Signed)
New message      Want MRA results

## 2014-01-20 ENCOUNTER — Other Ambulatory Visit: Payer: Self-pay | Admitting: Emergency Medicine

## 2014-01-28 ENCOUNTER — Encounter: Payer: Self-pay | Admitting: Family Medicine

## 2014-01-28 ENCOUNTER — Ambulatory Visit (INDEPENDENT_AMBULATORY_CARE_PROVIDER_SITE_OTHER): Payer: BC Managed Care – PPO

## 2014-01-28 ENCOUNTER — Ambulatory Visit (INDEPENDENT_AMBULATORY_CARE_PROVIDER_SITE_OTHER): Payer: BC Managed Care – PPO | Admitting: Family Medicine

## 2014-01-28 VITALS — BP 130/80 | HR 87 | Temp 98.7°F | Resp 18

## 2014-01-28 DIAGNOSIS — N760 Acute vaginitis: Secondary | ICD-10-CM

## 2014-01-28 DIAGNOSIS — R05 Cough: Secondary | ICD-10-CM

## 2014-01-28 DIAGNOSIS — A499 Bacterial infection, unspecified: Secondary | ICD-10-CM

## 2014-01-28 DIAGNOSIS — R059 Cough, unspecified: Secondary | ICD-10-CM

## 2014-01-28 DIAGNOSIS — B9689 Other specified bacterial agents as the cause of diseases classified elsewhere: Secondary | ICD-10-CM

## 2014-01-28 DIAGNOSIS — J029 Acute pharyngitis, unspecified: Secondary | ICD-10-CM

## 2014-01-28 DIAGNOSIS — J069 Acute upper respiratory infection, unspecified: Secondary | ICD-10-CM

## 2014-01-28 DIAGNOSIS — Z1239 Encounter for other screening for malignant neoplasm of breast: Secondary | ICD-10-CM

## 2014-01-28 LAB — POCT INFLUENZA A/B
Influenza A, POC: NEGATIVE
Influenza B, POC: NEGATIVE

## 2014-01-28 MED ORDER — HYDROCOD POLST-CHLORPHEN POLST 10-8 MG/5ML PO LQCR: 5.0000 mL | Freq: Every evening | ORAL | Status: DC | PRN

## 2014-01-28 MED ORDER — AZITHROMYCIN 250 MG PO TABS: ORAL_TABLET | ORAL | Status: DC

## 2014-01-28 MED ORDER — MUCINEX DM MAXIMUM STRENGTH 60-1200 MG PO TB12: 1.0000 | ORAL_TABLET | Freq: Two times a day (BID) | ORAL | Status: DC

## 2014-01-28 NOTE — Progress Notes (Addendum)
Subjective:  This chart was scribed for Mary SimmerKristi Hasaan Radde, MD by Mary Wade, ED Scribe. This Patient was seen in room 12 and the patients care was started at 7:22 PM   Patient ID: Mary Wade, female    DOB: Aug 23, 1960, 53 y.o.   MRN: [id number]  01/28/2014  Fever; Cough; Sore Throat; and Chest Pain   HPI HPI Comments: Mary SermonSusan M Isaacson is a 53 y.o. female who presents to the [facility] complaining of chest tightness onset 1 day ago.  Pt states that last night she choked on some popcorn.  It took 45 minutes for choking episode to completely resolve.   She then awoke this morning acutely ill.  She states that she has a sore throat, cough, rhinorrhea. She states he has generalized myalgias, chills and ear pain. Pt states that it hurts to swallow. Pt states she does intermittently feel SOB that is worsened when walking. She states she has also had some diarrhea. Pt states that she has recent sick contacts. Pt states she is an everyday smoker. Denies vomiting.  Pt took two Zithromax tablets this morning without improvement.  S/p flu vaccine. Was worried that something else may be going on.  Also wanting to know status of referral to GI specialist for colonoscopy; also asking about mammogram.  Also wanted to review pap smear results.  Denies any vaginal discharge or irritation or itching.   Review of Systems  Constitutional: Positive for fever, chills, activity change and fatigue. Negative for diaphoresis.  HENT: Positive for ear pain, rhinorrhea, sore throat, trouble swallowing and voice change. Negative for congestion and sinus pressure.   Respiratory: Positive for cough, chest tightness and shortness of breath. Negative for wheezing.   Cardiovascular: Negative for chest pain, palpitations and leg swelling.  Gastrointestinal: Positive for diarrhea. Negative for nausea, vomiting and abdominal pain.  Musculoskeletal: Positive for myalgias.  Skin: Negative for rash.    Neurological: Positive for headaches. Negative for dizziness.    Past Medical History  Diagnosis Date  . Hypertension   . Bicuspid aortic valve   . Pancreatitis   . Hyperlipidemia    Past Surgical History  Procedure Laterality Date  . Appendectomy    . Abdominal hysterectomy    . Cholecystectomy     Allergies  Allergen Reactions  . Niacin And Related     flush  . Pravastatin     Muscle aches   Current Outpatient Prescriptions  Medication Sig Dispense Refill  . ALPRAZolam (XANAX) 0.5 MG tablet Take 1 tablet (0.5 mg total) by mouth at bedtime as needed for anxiety or sleep. 30 tablet 1  . azithromycin (ZITHROMAX) 250 MG tablet Two tablets daily x 1 day then one tablet daily x 4 days 6 tablet 0  . bisoprolol-hydrochlorothiazide (ZIAC) 10-6.25 MG per tablet TAKE 1 TABLET BY MOUTH DAILY 30 tablet 2  . chlorpheniramine-HYDROcodone (TUSSIONEX) 10-8 MG/5ML LQCR Take 5 mLs by mouth at bedtime as needed for cough. 120 mL 0  . Dextromethorphan-Guaifenesin (MUCINEX DM MAXIMUM STRENGTH) 60-1200 MG TB12 Take 1 tablet by mouth every 12 (twelve) hours. 20 each 0  . HYDROcodone-acetaminophen (NORCO/VICODIN) 5-325 MG per tablet Take 0.5 tablets by mouth every 6 (six) hours as needed for moderate pain.    . Omega-3 Fatty Acids (FISH OIL) 1000 MG CAPS Take 2 capsules (2,000 mg total) by mouth 2 (two) times daily.    Marland Kitchen. omeprazole (PRILOSEC) 20 MG capsule Take 20 mg by mouth daily.    .Marland Kitchen  promethazine (PHENERGAN) 12.5 MG tablet Take 1 tablet (12.5 mg total) by mouth every 8 (eight) hours as needed for nausea or vomiting. 20 tablet 0   Current Facility-Administered Medications  Medication Dose Route Frequency Provider Last Rate Last Dose  . promethazine (PHENERGAN) injection 25 mg  25 mg Intramuscular Q6H PRN Lamar LaundryLaura C Mayans, MD   25 mg at 06/03/11 1856       Objective:    BP 130/80 mmHg  Pulse 87  Temp(Src) 98.7 F (37.1 C) (Oral)  Resp 18  SpO2 97%   Physical Exam  Constitutional: She is  oriented to person, place, and time. She appears well-developed and well-nourished. No distress.  HENT:  Head: Normocephalic and atraumatic.  Right Ear: External ear normal.  Left Ear: External ear normal.  Nose: Mucosal edema and rhinorrhea present. Right sinus exhibits no maxillary sinus tenderness and no frontal sinus tenderness. Left sinus exhibits no maxillary sinus tenderness and no frontal sinus tenderness.  Mouth/Throat: Oropharynx is clear and moist.  Eyes: Conjunctivae and EOM are normal.  Neck: Neck supple. No tracheal deviation present.  Cardiovascular: Normal rate, regular rhythm and normal heart sounds.  Exam reveals no friction rub.   No murmur heard. Pulmonary/Chest: Effort normal. No stridor. No respiratory distress. She has no wheezes. She has no rales.  Abdominal: Soft. Bowel sounds are normal. She exhibits no distension. There is no tenderness. There is no rebound.  Musculoskeletal: Normal range of motion.  Lymphadenopathy:    She has cervical adenopathy.  Neurological: She is alert and oriented to person, place, and time.  Skin: Skin is warm and dry. No rash noted. She is not diaphoretic.  Psychiatric: She has a normal mood and affect. Her behavior is normal.  Nursing note and vitals reviewed.  Results for orders placed or performed in visit on 01/28/14  POCT Influenza A/B  Result Value Ref Range   Influenza A, POC Negative    Influenza B, POC Negative    [facility] reading (PRIMARY) by  Dr. Katrinka BlazingSmith.  CXR:  NAD      Assessment & Plan:   1. Cough   2. Sore throat   3. Breast cancer screening   4. Acute upper respiratory infection   5. BV (bacterial vaginosis)     1. URI: New.  Rx for Mucinex DM and Tussionex provided. Pt declined rx for nasal spray.  If no improvement in one week, start Zpack.  RTC for acute worsening.  Continue Ibuprofen and/or Tylenol for fever. 2.  Breast cancer screening:  Refer for mammogram. 3. BV: New on pap smear; pt asymptomatic and  declined treatment.    Meds ordered this encounter  Medications  . chlorpheniramine-HYDROcodone (TUSSIONEX) 10-8 MG/5ML LQCR    Sig: Take 5 mLs by mouth at bedtime as needed for cough.    Dispense:  120 mL    Refill:  0  . Dextromethorphan-Guaifenesin (MUCINEX DM MAXIMUM STRENGTH) 60-1200 MG TB12    Sig: Take 1 tablet by mouth every 12 (twelve) hours.    Dispense:  20 each    Refill:  0  . azithromycin (ZITHROMAX) 250 MG tablet    Sig: Two tablets daily x 1 day then one tablet daily x 4 days    Dispense:  6 tablet    Refill:  0    No Follow-up on file.    I personally performed the services described in this documentation, which was scribed in my presence. The recorded information has been reviewed and considered.  Reginia Forts, M.D.  [facility] [address] [place], [place]  [phone] phone [phone] fax

## 2014-01-28 NOTE — Patient Instructions (Signed)
Upper Respiratory Infection, Adult An upper respiratory infection (URI) is also sometimes known as the common cold. The upper respiratory tract includes the nose, sinuses, throat, trachea, and bronchi. Bronchi are the airways leading to the lungs. Most people improve within 1 week, but symptoms can last up to 2 weeks. A residual cough may last even longer.  CAUSES Many different viruses can infect the tissues lining the upper respiratory tract. The tissues become irritated and inflamed and often become very moist. Mucus production is also common. A cold is contagious. You can easily spread the virus to others by oral contact. This includes kissing, sharing a glass, coughing, or sneezing. Touching your mouth or nose and then touching a surface, which is then touched by another person, can also spread the virus. SYMPTOMS  Symptoms typically develop 1 to 3 days after you come in contact with a cold virus. Symptoms vary from person to person. They may include:  Runny nose.  Sneezing.  Nasal congestion.  Sinus irritation.  Sore throat.  Loss of voice (laryngitis).  Cough.  Fatigue.  Muscle aches.  Loss of appetite.  Headache.  Low-grade fever. DIAGNOSIS  You might diagnose your own cold based on familiar symptoms, since most people get a cold 2 to 3 times a year. Your caregiver can confirm this based on your exam. Most importantly, your caregiver can check that your symptoms are not due to another disease such as strep throat, sinusitis, pneumonia, asthma, or epiglottitis. Blood tests, throat tests, and X-rays are not necessary to diagnose a common cold, but they may sometimes be helpful in excluding other more serious diseases. Your caregiver will decide if any further tests are required. RISKS AND COMPLICATIONS  You may be at risk for a more severe case of the common cold if you smoke cigarettes, have chronic heart disease (such as heart failure) or lung disease (such as asthma), or if  you have a weakened immune system. The very young and very old are also at risk for more serious infections. Bacterial sinusitis, middle ear infections, and bacterial pneumonia can complicate the common cold. The common cold can worsen asthma and chronic obstructive pulmonary disease (COPD). Sometimes, these complications can require emergency medical care and may be life-threatening. PREVENTION  The best way to protect against getting a cold is to practice good hygiene. Avoid oral or hand contact with people with cold symptoms. Wash your hands often if contact occurs. There is no clear evidence that vitamin C, vitamin E, echinacea, or exercise reduces the chance of developing a cold. However, it is always recommended to get plenty of rest and practice good nutrition. TREATMENT  Treatment is directed at relieving symptoms. There is no cure. Antibiotics are not effective, because the infection is caused by a virus, not by bacteria. Treatment may include:  Increased fluid intake. Sports drinks offer valuable electrolytes, sugars, and fluids.  Breathing heated mist or steam (vaporizer or shower).  Eating chicken soup or other clear broths, and maintaining good nutrition.  Getting plenty of rest.  Using gargles or lozenges for comfort.  Controlling fevers with ibuprofen or acetaminophen as directed by your caregiver.  Increasing usage of your inhaler if you have asthma. Zinc gel and zinc lozenges, taken in the first 24 hours of the common cold, can shorten the duration and lessen the severity of symptoms. Pain medicines may help with fever, muscle aches, and throat pain. A variety of non-prescription medicines are available to treat congestion and runny nose. Your caregiver   can make recommendations and may suggest nasal or lung inhalers for other symptoms.  HOME CARE INSTRUCTIONS   Only take over-the-counter or prescription medicines for pain, discomfort, or fever as directed by your  caregiver.  Use a warm mist humidifier or inhale steam from a shower to increase air moisture. This may keep secretions moist and make it easier to breathe.  Drink enough water and fluids to keep your urine clear or pale yellow.  Rest as needed.  Return to work when your temperature has returned to normal or as your caregiver advises. You may need to stay home longer to avoid infecting others. You can also use a face mask and careful hand washing to prevent spread of the virus. SEEK MEDICAL CARE IF:   After the first few days, you feel you are getting worse rather than better.  You need your caregiver's advice about medicines to control symptoms.  You develop chills, worsening shortness of breath, or brown or red sputum. These may be signs of pneumonia.  You develop yellow or brown nasal discharge or pain in the face, especially when you bend forward. These may be signs of sinusitis.  You develop a fever, swollen neck glands, pain with swallowing, or white areas in the back of your throat. These may be signs of strep throat. SEEK IMMEDIATE MEDICAL CARE IF:   You have a fever.  You develop severe or persistent headache, ear pain, sinus pain, or chest pain.  You develop wheezing, a prolonged cough, cough up blood, or have a change in your usual mucus (if you have chronic lung disease).  You develop sore muscles or a stiff neck. Document Released: 07/28/2000 Document Revised: 04/26/2011 Document Reviewed: 05/09/2013 [employer] Patient Information 2015 [employer]. This information is not intended to replace advice given to you by your health care provider. Make sure you discuss any questions you have with your health care provider.  

## 2014-01-31 ENCOUNTER — Other Ambulatory Visit (INDEPENDENT_AMBULATORY_CARE_PROVIDER_SITE_OTHER): Payer: BC Managed Care – PPO | Admitting: *Deleted

## 2014-01-31 DIAGNOSIS — E785 Hyperlipidemia, unspecified: Secondary | ICD-10-CM

## 2014-01-31 LAB — LIPID PANEL
Cholesterol: 228 mg/dL — ABNORMAL HIGH (ref 0–200)
HDL: 33.2 mg/dL — ABNORMAL LOW (ref >39.00)
NonHDL: 194.8
TRIGLYCERIDES: 228 mg/dL — AB (ref 0.0–149.0)
Total CHOL/HDL Ratio: 7
VLDL: 45.6 mg/dL — ABNORMAL HIGH (ref 0.0–40.0)

## 2014-01-31 LAB — HEPATIC FUNCTION PANEL
ALBUMIN: 4.1 g/dL (ref 3.5–5.2)
ALT: 31 U/L (ref 0–35)
AST: 23 U/L (ref 0–37)
Alkaline Phosphatase: 58 U/L (ref 39–117)
BILIRUBIN TOTAL: 0.5 mg/dL (ref 0.2–1.2)
Bilirubin, Direct: 0 mg/dL (ref 0.0–0.3)
TOTAL PROTEIN: 7.2 g/dL (ref 6.0–8.3)

## 2014-01-31 LAB — LDL CHOLESTEROL, DIRECT: Direct LDL: 140.2 mg/dL

## 2014-02-01 ENCOUNTER — Other Ambulatory Visit: Payer: BC Managed Care – PPO

## 2014-02-04 ENCOUNTER — Encounter: Payer: Self-pay | Admitting: *Deleted

## 2014-02-23 ENCOUNTER — Other Ambulatory Visit: Payer: Self-pay | Admitting: Emergency Medicine

## 2014-02-26 ENCOUNTER — Other Ambulatory Visit: Payer: Self-pay | Admitting: Emergency Medicine

## 2014-02-26 NOTE — Telephone Encounter (Signed)
Spoke w/pt and she clarified she needs her Ziac Rx refilled. I advised that Dr Conley RollsLe is pt's PCP and is actually the last provider to see her for her BP and she agreed that it is fine for me to ask Dr Conley RollsLe for the RFs. Pt is out of medication except for 1 tab that pharm gave her, so I advised that I will send in 90 day supply per protocol and send to Dr Conley RollsLe to see if she will authorize more RFs for remainder of year. Pended more RFs. Dr Conley RollsLe, please advise.

## 2014-02-26 NOTE — Telephone Encounter (Signed)
Patient states she needs refills for a year from her OV with Dr. Katrinka BlazingSmith - she has / had an aneurysm.  States she wants to see Dr. Lonn GeorgiaSmiths name on the prescriptions.  [facility]   [phone]

## 2014-02-27 MED ORDER — BISOPROLOL-HYDROCHLOROTHIAZIDE 10-6.25 MG PO TABS: 1.0000 | ORAL_TABLET | Freq: Every day | ORAL | Status: DC

## 2014-02-27 NOTE — Telephone Encounter (Signed)
Notified pt Dr Conley RollsLe

## 2014-02-27 NOTE — Telephone Encounter (Signed)
Tried to call pt back to advise Dr Conley RollsLe OKd 2 add'l 90 day RFs which will take her through the whole year since last check-up, but VM was full. Couldn't LM

## 2014-02-28 NOTE — Telephone Encounter (Signed)
Notified pt more RFs sent and that her VM was full.

## 2014-05-17 ENCOUNTER — Ambulatory Visit (INDEPENDENT_AMBULATORY_CARE_PROVIDER_SITE_OTHER): Payer: BLUE CROSS/BLUE SHIELD | Admitting: Physician Assistant

## 2014-05-17 VITALS — BP 126/66 | HR 76 | Temp 98.1°F | Resp 20 | Ht 64.5 in | Wt 167.4 lb

## 2014-05-17 DIAGNOSIS — Z1329 Encounter for screening for other suspected endocrine disorder: Secondary | ICD-10-CM

## 2014-05-17 DIAGNOSIS — Z113 Encounter for screening for infections with a predominantly sexual mode of transmission: Secondary | ICD-10-CM

## 2014-05-17 DIAGNOSIS — R2233 Localized swelling, mass and lump, upper limb, bilateral: Secondary | ICD-10-CM | POA: Diagnosis not present

## 2014-05-17 DIAGNOSIS — Z111 Encounter for screening for respiratory tuberculosis: Secondary | ICD-10-CM | POA: Diagnosis not present

## 2014-05-17 LAB — POCT WET PREP WITH KOH
KOH Prep POC: NEGATIVE
RBC Wet Prep HPF POC: NEGATIVE
Trichomonas, UA: NEGATIVE
Yeast Wet Prep HPF POC: NEGATIVE

## 2014-05-17 LAB — RHEUMATOID FACTOR: Rhuematoid fact SerPl-aCnc: <10 IU/mL (ref <=14)

## 2014-05-17 LAB — TSH: TSH: 1.051 u[IU]/mL (ref 0.350–4.500)

## 2014-05-17 LAB — RPR

## 2014-05-17 LAB — POCT SEDIMENTATION RATE: POCT SED RATE: 21 mm/h (ref 0–22)

## 2014-05-17 NOTE — Patient Instructions (Signed)
Rheumatoid Factor This is a test used to help diagnose rheumatoid arthritis (RA), Sjogren syndrome, or other autoimmune diseases. This test detects evidence of rheumatoid factor (RF), which is a type of autoantibody. An antibody is a protective protein that forms in the blood, typically in response to a foreign material, usually another protein known as an antigen. Autoantibodies, however, are antibodies that are capable of targeting one's own proteins rather than those of an outside agent, such as bacterial protein. Rheumatoid factors are autoantibodies directed against a fragment of the class of immunoglobulins known as IgG and are members of a class of proteins that become elevated in states of inflammation. Rheumatoid factor is elevated in almost all patients with inflammation and is, therefore, a sensitive test for monitoring the level of inflammation associated with rheumatoid arthritis. It is not a diagnostic test, however, due to its low specificity. Experts still do not understand exactly how RF is formed or why, but it is believed that RF probably does not directly cause joint damage but that it helps to promote the body's inflammation reaction, which contributes to the tissue destruction seen in rheumatoid arthritis.  PREPARATION FOR TEST A blood sample is obtained by inserting a needle into a vein in the arm. NORMAL FINDINGS Negative (Less than 60 units/mL by nephelometric testing); elderly patients may have slightly increased values. Ranges for normal findings may vary among different laboratories and hospitals. You should always check with your doctor after having lab work or other tests done to discuss the meaning of your test results and whether your values are considered within normal limits. MEANING OF TEST  Your caregiver will go over the test results with you and discuss the importance and meaning of your results, as well as treatment options and the need for additional tests if  necessary. OBTAINING THE TEST RESULTS  It is your responsibility to obtain your test results. Ask the lab or department performing the test when and how you will get your results. Document Released: 03/06/2004 Document Revised: 06/18/2013 Document Reviewed: 01/14/2008 [facility] Patient Information 2015 [place], [place]. This information is not intended to replace advice given to you by your health care provider. Make sure you discuss any questions you have with your health care provider.

## 2014-05-17 NOTE — Progress Notes (Signed)
Subjective:    Patient ID: Mary Wade, female    DOB: [birth year], 54 y.o.   MRN: [id number]  HPI Patient presents to have foster parent forms completed, bumps of fingers, STD screen, and foot injury.   Has been a foster parent for many years and currently has 3 siblings (all teenagers). States that her foster children have been a joy to her. No known limitations of caring out being a responsible parent. PMH positive for HTN which is controlled with medication. Works as a Armed forces operational officer. Unsure if she needs TB test.   Was diagnosed with arthritis several years ago and pain in hands generally is 2-3/10 and does not radiate. Over past month had noticed bump on knuckle of thumb and index finger. Bumps are not painful and not erythematous. Denies fever, loss of ROM/sensation/function. Fingers are stiff in morning and towards end of day. Has swelling in fingers some nights. Thinks left foot has same bump as well. Ibuprofen helps.  Injured left foot while working out 1 month ago. Was performing push ups and planks using yoga ball and felt something pull. Denies edema, redness, bruising, gait change, numbness, weakness, or loss of ROM/function/sensation. Pain has been 5/10 at times, but usually towards the end of the day. Wrapping foot and ibuprofen relieve pain.   Boyfriend of 5 years cheated on her recently and had recent intercourse. They do not use condoms and had 1 bump resembling a pimple appear on her vagina yesterday. Bump is not painful, red, and has not drained. Has not happened before. No previous STD history. Last pap 2015 was normal. Not other vaginal sx, urinary sx, or abdominal/pelvic pian.  Nails are breaking more often and hair is more dry has noticed over the past couple of weeks. States that she drinks plenty of water, but diet is poor. Does not get much fruits and vegetables. Has been evaluated for thyroid dz in the past due to mood changes, but was negative. Endorses polyphagia,  but denies fatigue, skin changes, temperature intolerance, HA, N/V, changes in bowels, or current changes in behavior. Sweats at random times, but is currently going through menopause. Has gained weight.  Review of Systems  Constitutional: Positive for diaphoresis. Negative for fever, chills, activity change, appetite change, fatigue and unexpected weight change.  Eyes: Negative for photophobia and visual disturbance.  Respiratory: Negative for cough, chest tightness and shortness of breath.   Cardiovascular: Negative for chest pain, palpitations and leg swelling.  Gastrointestinal: Negative for nausea, vomiting, diarrhea and constipation.  Genitourinary: Positive for menstrual problem (perimenopause). Negative for dysuria, urgency, frequency, hematuria, flank pain, decreased urine volume, difficulty urinating, genital sores and pelvic pain.  Musculoskeletal: Positive for myalgias and arthralgias. Negative for back pain and gait problem.  Skin: Negative for rash.  Neurological: Negative for dizziness and headaches.  Psychiatric/Behavioral: Negative for behavioral problems and dysphoric mood.       Objective:   Physical Exam  Constitutional: She is oriented to person, place, and time. She appears well-developed and well-nourished. No distress.  Blood pressure 126/66, pulse 76, temperature 98.1 F (36.7 C), temperature source Oral, resp. rate 20, height 5' 4.5" (1.638 m), weight 167 lb 6 oz (75.921 kg), SpO2 99 %.  HENT:  Head: Normocephalic and atraumatic.  Right Ear: External ear normal.  Left Ear: External ear normal.  Mouth/Throat: Oropharynx is clear and moist. No oropharyngeal exudate.  Eyes: Conjunctivae are normal. Pupils are equal, round, and reactive to light. Right eye exhibits no discharge.  Left eye exhibits no discharge. No scleral icterus.  Neck: Normal range of motion. Neck supple. No thyromegaly present.  Cardiovascular: Normal rate and regular rhythm.  Exam reveals no  gallop and no friction rub.   Murmur heard. Pulmonary/Chest: Effort normal and breath sounds normal. No respiratory distress. She has no wheezes. She has no rales.  Abdominal: Soft.  Genitourinary: Uterus normal.    No labial fusion. There is lesion on the right labia. There is no rash, tenderness or injury on the right labia. There is no rash, tenderness, lesion or injury on the left labia. Right adnexum displays no mass, no tenderness and no fullness. Left adnexum displays no mass, no tenderness and no fullness. No erythema, tenderness or bleeding in the vagina. No foreign body around the vagina. No signs of injury around the vagina. No vaginal discharge found.  Musculoskeletal: Normal range of motion. She exhibits tenderness (top of left foot). She exhibits no edema.  Lymphadenopathy:    She has no cervical adenopathy.  Neurological: She is alert and oriented to person, place, and time. Coordination normal.  Skin: Skin is warm and dry. No rash noted. She is not diaphoretic. No erythema. No pallor.  Nodules on the MTC of thumb on right hand and index finger of left hand and left foot 3rd metatarsal   Wt Readings from Last 3 Encounters:  05/17/14 167 lb 6 oz (75.921 kg)  01/04/14 160 lb 4.8 oz (72.712 kg)  11/30/13 161 lb 6.4 oz (73.211 kg)      Assessment & Plan:  1. Nodule of finger of both hands - POCT SEDIMENTATION RATE - Rheumatoid factor  2. Screen for STD (sexually transmitted disease) - POCT Wet Prep with KOH - GC/Chlamydia Probe Amp - RPR - HIV antibody (with reflex) - Herpes simplex virus culture  3. Screening for thyroid disorder - TSH  4. Screening for tuberculosis Forms completed for to be foster parent. - TB Skin Test   Janan Ridgeishira Javis Abboud PA-C  [facility] 05/17/2014 1:05 PM

## 2014-05-18 LAB — HIV ANTIBODY (ROUTINE TESTING W REFLEX): HIV 1&2 Ab, 4th Generation: NONREACTIVE

## 2014-05-18 LAB — GC/CHLAMYDIA PROBE AMP
CT PROBE, AMP APTIMA: NEGATIVE
GC Probe RNA: NEGATIVE

## 2014-05-19 ENCOUNTER — Telehealth: Payer: Self-pay | Admitting: Physician Assistant

## 2014-05-19 ENCOUNTER — Ambulatory Visit (INDEPENDENT_AMBULATORY_CARE_PROVIDER_SITE_OTHER): Payer: BLUE CROSS/BLUE SHIELD

## 2014-05-19 DIAGNOSIS — Z111 Encounter for screening for respiratory tuberculosis: Secondary | ICD-10-CM

## 2014-05-19 DIAGNOSIS — Z7689 Persons encountering health services in other specified circumstances: Secondary | ICD-10-CM

## 2014-05-19 LAB — TB SKIN TEST
Induration: 0 mm
TB Skin Test: NEGATIVE

## 2014-05-19 NOTE — Progress Notes (Signed)
   Subjective:    Patient ID: Mary Wade, female    DOB: 12/01/1960, 54 y.o.   MRN: [id number]  HPI Here for PPD Read only. Read by Wardell Honouronnie Adkins, CMA.  Negative 0mm induration.    Review of Systems     Objective:   Physical Exam        Assessment & Plan:

## 2014-05-19 NOTE — Telephone Encounter (Signed)
Patient requested call back in regards to her labs while in office for TB read. Called and left vmail that the labs that have returned thus far are negative and that one test is still pending. Can call back if has any questions or concerns. Will call when all labs have returned.

## 2014-05-21 LAB — HERPES SIMPLEX VIRUS CULTURE: ORGANISM ID, BACTERIA: NOT DETECTED

## 2014-05-22 ENCOUNTER — Telehealth: Payer: Self-pay | Admitting: Physician Assistant

## 2014-05-22 ENCOUNTER — Telehealth: Payer: Self-pay | Admitting: Radiology

## 2014-05-22 NOTE — Telephone Encounter (Signed)
Pt calling about labs. [phone]

## 2014-05-22 NOTE — Telephone Encounter (Signed)
Relayed negative lab results to patient.

## 2014-06-18 ENCOUNTER — Other Ambulatory Visit: Payer: Self-pay | Admitting: Family Medicine

## 2014-06-20 ENCOUNTER — Telehealth: Payer: Self-pay | Admitting: Radiology

## 2014-06-20 NOTE — Telephone Encounter (Signed)
Called in rx and notified pt. 

## 2014-07-18 ENCOUNTER — Encounter: Payer: Self-pay | Admitting: *Deleted

## 2014-10-17 ENCOUNTER — Emergency Department (HOSPITAL_COMMUNITY): Payer: BLUE CROSS/BLUE SHIELD

## 2014-10-17 ENCOUNTER — Observation Stay (HOSPITAL_COMMUNITY)
Admission: EM | Admit: 2014-10-17 | Discharge: 2014-10-18 | Disposition: A | Discharge status: Home / Self Care | Payer: BLUE CROSS/BLUE SHIELD | Attending: Cardiology | Admitting: Cardiology

## 2014-10-17 ENCOUNTER — Ambulatory Visit (INDEPENDENT_AMBULATORY_CARE_PROVIDER_SITE_OTHER): Payer: BLUE CROSS/BLUE SHIELD | Admitting: Emergency Medicine

## 2014-10-17 ENCOUNTER — Encounter (HOSPITAL_COMMUNITY): Payer: Self-pay | Admitting: Emergency Medicine

## 2014-10-17 VITALS — BP 130/70 | HR 56 | Temp 97.6°F | Resp 16 | Ht 64.0 in | Wt 160.0 lb

## 2014-10-17 DIAGNOSIS — R109 Unspecified abdominal pain: Secondary | ICD-10-CM | POA: Insufficient documentation

## 2014-10-17 DIAGNOSIS — K859 Acute pancreatitis, unspecified: Secondary | ICD-10-CM | POA: Diagnosis not present

## 2014-10-17 DIAGNOSIS — E1169 Type 2 diabetes mellitus with other specified complication: Secondary | ICD-10-CM | POA: Diagnosis present

## 2014-10-17 DIAGNOSIS — I10 Essential (primary) hypertension: Secondary | ICD-10-CM | POA: Diagnosis present

## 2014-10-17 DIAGNOSIS — E785 Hyperlipidemia, unspecified: Secondary | ICD-10-CM | POA: Insufficient documentation

## 2014-10-17 DIAGNOSIS — R079 Chest pain, unspecified: Secondary | ICD-10-CM

## 2014-10-17 DIAGNOSIS — K219 Gastro-esophageal reflux disease without esophagitis: Secondary | ICD-10-CM | POA: Diagnosis not present

## 2014-10-17 DIAGNOSIS — R0989 Other specified symptoms and signs involving the circulatory and respiratory systems: Secondary | ICD-10-CM

## 2014-10-17 DIAGNOSIS — Q231 Congenital insufficiency of aortic valve: Secondary | ICD-10-CM

## 2014-10-17 DIAGNOSIS — Z79899 Other long term (current) drug therapy: Secondary | ICD-10-CM | POA: Diagnosis not present

## 2014-10-17 DIAGNOSIS — I493 Ventricular premature depolarization: Secondary | ICD-10-CM | POA: Insufficient documentation

## 2014-10-17 DIAGNOSIS — E782 Mixed hyperlipidemia: Secondary | ICD-10-CM | POA: Diagnosis present

## 2014-10-17 DIAGNOSIS — Z720 Tobacco use: Secondary | ICD-10-CM | POA: Insufficient documentation

## 2014-10-17 DIAGNOSIS — M542 Cervicalgia: Secondary | ICD-10-CM | POA: Insufficient documentation

## 2014-10-17 DIAGNOSIS — R61 Generalized hyperhidrosis: Secondary | ICD-10-CM | POA: Insufficient documentation

## 2014-10-17 DIAGNOSIS — R42 Dizziness and giddiness: Secondary | ICD-10-CM | POA: Insufficient documentation

## 2014-10-17 DIAGNOSIS — I351 Nonrheumatic aortic (valve) insufficiency: Secondary | ICD-10-CM | POA: Diagnosis not present

## 2014-10-17 DIAGNOSIS — R0602 Shortness of breath: Secondary | ICD-10-CM | POA: Diagnosis not present

## 2014-10-17 DIAGNOSIS — R072 Precordial pain: Secondary | ICD-10-CM

## 2014-10-17 DIAGNOSIS — Z23 Encounter for immunization: Secondary | ICD-10-CM | POA: Diagnosis not present

## 2014-10-17 DIAGNOSIS — E1159 Type 2 diabetes mellitus with other circulatory complications: Secondary | ICD-10-CM | POA: Diagnosis present

## 2014-10-17 DIAGNOSIS — R110 Nausea: Secondary | ICD-10-CM | POA: Insufficient documentation

## 2014-10-17 HISTORY — DX: Nonrheumatic aortic (valve) insufficiency: I35.1

## 2014-10-17 HISTORY — DX: Ventricular premature depolarization: I49.3

## 2014-10-17 HISTORY — DX: Tobacco use: Z72.0

## 2014-10-17 HISTORY — DX: Gastro-esophageal reflux disease without esophagitis: K21.9

## 2014-10-17 LAB — CBC
HEMATOCRIT: 36.2 % (ref 36.0–46.0)
HEMOGLOBIN: 12.3 g/dL (ref 12.0–15.0)
MCH: 31.1 pg (ref 26.0–34.0)
MCHC: 34 g/dL (ref 30.0–36.0)
MCV: 91.4 fL (ref 78.0–100.0)
Platelets: 241 10*3/uL (ref 150–400)
RBC: 3.96 MIL/uL (ref 3.87–5.11)
RDW: 12.7 % (ref 11.5–15.5)
WBC: 7.8 10*3/uL (ref 4.0–10.5)

## 2014-10-17 LAB — HEPATIC FUNCTION PANEL
ALBUMIN: 3.6 g/dL (ref 3.5–5.0)
ALT: 28 U/L (ref 14–54)
AST: 23 U/L (ref 15–41)
Alkaline Phosphatase: 53 U/L (ref 38–126)
BILIRUBIN TOTAL: 0.5 mg/dL (ref 0.3–1.2)
Bilirubin, Direct: <0.1 mg/dL — ABNORMAL LOW (ref 0.1–0.5)
Total Protein: 6.4 g/dL — ABNORMAL LOW (ref 6.5–8.1)

## 2014-10-17 LAB — I-STAT TROPONIN, ED: Troponin i, poc: 0 ng/mL (ref 0.00–0.08)

## 2014-10-17 LAB — BASIC METABOLIC PANEL
Anion gap: 7 (ref 5–15)
BUN: 14 mg/dL (ref 6–20)
CHLORIDE: 105 mmol/L (ref 101–111)
CO2: 26 mmol/L (ref 22–32)
Calcium: 9 mg/dL (ref 8.9–10.3)
Creatinine, Ser: 0.56 mg/dL (ref 0.44–1.00)
GFR calc non Af Amer: >60 mL/min (ref >60)
Glucose, Bld: 92 mg/dL (ref 65–99)
POTASSIUM: 4 mmol/L (ref 3.5–5.1)
SODIUM: 138 mmol/L (ref 135–145)

## 2014-10-17 LAB — LIPASE, BLOOD: LIPASE: 16 U/L — AB (ref 22–51)

## 2014-10-17 LAB — TSH: TSH: 2.295 u[IU]/mL (ref 0.350–4.500)

## 2014-10-17 LAB — TROPONIN I: Troponin I: <0.03 ng/mL (ref <0.031)

## 2014-10-17 MED ORDER — NITROGLYCERIN 0.4 MG SL SUBL
0.4000 mg | SUBLINGUAL_TABLET | SUBLINGUAL | Status: DC | PRN
  Filled 2014-10-17: qty 1

## 2014-10-17 MED ORDER — NITROGLYCERIN 0.3 MG SL SUBL: 0.4000 mg | SUBLINGUAL_TABLET | SUBLINGUAL | Status: DC | PRN

## 2014-10-17 MED ORDER — FAMOTIDINE 20 MG PO TABS
40.0000 mg | ORAL_TABLET | Freq: Every day | ORAL | Status: DC
  Administered 2014-10-17 – 2014-10-18 (×2): 40 mg via ORAL
  Filled 2014-10-17 (×3): qty 2

## 2014-10-17 MED ORDER — HYDROCODONE-ACETAMINOPHEN 5-325 MG PO TABS
1.0000 | ORAL_TABLET | Freq: Four times a day (QID) | ORAL | Status: DC | PRN
  Administered 2014-10-17: 1 via ORAL
  Filled 2014-10-17: qty 1

## 2014-10-17 MED ORDER — SODIUM CHLORIDE 0.9 % IV SOLN: 250.0000 mL | INTRAVENOUS | Status: DC | PRN

## 2014-10-17 MED ORDER — ACETAMINOPHEN 325 MG PO TABS: 650.0000 mg | ORAL_TABLET | ORAL | Status: DC | PRN

## 2014-10-17 MED ORDER — ASPIRIN 81 MG PO CHEW
324.0000 mg | CHEWABLE_TABLET | Freq: Once | ORAL | Status: AC
  Administered 2014-10-17: 324 mg via ORAL

## 2014-10-17 MED ORDER — PROMETHAZINE HCL 25 MG PO TABS
25.0000 mg | ORAL_TABLET | Freq: Four times a day (QID) | ORAL | Status: DC | PRN
  Administered 2014-10-17: 25 mg via ORAL
  Filled 2014-10-17: qty 1

## 2014-10-17 MED ORDER — BISOPROLOL-HYDROCHLOROTHIAZIDE 10-6.25 MG PO TABS
1.0000 | ORAL_TABLET | Freq: Every day | ORAL | Status: DC
  Administered 2014-10-18: 1 via ORAL
  Filled 2014-10-17: qty 1

## 2014-10-17 MED ORDER — GI COCKTAIL ~~LOC~~
30.0000 mL | Freq: Once | ORAL | Status: AC
  Administered 2014-10-17: 30 mL via ORAL

## 2014-10-17 MED ORDER — SODIUM CHLORIDE 0.9 % IJ SOLN: 3.0000 mL | INTRAMUSCULAR | Status: DC | PRN

## 2014-10-17 MED ORDER — ONDANSETRON HCL 4 MG/2ML IJ SOLN: 4.0000 mg | Freq: Four times a day (QID) | INTRAMUSCULAR | Status: DC | PRN

## 2014-10-17 MED ORDER — ASPIRIN EC 81 MG PO TBEC
81.0000 mg | DELAYED_RELEASE_TABLET | Freq: Every day | ORAL | Status: DC
  Administered 2014-10-18: 81 mg via ORAL
  Filled 2014-10-17: qty 1

## 2014-10-17 MED ORDER — ONDANSETRON HCL 4 MG/2ML IJ SOLN
4.0000 mg | Freq: Once | INTRAMUSCULAR | Status: AC
  Administered 2014-10-17: 4 mg via INTRAVENOUS
  Filled 2014-10-17: qty 2

## 2014-10-17 MED ORDER — PANTOPRAZOLE SODIUM 40 MG PO TBEC
40.0000 mg | DELAYED_RELEASE_TABLET | Freq: Two times a day (BID) | ORAL | Status: DC
  Administered 2014-10-17 – 2014-10-18 (×2): 40 mg via ORAL
  Filled 2014-10-17 (×2): qty 1

## 2014-10-17 MED ORDER — SODIUM CHLORIDE 0.9 % IJ SOLN
3.0000 mL | Freq: Two times a day (BID) | INTRAMUSCULAR | Status: DC
Start: 2014-10-17 — End: 2014-10-18
  Administered 2014-10-17 – 2014-10-18 (×2): 3 mL via INTRAVENOUS

## 2014-10-17 NOTE — ED Notes (Signed)
Patient transported to X-ray 

## 2014-10-17 NOTE — ED Provider Notes (Signed)
Medical screening examination/treatment/procedure(s) were conducted as a shared visit with non-physician practitioner(s) and myself.  I personally evaluated the patient during the encounter.   EKG Interpretation   Date/Time:  Thursday October 17 2014 13:20:21 EDT Ventricular Rate:  52 PR Interval:  172 QRS Duration: 82 QT Interval:  480 QTC Calculation: 446 R Axis:   52 Text Interpretation:  Sinus bradycardia with sinus arrhythmia Otherwise  normal ECG No significant change since last tracing Confirmed by Korryn Pancoast   MD, Landyn Buckalew ([id number]) on 10/17/2014 3:32:00 PM     Patient here after having a sudden onset of left-sided chest pain characterized as pressure. Was given multiple medications including GI cocktail aspirin and nitroglycerin. Patient's EKG without acute findings. Pain is somewhat improved. Will obtain cardiology consultation  Lorre Nick, MD 10/17/14 [phone]

## 2014-10-17 NOTE — ED Provider Notes (Signed)
CSN: [id number]     Arrival date & time 10/17/14  1318 History   First MD Initiated Contact with Patient 10/17/14 1323     Chief Complaint  Patient presents with  . Chest Pain   HPI  Mary Wade is a 54 year old female with PMHx of HTN, HLD and bicuspid aortic valve presenting today with left sided chest and abdominal pain. Pt reports pain began this morning and describes it as crushing and it radiates through to her back. Pt thought it was her GERD and took omeprazole with no relief. Pt states her typical GERD symptoms are severe right sided chest pain. Pt told to go to urgent care by her office who then sent her here. She was given a GI cocktail, aspirin and nitro before arrival to the ED and reports her pain level was brought down to a 2/10. Pt was not engaged in exertional activity when chest pain started. Endorses diaphoresis, lightheadedness, SOB and nausea. Denies fevers, headaches, vision changes and vomiting. Reports a month long history of daily diarrhea. Pt has never been formally worked up for GERD or diarrhea. Family history of heart disease in mother and father; father died of MI at 3.   Past Medical History  Diagnosis Date  . Hypertension   . Bicuspid aortic valve     a. 10/24/13 showed EF 55-60%, no RWMA, bicuspid AV with mod AI, mildly dilated ascending aorta, PASP .  Marland Kitchen Pancreatitis     2 previous episodes, last one 2013  . Hyperlipidemia   . Aortic insufficiency     a. 10/2013: mod AI by echo.  Marland Kitchen GERD (gastroesophageal reflux disease)   . PVC's (premature ventricular contractions)     a. 10/2013 event monitor: NSR and PVCs.  . Tobacco abuse    Past Surgical History  Procedure Laterality Date  . Appendectomy    . Abdominal hysterectomy    . Cholecystectomy     Family History  Problem Relation Age of Onset  . CAD Mother     Died at 35, several blockages but patient unknown when this started  . Heart disease Father     Died of MI at age 61 - enlarged heart  .  Aneurysm Mother     Brain aneurysm - died of this during surgery   Social History  Substance Use Topics  . Smoking status: Current Every Day Smoker -- 1.00 packs/day for 40 years    Types: Cigarettes  . Smokeless tobacco: Never Used  . Alcohol Use: 0.0 oz/week    0 Standard drinks or equivalent per week     Comment: twice per week - 2-6 beers on each occasion   OB History    No data available     Review of Systems  Constitutional: Positive for diaphoresis. Negative for fever and chills.  Respiratory: Positive for shortness of breath. Negative for chest tightness.   Cardiovascular: Positive for chest pain. Negative for palpitations.  Gastrointestinal: Positive for nausea, abdominal pain and diarrhea. Negative for vomiting.  Genitourinary: Negative for dysuria and hematuria.  Musculoskeletal: Positive for back pain.  Neurological: Positive for light-headedness. Negative for dizziness, syncope, weakness and headaches.      Allergies  Niacin and related and Pravastatin  Home Medications   Prior to Admission medications   Medication Sig Start Date End Date Taking? Authorizing Provider  bisoprolol-hydrochlorothiazide (ZIAC) 10-6.25 MG per tablet Take 1 tablet by mouth daily. 02/27/14  Yes Thao P Le, DO  HYDROcodone-acetaminophen (NORCO/VICODIN) 5-325  MG per tablet Take 1 tablet by mouth every 6 (six) hours as needed for moderate pain.   Yes Historical Provider, MD  ibuprofen (ADVIL,MOTRIN) 200 MG tablet Take 200 mg by mouth every 6 (six) hours as needed for mild pain or moderate pain.   Yes Historical Provider, MD  omeprazole (PRILOSEC) 20 MG capsule Take 20 mg by mouth daily.   Yes Historical Provider, MD  promethazine (PHENERGAN) 25 MG tablet Take 25 mg by mouth every 6 (six) hours as needed for nausea or vomiting.   Yes Historical Provider, MD   BP 155/65 mmHg  Pulse 58  Temp(Src) 98.1 F (36.7 C) (Oral)  Resp 18  Ht  (1.626 m)  Wt 166 lb 8 oz (75.524 kg)  BMI 28.57  kg/m2  SpO2 98% Physical Exam  Constitutional: She is oriented to person, place, and time. She appears well-developed and well-nourished. No distress.  HENT:  Head: Normocephalic and atraumatic.  Eyes: Conjunctivae and EOM are normal. Pupils are equal, round, and reactive to light.  Neck: Normal range of motion.  Cardiovascular: Normal rate, regular rhythm and normal heart sounds.   Pulmonary/Chest: Effort normal and breath sounds normal. No respiratory distress. She has no wheezes.  Abdominal: Soft. She exhibits no distension. There is tenderness (LUQ). There is no rebound and no guarding.  Musculoskeletal: Normal range of motion.  Neurological: She is alert and oriented to person, place, and time. No cranial nerve deficit.  Skin: Skin is warm and dry.  Psychiatric: She has a normal mood and affect. Her behavior is normal.  Nursing note and vitals reviewed.   ED Course  Procedures (including critical care time) Labs Review Labs Reviewed  LIPASE, BLOOD - Abnormal; Notable for the following:    Lipase 16 (*)    All other components within normal limits  BASIC METABOLIC PANEL  CBC  TROPONIN I  TROPONIN I  TROPONIN I  I-STAT TROPOININ, ED    Imaging Review Dg Chest 2 View  10/17/2014   CLINICAL DATA:  Left chest pain beneath the breast.  Hypertension.  EXAM: CHEST  2 VIEW  COMPARISON:  01/28/2014  FINDINGS: The heart size and mediastinal contours are within normal limits. Both lungs are clear. The visualized skeletal structures are unremarkable.  IMPRESSION: No active cardiopulmonary disease.   Electronically Signed   By: Gaylyn Rong M.D.   On: 10/17/2014 14:47   I have personally reviewed and evaluated these images and lab results as part of my medical decision-making.   EKG Interpretation   Date/Time:  Thursday October 17 2014 13:20:21 EDT Ventricular Rate:  52 PR Interval:  172 QRS Duration: 82 QT Interval:  480 QTC Calculation: 446 R Axis:   52 Text  Interpretation:  Sinus bradycardia with sinus arrhythmia Otherwise  normal ECG No significant change since last tracing Confirmed by ALLEN   MD, ANTHONY ([id number]) on 10/17/2014 3:32:00 PM      MDM   Final diagnoses:  Chest pain, unspecified chest pain type   Pt presenting today with acute onset left sided chest and abdominal pain. Pt has history of severe GERD and believed this to be the cause of her symptoms, took omeprazole at home with no relief. Pain is severe and described as crushing pressure. Pain radiates to the back. Associated with diaphoresis, lightheadedness, SOB and nausea. Pt originally went to an urgent care and received GI cocktail, nitroglycerin and aspirin. Pain brought down to a 2/10. Pt only complaint currently is nausea. VSS. Pt  nontoxic. EKG normal and negative troponin. No active cardiopulmonary on CXR. Mild LUQ tenderness on exam. Cardiology consult assessed pt in ED and will admit her to cardiology for further monitoring     Alveta Heimlich, PA-C 10/17/14 1919  Lorre Nick, MD 10/18/14 [phone]

## 2014-10-17 NOTE — ED Notes (Signed)
EMS - Patient coming from [facility] with c/o of right sided chest pain with shift to the right sternum.  GI cocktain, 324 aspirin and 2 Nitro.  Pain is 2/10 pain.  Pain is intermittent since today at 9:30.

## 2014-10-17 NOTE — Progress Notes (Signed)
Peripheral IV started per Dr. Ewell Poe verbal order. Deliah Boston, MS, PA-C   12:28 PM, 10/17/2014

## 2014-10-17 NOTE — Progress Notes (Signed)
Subjective:  Patient ID: Mary Wade, female    DOB: 1960-03-26  Age: 54 y.o. MRN: [id number]  CC: Chest Pain; Abdominal Pain; and Gastrophageal Reflux   HPI [name] presents  with chest pain. Said the pain lasted for an hour, radiating through her back. She took her usual dose of Prilosec this morning and was so affected by the pain in her chest she went to a fire station and had EMTs run an EKG on her which they she says they informed her was "normal". They offered to transfer to the hospital. She said she has a history of pancreatitis and severe GERD.  She's a 1 pack-a-day smoker has hypertension and hypercholesterolemia. She is overweight middle-aged and is postmenopausal. She has no documented cardiovascular history. She said she has nausea associated with the pain. Says the pain does not radiate her neck to the jaw or down her arm. He goes straight through her back.  History Mary Wade has a past medical history of Hypertension; Bicuspid aortic valve; Pancreatitis; and Hyperlipidemia.   She has past surgical history that includes Appendectomy; Abdominal hysterectomy; and Cholecystectomy.   Her  family history includes Heart disease in her father and mother.  She   reports that she has been smoking Cigarettes.  She has a 20 pack-year smoking history. She has never used smokeless tobacco. She reports that she drinks about 2.4 oz of alcohol per week. She reports that she does not use illicit drugs.  Facility-Administered Medications Prior to Visit  Medication Dose Route Frequency Provider Last Rate Last Dose  . promethazine (PHENERGAN) injection 25 mg  25 mg Intramuscular Q6H PRN Lamar Laundry, MD   25 mg at 06/03/11 1856   Outpatient Prescriptions Prior to Visit  Medication Sig Dispense Refill  . bisoprolol-hydrochlorothiazide (ZIAC) 10-6.25 MG per tablet Take 1 tablet by mouth daily. 90 tablet 1  . omeprazole (PRILOSEC) 20 MG capsule Take 20 mg by mouth daily.    Marland Kitchen  ALPRAZolam (XANAX) 0.5 MG tablet TAKE 1 TABLET AT BEDTIME AS NEEDED FOR ANXIETY/SLEEP (Patient not taking: Reported on 10/17/2014) 30 tablet 1  . Dextromethorphan-Guaifenesin (MUCINEX DM MAXIMUM STRENGTH) 60-1200 MG TB12 Take 1 tablet by mouth every 12 (twelve) hours. 20 each 0  . Omega-3 Fatty Acids (FISH OIL) 1000 MG CAPS Take 2 capsules (2,000 mg total) by mouth 2 (two) times daily.      Social History   Social History  . Marital Status: Single    Spouse Name: N/A  . Number of Children: 2  . Years of Education: N/A   Occupational History  .      Dental office   Social History Main Topics  . Smoking status: Current Every Day Smoker -- 0.50 packs/day for 40 years    Types: Cigarettes  . Smokeless tobacco: Never Used  . Alcohol Use: 2.4 oz/week    4 Standard drinks or equivalent per week     Comment: sometimes  . Drug Use: No  . Sexual Activity: Yes   Other Topics Concern  . None   Social History Narrative     Review of Systems  Constitutional: Negative for fever, chills and appetite change.  HENT: Negative for congestion, ear pain, postnasal drip, sinus pressure and sore throat.   Eyes: Negative for pain and redness.  Respiratory: Positive for chest tightness. Negative for cough, shortness of breath and wheezing.   Cardiovascular: Positive for chest pain. Negative for leg swelling.  Gastrointestinal: Positive for nausea. Negative  for vomiting, abdominal pain, diarrhea, constipation and blood in stool.  Endocrine: Negative for polyuria.  Genitourinary: Negative for dysuria, urgency, frequency and flank pain.  Musculoskeletal: Negative for gait problem.  Skin: Negative for rash.  Neurological: Positive for dizziness. Negative for weakness and headaches.  Psychiatric/Behavioral: Negative for confusion and decreased concentration. The patient is not nervous/anxious.     Objective:  BP 130/70 mmHg  Pulse 56  Temp(Src) 97.6 F (36.4 C) (Oral)  Resp 16  Ht  (1.626  m)  Wt 160 lb (72.576 kg)  BMI 27.45 kg/m2  SpO2 98%  Physical Exam  Constitutional: She is oriented to person, place, and time. She appears well-developed and well-nourished. No distress.  HENT:  Head: Normocephalic and atraumatic.  Right Ear: External ear normal.  Left Ear: External ear normal.  Nose: Nose normal.  Eyes: Conjunctivae and EOM are normal. Pupils are equal, round, and reactive to light. No scleral icterus.  Neck: Normal range of motion. Neck supple. No tracheal deviation present.  Cardiovascular: Normal rate, regular rhythm and normal heart sounds.   Pulmonary/Chest: Effort normal. No respiratory distress. She has no wheezes. She has no rales.  Abdominal: She exhibits no mass. There is no tenderness. There is no rebound and no guarding.  Musculoskeletal: She exhibits no edema.  Lymphadenopathy:    She has no cervical adenopathy.  Neurological: She is alert and oriented to person, place, and time. Coordination normal.  Skin: Skin is warm and dry. No rash noted.  Psychiatric: She has a normal mood and affect. Her behavior is normal.      Assessment & Plan:   Mary Wade was seen today for chest pain, abdominal pain and gastrophageal reflux.  Diagnoses and all orders for this visit:  Chest pain, unspecified chest pain type -     EKG 12-Lead   I have discontinued Ms. Opdahl's Fish Oil and MUCINEX DM MAXIMUM STRENGTH. I am also having her maintain her omeprazole, bisoprolol-hydrochlorothiazide, and ALPRAZolam. We will continue to administer promethazine.  No orders of the defined types were placed in this encounter.    Appropriate red flag conditions were discussed with the patient as well as actions that should be taken.  Patient expressed his understanding.  Follow-up: No Follow-up on file.  Carmelina Dane, MD

## 2014-10-17 NOTE — ED Notes (Signed)
Patient returned from X-ray 

## 2014-10-17 NOTE — Addendum Note (Signed)
Addended by: Roosvelt Harps R on: 10/17/2014 01:54 PM   Modules accepted: Orders

## 2014-10-17 NOTE — H&P (Signed)
Cardiology H&P  Patient ID: Mary Wade, MRN: [id number], DOB/AGE: May 13, 1960 54 y.o. Admit date: 10/17/2014   Date of Consult: 10/17/2014 Primary Physician: Rockne Coons, DO Primary Cardiologist: Dr. Jens Som  Chief Complaint: chest pain Reason for Consultation: chest pain  HPI: Mary Wade is a 54 y/o F with history of ongoing tobacco abuse, HTN, HLD (untreated), GERD, bicuspid aortic valve with moderate AI, PVCs by event monitor in 10/2013, pancreatitis, occasional alcohol use who presented to [facility] today from urgent care with chest pain. Per Dr. Ludwig Clarks note, nuclear study 2003 was normal with EF 60% and ABIs in 07/2013 were normal. Last echo 10/24/13 showed EF 55-60%, no RWMA, bicuspid AV with mod AI, mildly dilated ascending aorta, PASP . MRI 01/2014 - no evidence of thoracic aortic aneurysm or dissection.   Her last episode of pancreatitis was in 2013, at which time she started taking omeprazole with significant reduction in GERD symptoms. When she has GERD attacks they usually present as right sided pain and are relieved with phenergan, hydrocodone, and sleep. However, she says for the last month or so her stomach has really hurt. She has also had quite a bit of diarrhea. She drinks 2-6 beers two days a week. She has also felt more fatigued than usual. Today while at work around 8-9am she developed left sided sharp chest discomfort that went to her back. 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 She took her 2nd omeprazole but it didn't do anything.  It felt similar to prior GERD symptoms except was different in several ways: 1) GERD pain is usually on the right and 2) this was associated with SOB, nausea, clamminess, left sided neck discomfort. She went back to work but her boss told her she should go be checked out. She initially went into Dr. Jeannetta Nap' office and was told they weren't taking new patients, so she then went  to the fire station. The fire station called an ambulance but she refused. Her sister came and picked her up and drove her to [place]. She received  ASA. She then received GI cocktail which she said made her feel worse with dry heaving. She then received 1 SL NTG which she felt very gradual relief with. At peak, pain was 8/10. She says she can "still feel something" - rates it about a 2/10. She says she just ate some crackers and it made it feel better. She has not had any orthopnea, LEE, weight loss (actually has gained a few lbs in the last several weeks), BRBPR, melena, or hematemesis. She has never had formal workup for her GERD. She does not exercise or do much activity - but says she did Pilates 3 weeks ago without limitation.  In the ER, initial BMET, CBC, lipase, troponin are unremarkable. CXR: no active cardiopulmonary disease, normal mediastinal contours. Urgent care EMS shows TW flattening in lead III. EMS EKG shows NSR with TWI in III. F/u EKGs here in the ER are nonacute, normal TWI III. Father died at 66 of "enlarged heart" (unclear if +CAD), mother died in her 41s with many years of blockages, unclear which age she was diagnosed.  Past Medical History  Diagnosis Date  . Hypertension   . Bicuspid aortic valve     a. 10/24/13 showed EF 55-60%, no RWMA, bicuspid AV with mod AI, mildly dilated ascending aorta, PASP .  Marland Kitchen Pancreatitis     2 previous  episodes, last one 2013  . Hyperlipidemia   . Aortic insufficiency     a. 10/2013: mod AI by echo.  Marland Kitchen GERD (gastroesophageal reflux disease)   . PVC's (premature ventricular contractions)     a. 10/2013 event monitor: NSR and PVCs.  . Tobacco abuse       Most Recent Cardiac Studies: 2D Echo 10/24/13 - Left ventricle: The cavity size was normal. Systolic function was normal. The estimated ejection fraction was in the range of 55% to 60%. Wall motion was normal; there were no regional wall motion abnormalities. - Aortic valve:  Bicuspid; normal thickness leaflets. There was moderate regurgitation directed eccentrically in the LVOT. - Ascending aorta: The ascending aorta was mildly dilated. - Mitral valve: There was trivial regurgitation. - Tricuspid valve: There was trivial regurgitation. - Pulmonary arteries: PA peak pressure: 32 mm Hg (S). Impressions: - The right ventricular systolic pressure was increased consistent with mild pulmonary hypertension.   Surgical History:  Past Surgical History  Procedure Laterality Date  . Appendectomy    . Abdominal hysterectomy    . Cholecystectomy       Home Meds: Prior to Admission medications   Medication Sig Start Date End Date Taking? Authorizing Provider  bisoprolol-hydrochlorothiazide (ZIAC) 10-6.25 MG per tablet Take 1 tablet by mouth daily. 02/27/14  Yes Thao P Le, DO  HYDROcodone-acetaminophen (NORCO/VICODIN) 5-325 MG per tablet Take 1 tablet by mouth every 6 (six) hours as needed for moderate pain.   Yes Historical Provider, MD  ibuprofen (ADVIL,MOTRIN) 200 MG tablet Take 200 mg by mouth every 6 (six) hours as needed for mild pain or moderate pain.   Yes Historical Provider, MD  omeprazole (PRILOSEC) 20 MG capsule Take 20 mg by mouth daily.   Yes Historical Provider, MD  promethazine (PHENERGAN) 25 MG tablet Take 25 mg by mouth every 6 (six) hours as needed for nausea or vomiting.   Yes Historical Provider, MD  ALPRAZolam Prudy Feeler) 0.5 MG tablet TAKE 1 TABLET AT BEDTIME AS NEEDED FOR ANXIETY/SLEEP Patient not taking: Reported on 10/17/2014 06/20/14   Mary Antu, DO    Inpatient Medications:       Allergies:  Allergies  Allergen Reactions  . Niacin And Related     flush  . Pravastatin     Muscle aches, elevated LFTs    Social History   Social History  . Marital Status: Single    Spouse Name: N/A  . Number of Children: 2  . Years of Education: N/A   Occupational History  .      Dental office   Social History Main Topics  . Smoking status:  Current Every Day Smoker -- 1.00 packs/day for 40 years    Types: Cigarettes  . Smokeless tobacco: Never Used  . Alcohol Use: 0.0 oz/week    0 Standard drinks or equivalent per week     Comment: twice per week - 2-6 beers on each occasion  . Drug Use: No  . Sexual Activity: Yes   Other Topics Concern  . Not on file   Social History Narrative     Family History  Problem Relation Age of Onset  . CAD Mother     Died at 52, several blockages but patient unknown when this started  . Heart disease Father     Died of MI at age 52 - enlarged heart  . Aneurysm Mother     Brain aneurysm - died of this during surgery     Review  of Systems: No chills, fever. All other systems reviewed and are otherwise negative except as noted above.  Labs:  Troponin neg x 1.  Lab Results  Component Value Date   WBC 7.8 10/17/2014   HGB 12.3 10/17/2014   HCT 36.2 10/17/2014   MCV 91.4 10/17/2014   PLT 241 10/17/2014     Recent Labs Lab 10/17/14 1506  NA 138  K 4.0  CL 105  CO2 26  BUN 14  CREATININE 0.56  CALCIUM 9.0  GLUCOSE 92    No results found for: DDIMER  Radiology/Studies:  Dg Chest 2 View  10/17/2014   CLINICAL DATA:  Left chest pain beneath the breast.  Hypertension.  EXAM: CHEST  2 VIEW  COMPARISON:  01/28/2014  FINDINGS: The heart size and mediastinal contours are within normal limits. Both lungs are clear. The visualized skeletal structures are unremarkable.  IMPRESSION: No active cardiopulmonary disease.   Electronically Signed   By: Gaylyn Rong M.D.   On: 10/17/2014 14:47    Wt Readings from Last 3 Encounters:  10/17/14 160 lb (72.576 kg)  05/17/14 167 lb 6 oz (75.921 kg)  01/04/14 160 lb 4.8 oz (72.712 kg)    EKG:  Urgent care: sinus bradycardia 59bpm with TW flattening in III, otherwise nonacute EMS: sinus arrhythmia 54bpm, TWI in lead III, otherwise nonacute F/u EKGs in ER: sinus bradycardia with sinus arrhythmia 53bpm, TW flattening AVL  Physical  Exam: Blood pressure 136/84, pulse 55, temperature 98 F (36.7 C), temperature source Oral, resp. rate 11, SpO2 96 %. General: Well developed, well nourished WF in no acute distress. Head: Normocephalic, atraumatic, sclera non-icteric, no xanthomas, nares are without discharge.  Neck: Faint right carotid bruit. JVD not elevated. Lungs: Clear bilaterally to auscultation without wheezes, rales, or rhonchi. Breathing is unlabored. Heart: RRR with S1 S2. No significant murmurs, rubs, gallops appreciated. Abdomen: Soft, non-tender, non-distended with normoactive bowel sounds. No hepatomegaly. No rebound/guarding. No obvious abdominal masses. Msk:  Strength and tone appear normal for age. Extremities: No clubbing or cyanosis. No edema.  Distal pedal pulses are 2+ and equal bilaterally. 2+ UE pulses equal bilaterally. Neuro: Alert and oriented X 3. No facial asymmetry. No focal deficit. Moves all extremities spontaneously. Psych:  Responds to questions appropriately with a normal affect.    Assessment and Plan:   1. Chest pain - typical and atypical features. She feels this is similar to her GERD symptoms but with other qualities that she has not had before such as SOB, nausea, and clamminess. Troponin negative so far despite persistent pain. Will admit, cycle troponins, and add scheduled aspirin. Will add H2 blocker to regimen for now and increase PPI. Plan on GXT tomorrow if she rules out. If feeling better and study is normal, plan d/c with outpatient GI referral.  2. H/o bicuspid aortic valve with mod AI - CXR normal, pulses equal, not hypertensive or hypotensive. Can f/u echo as outpatient.  3. Essential HTN, generally controlled in ER - continue current regimen.  4. Hyperlipidemia - check lipids/LFTs. Last LDL in 2015 was 140. Per patient, pravastatin increased her LFTs. Could try a trial of a different medicine with close monitoring if LFTs OK.  5. Ongoing tobacco abuse - counseled regarding  cessation.   6. Right carotid bruit - may be radiation from valve but is worthwhile to pursue outpatient dopplers.  DVT ppx: will use SCDs in case of active gastric pathology.  Thomasene Mohair PA-C 10/17/2014, 4:50 PM Pager: [phone]  History and all data above reviewed.  Patient examined.  I agree with the findings as above. Her chest pain is somewhat atypical and similar to previous reflux.  However, she has multiple risk factors.   The patient exam reveals COR:RRR  ,  Lungs: Clear  ,  Abd: Positive bowel sounds, no rebound no guarding, Ext No edema  .  All available labs, radiology testing, previous records reviewed. Agree with documented assessment and plan. Chest pain:  Observe overnight.  If he enzymes are negative I will screen with a POET (Plain Old Exercise Treadmill) prior to discharge.    Fayrene Fearing Jaline Pincock  5:45 PM  10/17/2014

## 2014-10-17 NOTE — ED Notes (Signed)
Attempted report 

## 2014-10-18 ENCOUNTER — Other Ambulatory Visit: Payer: Self-pay | Admitting: *Deleted

## 2014-10-18 ENCOUNTER — Observation Stay (HOSPITAL_BASED_OUTPATIENT_CLINIC_OR_DEPARTMENT_OTHER): Payer: BLUE CROSS/BLUE SHIELD

## 2014-10-18 ENCOUNTER — Other Ambulatory Visit: Payer: Self-pay | Admitting: Physician Assistant

## 2014-10-18 DIAGNOSIS — Q231 Congenital insufficiency of aortic valve: Secondary | ICD-10-CM

## 2014-10-18 DIAGNOSIS — R072 Precordial pain: Secondary | ICD-10-CM

## 2014-10-18 DIAGNOSIS — K219 Gastro-esophageal reflux disease without esophagitis: Secondary | ICD-10-CM

## 2014-10-18 LAB — TROPONIN I: Troponin I: <0.03 ng/mL (ref <0.031)

## 2014-10-18 LAB — BASIC METABOLIC PANEL
Anion gap: 6 (ref 5–15)
BUN: 12 mg/dL (ref 6–20)
CHLORIDE: 106 mmol/L (ref 101–111)
CO2: 27 mmol/L (ref 22–32)
CREATININE: 0.55 mg/dL (ref 0.44–1.00)
Calcium: 8.5 mg/dL — ABNORMAL LOW (ref 8.9–10.3)
GFR calc Af Amer: >60 mL/min (ref >60)
GFR calc non Af Amer: >60 mL/min (ref >60)
GLUCOSE: 112 mg/dL — AB (ref 65–99)
Potassium: 3.9 mmol/L (ref 3.5–5.1)
SODIUM: 139 mmol/L (ref 135–145)

## 2014-10-18 LAB — LIPID PANEL
CHOL/HDL RATIO: 7.1 ratio
CHOLESTEROL: 233 mg/dL — AB (ref 0–200)
HDL: 33 mg/dL — ABNORMAL LOW (ref >40)
LDL Cholesterol: 122 mg/dL — ABNORMAL HIGH (ref 0–99)
TRIGLYCERIDES: 388 mg/dL — AB (ref <150)
VLDL: 78 mg/dL — AB (ref 0–40)

## 2014-10-18 LAB — EXERCISE TOLERANCE TEST
CHL CUP RESTING HR STRESS: 67 {beats}/min
CSEPED: 6 min
CSEPEDS: 30 s
CSEPPHR: 151 {beats}/min
Estimated workload: 8.5 METS
RPE: 17

## 2014-10-18 LAB — CBC
HCT: 36 % (ref 36.0–46.0)
Hemoglobin: 11.9 g/dL — ABNORMAL LOW (ref 12.0–15.0)
MCH: 30.7 pg (ref 26.0–34.0)
MCHC: 33.1 g/dL (ref 30.0–36.0)
MCV: 92.8 fL (ref 78.0–100.0)
PLATELETS: 215 10*3/uL (ref 150–400)
RBC: 3.88 MIL/uL (ref 3.87–5.11)
RDW: 12.9 % (ref 11.5–15.5)
WBC: 6.6 10*3/uL (ref 4.0–10.5)

## 2014-10-18 MED ORDER — FAMOTIDINE 40 MG PO TABS
40.0000 mg | ORAL_TABLET | Freq: Every day | ORAL | Status: DC
Start: 2014-10-18 — End: 2014-11-07

## 2014-10-18 MED ORDER — INFLUENZA VAC SPLIT QUAD 0.5 ML IM SUSY
0.5000 mL | PREFILLED_SYRINGE | INTRAMUSCULAR | Status: AC
  Administered 2014-10-18: 0.5 mL via INTRAMUSCULAR
  Filled 2014-10-18: qty 0.5

## 2014-10-18 MED ORDER — PNEUMOCOCCAL VAC POLYVALENT 25 MCG/0.5ML IJ INJ
0.5000 mL | INJECTION | INTRAMUSCULAR | Status: AC
  Administered 2014-10-18: 0.5 mL via INTRAMUSCULAR
  Filled 2014-10-18: qty 0.5

## 2014-10-18 NOTE — Discharge Summary (Signed)
Discharge Summary   Patient ID: Mary Wade,  MRN: [id number], DOB/AGE: [birth year] 54 y.o.  Admit date: 10/17/2014 Discharge date: 10/18/2014  Primary Care Provider: [facility] Primary Cardiologist: Dr. Jens Som  Discharge Diagnoses Active Problems:   Bicuspid aortic valve   Hyperlipidemia   Tobacco abuse   Essential hypertension   Right carotid bruit   Chest pain   Allergies Allergies  Allergen Reactions  . Niacin And Related     flush  . Pravastatin     Muscle aches, elevated LFTs    Procedures  10/18/14: Exercise Tolerance Test  ECG Baseline ECG is normal..   Stress Findings The patient exercised following the Bruce protocol.  The patient reported shortness of breath and palpitations during the stress test. The patient experienced no angina during the stress test.   Test was stopped per protocol.   Blood pressure and heart rate demonstrated a normal response to exercise. Overall, the patient's exercise capacity was normal.   Recovery time: 3 minutes. The patient's response to exercise was adequate for diagnosis. She felt PVCs as "shaking heart".   Response to Stress There was no ST segment deviation noted during stress.  No T wave inversion was noted during stress. Arrhythmias during stress: occasional PVCs.  Arrhythmias during recovery: frequent PVCs. Trigemeny  Arrhythmias were not significant.  ECG was interpretable and conclusive.     History of Present Illness  Mary Wade is a 54 y/o F with history of ongoing tobacco abuse, HTN, HLD (untreated), GERD, bicuspid aortic valve with moderate AI, PVCs by event monitor in 10/2013, pancreatitis, occasional alcohol use who presented to [facility] 10/17/14 from urgent care with chest pain. Per Dr. Ludwig Clarks note, nuclear study 2003 was normal with EF 60% and ABIs in 07/2013 were normal. Last echo 10/24/13 showed EF 55-60%, no RWMA, bicuspid AV with mod AI, mildly dilated ascending aorta, PASP . MRI  01/2014 - no evidence of thoracic aortic aneurysm or dissection.   Her last episode of pancreatitis was in 2013, at which time she started taking omeprazole with significant reduction in GERD symptoms. When she has GERD attacks they usually present as right sided pain and are relieved with phenergan, hydrocodone, and sleep. However, she says for the last month or so her stomach has really hurt. She has also had quite a bit of diarrhea. She drinks 2-6 beers two days a week. She has also felt more fatigued than usual. While at work 10/17/14 around 8-9am she developed left sided sharp chest discomfort that went to her back. 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 She took her 2nd omeprazole but it didn't do anything. It felt similar to prior GERD symptoms except was different in several ways: 1) GERD pain is usually on the right and 2) this was associated with SOB, nausea, clamminess, left sided neck discomfort. She went back to work but her boss told her she should go be checked out. She initially went into Dr. Jeannetta Nap' office and was told they weren't taking new patients, so she then went to the fire station. The fire station called an ambulance but she refused. Her sister came and picked her up and drove her to [place]. She received 324mg  ASA. She then received GI cocktail which she said made her feel worse with dry heaving. She then received 1 SL NTG which she felt very gradual relief with. At peak, pain was 8/10. She  says she can "still feel something" - rates it about a 2/10. She says she just ate some crackers and it made it feel better. She has not had any orthopnea, LEE, weight loss (actually has gained a few lbs in the last several weeks), BRBPR, melena, or hematemesis. She has never had formal workup for her GERD. She does not exercise or do much activity.  In the ER, initial BMET, CBC, lipase, troponin are unremarkable. CXR: no active cardiopulmonary  disease, normal mediastinal contours. Urgent care EMS shows TW flattening in lead III. EMS EKG shows NSR with TWI in III. F/u EKGs here in the ER are nonacute, normal TWI III. Father died at 24 of "enlarged heart" (unclear if +CAD), mother died in her 52s with many years of blockages, unclear which age she was diagnosed.  Hospital Course  She was admitted for observation. Hx blocker added to regimen and continued home Prilosec. No overnight chest pain. Top x 3 negative. Tele without acute abnormality. Patient underwent POET (Plain Old Exercise Treadmill), which showed no ischemia. She had frequent PVCs. She felt stable for discharge.   Lipid panel showed - Cholesterol 233*; HDL 33*; LDL Cholesterol 122*; Triglycerides 388*; VLDL 78*. Patient has hx of pancreatitis. Lipase and LFT were normal.  Per patient, pravastatin increased her LFTs. Consider trial of a different medicine with close monitoring if LFTs OK as outpatient. Patient was referred for outpatient Nutritional consult. She was given "High Triglycerides Nutrition Therapy" handout from the [employer]. She requested internal GI referral to [facility]. Message sent to outpatient staff. Cardiology office will contact patient with appointment with  GI.   She was also scheduled for a follow up echo for her bicuspid valve as outpatient.   Right carotid bruit - may be radiation from valve but is worthwhile to pursue outpatient dopplers.  Discharge Vitals Blood pressure 126/58, pulse 56, temperature 98.1 F (36.7 C), temperature source Oral, resp. rate 18, height  (1.626 m), weight 166 lb 8 oz (75.524 kg), SpO2 93 %.  Filed Weights   10/17/14 1907  Weight: 166 lb 8 oz (75.524 kg)    CBC  Recent Labs  10/17/14 1506 10/18/14 0615  WBC 7.8 6.6  HGB 12.3 11.9*  HCT 36.2 36.0  MCV 91.4 92.8  PLT 241 215   Basic Metabolic Panel  Recent Labs  10/17/14 1506 10/18/14 0615  NA 138 139  K 4.0 3.9  CL 105  106  CO2 26 27  GLUCOSE 92 112*  BUN 14 12  CREATININE 0.56 0.55  CALCIUM 9.0 8.5*   Liver Function Tests  Recent Labs  10/17/14 2053  AST 23  ALT 28  ALKPHOS 53  BILITOT 0.5  PROT 6.4*  ALBUMIN 3.6    Recent Labs  10/17/14 1500  LIPASE 16*   Cardiac Enzymes  Recent Labs  10/17/14 2053 10/18/14 0008 10/18/14 0615  TROPONINI <0.03 <0.03 <0.03   Fasting Lipid Panel  Recent Labs  10/18/14 0615  CHOL 233*  HDL 33*  LDLCALC 122*  TRIG 388*  CHOLHDL 7.1   Thyroid Function Tests  Recent Labs  10/17/14 2043  TSH 2.295    Disposition  Pt is being discharged home today in good condition.  Follow-up Plans & Appointments  Follow-up Information    Follow up with Theodore Demark, PA-C On 11/07/2014.   Specialties:  Cardiology, Radiology   Why:  10:00 echo and 11:00 cardiolgoy f/u   Contact information:   [address] [place] [address] [phone]           Discharge Instructions    Diet - low sodium heart healthy    Complete by:  As directed      Increase activity slowly    Complete by:  As directed            F/u Labs/Studies: none   Discharge Medications    Medication List    TAKE these medications        bisoprolol-hydrochlorothiazide 10-6.25 MG per tablet  Commonly known as:  ZIAC  Take 1 tablet by mouth daily.     famotidine 40 MG tablet  Commonly known as:  PEPCID  Take 1 tablet (40 mg total) by mouth daily.     HYDROcodone-acetaminophen 5-325 MG per tablet  Commonly known as:  NORCO/VICODIN  Take 1 tablet by mouth every 6 (six) hours as needed for moderate pain.     ibuprofen 200 MG tablet  Commonly known as:  ADVIL,MOTRIN  Take 200 mg by mouth every 6 (six) hours as needed for mild pain or moderate pain.     omeprazole 20 MG capsule  Commonly known as:  PRILOSEC  Take 20 mg by mouth daily.     promethazine 25 MG tablet  Commonly known as:  PHENERGAN  Take 25 mg by mouth every 6 (six) hours as  needed for nausea or vomiting.        Duration of Discharge Encounter   Greater than 30 minutes including physician time.  Signed, Ronon Ferger PA-C 10/18/2014, 1:03 PM

## 2014-10-18 NOTE — Discharge Instructions (Signed)
Your f/u echo and cardiology appointment at [address] office. Office will call with GI and nutritional referral.

## 2014-10-18 NOTE — Progress Notes (Signed)
Patient Name: Mary Wade Date of Encounter: 10/18/2014  Active Problems:   Bicuspid aortic valve   Hyperlipidemia   Tobacco abuse   Essential hypertension   Right carotid bruit   Chest pain  SUBJECTIVE  Denies chest pain or sob. Triglyceride of 388 - address dietary importance.   CURRENT MEDS . aspirin EC  81 mg Oral Daily  . bisoprolol-hydrochlorothiazide  1 tablet Oral Daily  . famotidine  40 mg Oral Daily  . [START ON 10/19/2014] Influenza vac split quadrivalent PF  0.5 mL Intramuscular Tomorrow-1000  . pantoprazole  40 mg Oral BID  . [START ON 10/19/2014] pneumococcal 23 valent vaccine  0.5 mL Intramuscular Tomorrow-1000  . sodium chloride  3 mL Intravenous Q12H    OBJECTIVE  Filed Vitals:   10/17/14 1700 10/17/14 1800 10/17/14 1907 10/18/14 0500  BP: 144/62 140/75 155/65 126/58  Pulse: 72 69 58 56  Temp:   98.1 F (36.7 C) 98.1 F (36.7 C)  TempSrc:   Oral Oral  Resp: Height:    (1.626 m)   Weight:   166 lb 8 oz (75.524 kg)   SpO2: 96% 96% 98% 93%    Intake/Output Summary (Last 24 hours) at 10/18/14 0828 Last data filed at 10/18/14 0818  Gross per 24 hour  Intake      0 ml  Output      0 ml  Net      0 ml   Filed Weights   10/17/14 1907  Weight: 166 lb 8 oz (75.524 kg)    PHYSICAL EXAM  General: Pleasant, NAD. Neuro: Alert and oriented X 3. Moves all extremities spontaneously. Psych: Normal affect. HEENT:  Normal  Neck: Supple. R bruit. No JVD. Lungs:  Resp regular and unlabored, CTA. Heart: RRR no s3, s4, or murmurs. Abdomen: Soft, non-tender, non-distended, BS + x 4.  Extremities: No clubbing, cyanosis or edema. DP/PT/Radials 2+ and equal bilaterally.  Accessory Clinical Findings  CBC  Recent Labs  10/17/14 1506 10/18/14 0615  WBC 7.8 6.6  HGB 12.3 11.9*  HCT 36.2 36.0  MCV 91.4 92.8  PLT 241 215   Basic Metabolic Panel  Recent Labs  10/17/14 1506 10/18/14 0615  NA 138 139  K 4.0 3.9  CL 105 106  CO2 26  27  GLUCOSE 92 112*  BUN 14 12  CREATININE 0.56 0.55  CALCIUM 9.0 8.5*   Liver Function Tests  Recent Labs  10/17/14 2053  AST 23  ALT 28  ALKPHOS 53  BILITOT 0.5  PROT 6.4*  ALBUMIN 3.6    Recent Labs  10/17/14 1500  LIPASE 16*   Cardiac Enzymes  Recent Labs  10/17/14 2053 10/18/14 0008 10/18/14 0615  TROPONINI <0.03 <0.03 <0.03     Recent Labs  10/18/14 0615  CHOL 233*  HDL 33*  LDLCALC 122*  TRIG 388*  CHOLHDL 7.1   Thyroid Function Tests  Recent Labs  10/17/14 2043  TSH 2.295    TELE  Sinus rhythm with rate of 50-60s.   Radiology/Studies  Dg Chest 2 View  10/17/2014   CLINICAL DATA:  Left chest pain beneath the breast.  Hypertension.  EXAM: CHEST  2 VIEW  COMPARISON:  01/28/2014  FINDINGS: The heart size and mediastinal contours are within normal limits. Both lungs are clear. The visualized skeletal structures are unremarkable.  IMPRESSION: No active cardiopulmonary disease.   Electronically Signed   By: Gaylyn Rong M.D.   On:  10/17/2014 14:47    ASSESSMENT AND PLAN   1. Chest pain - typical and atypical features. She feels this is similar to her GERD symptoms but with other qualities that she has not had before such as SOB, nausea, and clamminess.  - Top x 3 negative. Tele without acute abnormality.  - Will get POET today--> Patient tolerated test well. Few PVCs during exercise test. During recovery frequent PVCs with ven trigemeny. She did feel PVCs described as "shaking heart". See cupid for full report. . - continue ASA, pepcid and protonix.   2. H/o bicuspid aortic valve with mod AI - CXR normal, pulses equal, not hypertensive or hypotensive. Echo as outpatient.  3. Essential HTN, generally controlled in ER - continue current regimen.  4. Hyperlipidemia - -10/18/2014: Cholesterol 233*; HDL 33*; LDL Cholesterol 122*; Triglycerides 388*; VLDL 78*  - hx of pancreatitis. Lipase normal. LFT normal.  - Will get nutrition consult.  -  Per patient, pravastatin increased her LFTs. Could try a trial of a different medicine with close monitoring.  5. Ongoing tobacco abuse  - counseled regarding cessation.   6. Right carotid bruit - may be radiation from valve but is worthwhile to pursue outpatient dopplers.  Signed, Bhagat,Bhavinkumar PA-C   History and all data above reviewed.  Patient examined.  I agree with the findings as above.  No further chest painThe patient exam reveals COR:RRR  ,  Lungs: Clear  ,  Abd: Positive bowel sounds, no rebound no guarding, Ext No edema  .  All available labs, radiology testing, previous records reviewed. Agree with documented assessment and plan. Chest pain:  I reviewed the stress test.  No ischemia.  She had PVCs.  She will follow with Dr. Jens Som and we will schedule a follow up echo for her bicuspid valve.  She wants to see a dietician and GI for her reflux.  We can arrange this.    Fayrene Fearing Pearla Mckinny  10:18 AM  10/18/2014

## 2014-10-18 NOTE — Progress Notes (Signed)
Pt discharged home, Pt understands all information. IV removed

## 2014-10-18 NOTE — Progress Notes (Signed)
Nutrition Education Note  RD consulted for nutrition education regarding a Heart Healthy diet for high triglycerides and high cholesterol.   Lipid Panel     Component Value Date/Time   CHOL 233* 10/18/2014 0615   TRIG 388* 10/18/2014 0615   HDL 33* 10/18/2014 0615   CHOLHDL 7.1 10/18/2014 0615   VLDL 78* 10/18/2014 0615   LDLCALC 122* 10/18/2014 0615    Body mass index is 28.57 kg/(m^2). Pt meets criteria for overweight based on current BMI.  Patient being discharged at this time. RN stated that she is being referred for OP nutrition education. RD provided RN with "High Triglycerides Nutrition Therapy" handout from the Academy of [employer]etics to give to patient. RD contact information on handout for any questions from patient.    Joaquin Courts, RD, LDN, CNSC Pager [phone] After Hours Pager [phone]

## 2014-10-22 ENCOUNTER — Telehealth: Payer: Self-pay | Admitting: Cardiology

## 2014-10-22 NOTE — Telephone Encounter (Signed)
Appt on 11/07/14 at 11am w/ Theodore Demark

## 2014-10-22 NOTE — Telephone Encounter (Signed)
LMTCB

## 2014-10-24 NOTE — Telephone Encounter (Signed)
LMTCB

## 2014-10-24 NOTE — Telephone Encounter (Signed)
[facility].  Patient understands to follow up with provider R. Barrett, PA on 11/07/2014 at 11am (echo at 10am) at [facility]. Patient understands discharge instructions? YES Patient understands medications and regiment? YES - asked about new Rx - appears to be pepcid Patient understands to bring all medications to this visit? YES

## 2014-10-24 NOTE — Telephone Encounter (Signed)
Pt is returning Jenna's call . She asked that you call her back on her work number and to tell the operator that her cardiologist is returning a call and to page her .   Thanks

## 2014-11-07 ENCOUNTER — Encounter: Payer: Self-pay | Admitting: Physician Assistant

## 2014-11-07 ENCOUNTER — Ambulatory Visit (HOSPITAL_COMMUNITY): Payer: BLUE CROSS/BLUE SHIELD | Attending: Cardiology

## 2014-11-07 ENCOUNTER — Ambulatory Visit (INDEPENDENT_AMBULATORY_CARE_PROVIDER_SITE_OTHER): Payer: BLUE CROSS/BLUE SHIELD | Admitting: Physician Assistant

## 2014-11-07 ENCOUNTER — Other Ambulatory Visit: Payer: Self-pay

## 2014-11-07 VITALS — BP 124/62 | HR 65 | Ht 64.0 in | Wt 163.0 lb

## 2014-11-07 DIAGNOSIS — Z8249 Family history of ischemic heart disease and other diseases of the circulatory system: Secondary | ICD-10-CM | POA: Insufficient documentation

## 2014-11-07 DIAGNOSIS — R002 Palpitations: Secondary | ICD-10-CM

## 2014-11-07 DIAGNOSIS — I358 Other nonrheumatic aortic valve disorders: Secondary | ICD-10-CM | POA: Diagnosis present

## 2014-11-07 DIAGNOSIS — E785 Hyperlipidemia, unspecified: Secondary | ICD-10-CM

## 2014-11-07 DIAGNOSIS — Q231 Congenital insufficiency of aortic valve: Secondary | ICD-10-CM

## 2014-11-07 DIAGNOSIS — I10 Essential (primary) hypertension: Secondary | ICD-10-CM | POA: Diagnosis not present

## 2014-11-07 DIAGNOSIS — F17200 Nicotine dependence, unspecified, uncomplicated: Secondary | ICD-10-CM | POA: Diagnosis not present

## 2014-11-07 MED ORDER — FISH OIL 1000 MG PO CAPS: 1000.0000 mg | ORAL_CAPSULE | Freq: Two times a day (BID) | ORAL | Status: DC

## 2014-11-07 MED ORDER — OMEPRAZOLE 20 MG PO CPDR: 40.0000 mg | DELAYED_RELEASE_CAPSULE | Freq: Every day | ORAL | Status: DC

## 2014-11-07 NOTE — Progress Notes (Signed)
Cardiology Office Note   Date:  11/07/2014   ID:  Mary Wade, DOB 04/20/1960, MRN [id number]  PCP:  Rockne Coons, DO  Cardiologist:  Dr. Ellin Saba, [place], [facility] follow-up  History of Present Illness: Mary Wade is a 54 y.o. female with a history of ongoing tobacco abuse, HTN, HLD (untreated), GERD, bicuspid aortic valve with moderate AI, PVCs by event monitor in 10/2013, pancreatitis, occasional alcohol.   Admitted 09/01>10/18/2014 w/ chest pain. Had non-ischemic POET and d/c home to continue PPI.  Mary Wade presents for post-hospital follow-up.  Since discharge from the hospital, she has not had any additional episodes of chest pain. 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   She does not exercise. She is continuing to smoke and states that she quit once before and did well. However, she gained over 20 pounds and struggled with health issues at the higher weight. She does not wish to do that again.  She has episodes of getting lightheaded 2 or 3 times a week. These may be associated with exertion. She is not sure. She will get lightheaded and seeing white spots. She will stop what she is doing and sit down to rest if the symptoms do not resolve very quickly. She has never fallen or felt like she was in danger of losing consciousness.  She also has episodes where she gets lightheaded when she bends over but feels that she can control these by squatting down and being careful not to bend over and drop her head down.  I note, she has started taking fish oil 2 g twice a day as previously recommended by Alfonse Ras in a note from 11/02/2013 pharmacy visit. She has noticed decreased burning and tingling in her feet after being on fish oil and requests that we prescribed this.  Also reports that her reflux symptoms were terrible when she first left the hospital, but these have improved.  However they're still significant and she wonders what to do about that also.  Past Medical History  Diagnosis Date  . Hypertension   . Bicuspid aortic valve     a. 10/24/13 showed EF 55-60%, no RWMA, bicuspid AV with mod AI, mildly dilated ascending aorta, PASP .  Marland Kitchen Pancreatitis     2 previous episodes, last one 2013  . Hyperlipidemia   . Aortic insufficiency     a. 10/2013: mod AI by echo.  Marland Kitchen GERD (gastroesophageal reflux disease)   . PVC's (premature ventricular contractions)     a. 10/2013 event monitor: NSR and PVCs.  . Tobacco abuse     Past Surgical History  Procedure Laterality Date  . Appendectomy    . Abdominal hysterectomy    . Cholecystectomy      Current Outpatient Prescriptions  Medication Sig Dispense Refill  . bisoprolol-hydrochlorothiazide (ZIAC) 10-6.25 MG per tablet Take 1 tablet by mouth daily. 90 tablet 1  . HYDROcodone-acetaminophen (NORCO/VICODIN) 5-325 MG per tablet Take 1 tablet by mouth every 6 (six) hours as needed for moderate pain.    Marland Kitchen ibuprofen (ADVIL,MOTRIN) 200 MG tablet Take 200 mg by mouth every 6 (six) hours as needed for mild pain or moderate pain.    . promethazine (PHENERGAN) 25 MG tablet Take 25 mg by mouth every 6 (six) hours as needed for nausea or vomiting.    . Omega-3 Fatty Acids (FISH  OIL) 1000 MG CAPS Take 1 capsule (1,000 mg total) by mouth 2 (two) times daily. 360 capsule 3  . omeprazole (PRILOSEC) 20 MG capsule Take 2 capsules (40 mg total) by mouth daily. 30 capsule 11   No current facility-administered medications for this visit.    Allergies:   Niacin and related and Pravastatin    Social History:  The patient  reports that she has been smoking Cigarettes.  She has a 40 pack-year smoking history. She has never used smokeless tobacco. She reports that she drinks alcohol. She reports that she does not use illicit drugs.   Family History:  The patient's family history includes Aneurysm in her mother; CAD in her mother;  Heart disease in her father.   ROS:  Please see the history of present illness. All other systems are reviewed and negative.   PHYSICAL EXAM: VS:  BP 124/62 mmHg  Pulse 65  Ht  (1.626 m)  Wt 163 lb (73.936 kg)  BMI 27.97 kg/m2 , BMI Body mass index is 27.97 kg/(m^2). GEN: Well nourished, well developed, in no acute distress HEENT: normal Neck: no JVD, carotid bruits, or masses Cardiac: RRR; 2/6 murmur, loud S2, short DM , no rubs, or gallops,no edema  Respiratory:  clear to auscultation bilaterally, normal work of breathing GI: soft, nontender, nondistended, + BS MS: no deformity or atrophy Skin: warm and dry, no rash Neuro:  Strength and sensation are intact Psych: euthymic mood, full affect   EKG:  EKG is ordered today. The ekg ordered today demonstrates sinus rhythm, no acute ischemic changes, no significant abnormalities  Recent Labs: 10/17/2014: ALT 28; TSH 2.295 10/18/2014: BUN 12; Creatinine, Ser 0.55; Hemoglobin 11.9*; Platelets 215; Potassium 3.9; Sodium 139    Lipid Panel    Component Value Date/Time   CHOL 233* 10/18/2014 0615   TRIG 388* 10/18/2014 0615   HDL 33* 10/18/2014 0615   CHOLHDL 7.1 10/18/2014 0615   VLDL 78* 10/18/2014 0615   LDLCALC 122* 10/18/2014 0615   LDLDIRECT 140.2 01/31/2014 0743     Wt Readings from Last 3 Encounters:  11/07/14 163 lb (73.936 kg)  10/17/14 166 lb 8 oz (75.524 kg)  10/17/14 160 lb (72.576 kg)     Other studies Reviewed: Additional studies/ records that were reviewed today include: Previous office notes, hospital records.  ASSESSMENT AND PLAN:  1.  Chest pain: She has had no recurrent episodes since discharge from the hospital and her symptoms are quite possibly GI in origin.  To that end, we will increase her Prilosec to 20 mg, 2 tablets every morning. Discussed with her the old wive's tale of using vinegar to control reflux and she will try that in the evenings.  2. Bicuspid aortic valve: She had an  echocardiogram and she will be notified of the results. She was encouraged to increase activity slowly and keep a symptom diary of the presyncopal spells. If they're lasting longer and becoming more frequent, she is to let us know.  She is asked to record the date and time, proceeding activity and duration of the episodes.  3. Hypertriglyceridemia: Her triglycerides have improved and she is tolerating fish oil well. We will continue this.  Recheck liver tests and lipid profile in 3 months.  Current medicines are reviewed at length with the patient today.  The patient does not have concerns regarding medicines.  The following changes have been made:  Increase omeprazole, add Fish oil  Labs/ tests ordered today include:  Orders Placed This Encounter  Procedures  . Lipid Profile  . Hepatic function panel  . EKG 12-Lead     Disposition:   FU with Dr. Jens Som in 3 months.  Tawny Asal  11/07/2014 12:46 PM    [facility] [address] [place], [place], [place]  [address] Phone: [phone]; Fax: [phone]

## 2014-11-07 NOTE — Patient Instructions (Addendum)
Medication Instructions:  Your physician has recommended you make the following change in your medication:  1.  INCREASE YOUR PRILOSEC TO 2 TABLETS IN THE MORNING.  IF YOU NEED TO TAKE ONE AT NIGHT, THAT IS FINE, OR YOU CAN TRY VINEGAR. 2.  FISH OIL 1000 MG, TAKE 2 TABLETS TWICE A DAY    Labwork: IN 3 MONTHS @ YOUR APPOINTMENT WITH DR. CRENSHAW:  LIPID & LFT  Testing/Procedures: None ordered  Follow-Up: Your physician recommends that you schedule a follow-up appointment in: 3 MONTHS WITH DR Jens Som   Any Other Special Instructions Will Be Listed Below (If Applicable).

## 2014-11-18 ENCOUNTER — Telehealth: Payer: Self-pay | Admitting: Family Medicine

## 2014-11-18 DIAGNOSIS — Z1231 Encounter for screening mammogram for malignant neoplasm of breast: Secondary | ICD-10-CM

## 2014-11-18 NOTE — Telephone Encounter (Signed)
Spoke with patient about her mammogram.  She is going to call and set up one with [facility].  She will have them fax [place] a copy of the report when it is finalized.  I put the order in for the mammogram.

## 2014-11-25 ENCOUNTER — Other Ambulatory Visit: Payer: Self-pay | Admitting: Family Medicine

## 2014-12-06 ENCOUNTER — Encounter: Payer: BC Managed Care – PPO | Admitting: Family Medicine

## 2014-12-09 ENCOUNTER — Other Ambulatory Visit: Payer: Self-pay | Admitting: Family Medicine

## 2014-12-13 ENCOUNTER — Encounter: Payer: Self-pay | Admitting: Gastroenterology

## 2014-12-13 ENCOUNTER — Ambulatory Visit (INDEPENDENT_AMBULATORY_CARE_PROVIDER_SITE_OTHER): Payer: BLUE CROSS/BLUE SHIELD | Admitting: Gastroenterology

## 2014-12-13 VITALS — BP 126/58 | HR 92 | Ht 63.75 in | Wt 167.1 lb

## 2014-12-13 DIAGNOSIS — R159 Full incontinence of feces: Secondary | ICD-10-CM | POA: Diagnosis not present

## 2014-12-13 DIAGNOSIS — R197 Diarrhea, unspecified: Secondary | ICD-10-CM

## 2014-12-13 DIAGNOSIS — Z1211 Encounter for screening for malignant neoplasm of colon: Secondary | ICD-10-CM | POA: Diagnosis not present

## 2014-12-13 DIAGNOSIS — K219 Gastro-esophageal reflux disease without esophagitis: Secondary | ICD-10-CM

## 2014-12-13 MED ORDER — NA SULFATE-K SULFATE-MG SULF 17.5-3.13-1.6 GM/177ML PO SOLN: ORAL | Status: DC

## 2014-12-13 NOTE — Progress Notes (Signed)
HPI :  54 y/o female seen in consultation for chest pain, new patient to our clinic.   Patients reports she has a history of GERD. She reports in August she had a severe episode of substernal chest pain. She was admitted overnight to the hospital, she reports she had a cardiac workup which was negative for ischemia causing this. She had a negative stress test. She was told her symptoms were probably due to GERD. Her typical symptom of GERD is chest discomfort in her lower chest, also she also has some regurgitation as well. She reports normally symptoms are fairly well controlled on omeprazole 20mg , although when she has symptoms she can have severe pain in her chest that is debilitatiing despite taking omeprazole. It does not happen frequently when she has this severe pain. She has increased her prilosec from 20mg  to 40mg  daily since her hospitalization. She reports since doing this her symptoms are improved and no further severe pains - she has symptoms that are mild at baseline about 3 days per week. No pyrosis. No dysphagia. No odynophagia. No vomiting. Eating okay for the most part. No unexpected weight loss.   No blood in the stools. She otherwise states for the past 10 years she has had significant urgency of her stools however and loose stools. She reports from strong urgency has had fecal incontinence from severe episodes. She has had this a few times per month. She has roughly 3-5 BMs per day, usually always loose. She has occasional nocturnal symptoms. No significant lower abdominal discomfort., some upper discomfort. No prior colonoscopy. She thinks she has had a prior upper endoscopy, around the time of her gallbladder being removed and when she had gallstone pancreatitis. She thinks her EGD was in the 1980s but no report available. She takes periodic NSAIDs but not routinely.   Of note, the patient's daughter has UC, diagnosed at age 53. No FH of CRC.    Past Medical History  Diagnosis  Date  . Hypertension   . Bicuspid aortic valve     a. 10/24/13 showed EF 55-60%, no RWMA, bicuspid AV with mod AI, mildly dilated ascending aorta, PASP 32mmHg.  Marland Kitchen. Pancreatitis     2 previous episodes, last one 2013  . Hyperlipidemia   . Aortic insufficiency     a. 10/2013: mod AI by echo.  Marland Kitchen. GERD (gastroesophageal reflux disease)   . PVC's (premature ventricular contractions)     a. 10/2013 event monitor: NSR and PVCs.  . Tobacco abuse   . Fibromyalgia   . Gallstones   . Chronic diarrhea      Past Surgical History  Procedure Laterality Date  . Appendectomy    . Abdominal hysterectomy    . Cholecystectomy     Family History  Problem Relation Age of Onset  . CAD Mother     Died at 1573, several blockages but patient unknown when this started  . Heart disease Father     Died of MI at age 53 - enlarged heart  . Aneurysm Mother     Brain aneurysm - died of this during surgery  . Esophageal cancer Cousin   . Ulcerative colitis Daughter   . Diabetes Son   . Diabetes Paternal Grandmother    Social History  Substance Use Topics  . Smoking status: Current Every Day Smoker -- 1.00 packs/day for 40 years    Types: Cigarettes  . Smokeless tobacco: Never Used  . Alcohol Use: 0.0 oz/week  0 Standard drinks or equivalent per week     Comment: twice per week - 2-6 beers on each occasion   Current Outpatient Prescriptions  Medication Sig Dispense Refill  . bisoprolol-hydrochlorothiazide (ZIAC) 10-6.25 MG tablet TAKE 1 TABLET BY MOUTH DAILY.  'NO MORE REFILLS WITHOUT OFFICE VISIT" 90 tablet 0  . HYDROcodone-acetaminophen (NORCO/VICODIN) 5-325 MG per tablet Take 1 tablet by mouth every 6 (six) hours as needed for moderate pain.    Marland Kitchen ibuprofen (ADVIL,MOTRIN) 200 MG tablet Take 200 mg by mouth every 6 (six) hours as needed for mild pain or moderate pain.    Marland Kitchen omega-3 acid ethyl esters (LOVAZA) 1 G capsule Take 2 capsules by mouth daily.    . Omega-3 Fatty Acids (FISH OIL) 1000 MG CAPS Take  1 capsule (1,000 mg total) by mouth 2 (two) times daily. 360 capsule 3  . omeprazole (PRILOSEC) 20 MG capsule Take 2 capsules (40 mg total) by mouth daily. 30 capsule 11  . promethazine (PHENERGAN) 25 MG tablet Take 25 mg by mouth every 6 (six) hours as needed for nausea or vomiting.    . Na Sulfate-K Sulfate-Mg Sulf SOLN Take as directed per Colonoscopy. 354 mL 0   No current facility-administered medications for this visit.   Allergies  Allergen Reactions  . Niacin And Related     flush  . Pravastatin     Muscle aches, elevated LFTs     Review of Systems: All systems reviewed and negative except where noted in HPI.   Lab Results  Component Value Date   WBC 6.6 10/18/2014   HGB 11.9* 10/18/2014   HCT 36.0 10/18/2014   MCV 92.8 10/18/2014   PLT 215 10/18/2014    Lab Results  Component Value Date   ALT 28 10/17/2014   AST 23 10/17/2014   ALKPHOS 53 10/17/2014   BILITOT 0.5 10/17/2014   Lab Results  Component Value Date   CREATININE 0.55 10/18/2014   BUN 12 10/18/2014   NA 139 10/18/2014   K 3.9 10/18/2014   CL 106 10/18/2014   CO2 27 10/18/2014     Physical Exam: BP 126/58 mmHg  Pulse 92  Ht 5' 3.75" (1.619 m)  Wt 167 lb 2 oz (75.807 kg)  BMI 28.92 kg/m2 Constitutional: Pleasant,well-developed, female in no acute distress. HEENT: Normocephalic and atraumatic. Conjunctivae are normal. No scleral icterus. Neck supple.  Cardiovascular: Normal rate, regular rhythm.  Pulmonary/chest: Effort normal and breath sounds normal. No wheezing, rales or rhonchi. Abdominal: Soft, nondistended, nontender. Bowel sounds active throughout. There are no masses palpable. No hepatomegaly. Extremities: no edema Lymphadenopathy: No cervical adenopathy noted. Neurological: Alert and oriented to person place and time. Skin: Skin is warm and dry. No rashes noted. Psychiatric: Normal mood and affect. Behavior is normal.   ASSESSMENT AND PLAN: 54 y/o female with longstanding  tobacco use, history of GERD, and FH of ulcerative colitis, presenting for a few symptoms as outlined below.   GERD / chest pain - patient with baseline GERD which has been fairly well controlled on PPI over the years but multiple episodes of severe chest pain over time, the last leading to admission where she ruled out for cardiac ischemia. Symptoms could be related to GERD vs. Esophageal spasm if non-cardiac. Agree with higher dose PPI which appears to be working well for her at present time, she should continue omeprazole  daily. I otherwise offered her an upper endoscopy to rule out esophagitis, large hiatal hernia, EoE, etc, and perform Barrett's  screening given tobacco use history and longstanding reflux. She agreed to proceed.   Diarrhea / fecal incontinence - longstanding symptoms without prior evaluation. No anemia. Daughter with UC. She is due for colon cancer screening, and to evaluate these symptoms recommend a colonoscopy to rule out IBD. She agreed and wished to proceed following discussion below. Recommend immodium PRN to prevent incontinence if she is outside her home or traveling, and can try daily fiber supplement to help bulk stools if that will help as well in the interim.   The indications, risks, and benefits of EGD and colonoscopy were explained to the patient in detail. Risks include but are not limited to bleeding, perforation, adverse reaction to medications, and cardiopulmonary compromise. Sequelae include but are not limited to the possibility of surgery, hositalization, and mortality. The patient verbalized understanding and wished to proceed. All questions answered, referred to scheduler and bowel prep ordered. Further recommendations pending results of the exam.   Ileene Patrick, MD [facility] Pager [phone]

## 2014-12-13 NOTE — Patient Instructions (Addendum)
You have been given a separate informational sheet regarding your tobacco use, the importance of quitting and local resources to help you quit.  You have been scheduled for an endoscopy and colonoscopy. Please follow the written instructions given to you at your visit today. Please pick up your prep supplies at the pharmacy within the next 1-3 days. If you use inhalers (even only as needed), please bring them with you on the day of your procedure. Your physician has requested that you go to www.startemmi.com and enter the access code given to you at your visit today. This web site gives a general overview about your procedure. However, you should still follow specific instructions given to you by our office regarding your preparation for the procedure.  Please start taking Imodium over the counter as needed.   Start taking an Over the counter fiber supplement daily.

## 2014-12-16 ENCOUNTER — Telehealth: Payer: Self-pay

## 2014-12-16 NOTE — Telephone Encounter (Signed)
Pt called back and stated she will come pick up prep this week or next week.

## 2014-12-16 NOTE — Telephone Encounter (Signed)
Called pt and left vm for pt to call back about prep at pharmacy.

## 2015-01-03 ENCOUNTER — Telehealth: Payer: Self-pay

## 2015-01-03 NOTE — Telephone Encounter (Signed)
Called and rescheduled pt's appointment for 01/27/2015. I also informed pt that her prep was still at the front and that she could pick it up at anytime.

## 2015-01-14 NOTE — Progress Notes (Signed)
HPI: FU history of bicuspid aortic valve and palpitations. Nuclear study 2003 normal with ejection 60%. ABIs June 2015 normal. Monitor in September 2015 showed sinus rhythm with PVCs. MRA of the chest December 2015 showed no thoracic aortic aneurysm. MRA of the head December 2015 showed a 1-2 mm lesion consistent with ectasia versus tiny aneurysm. Follow-up recommended. Echocardiogram September 2016 showed normal LV function, bicuspid aortic valve, mild aortic insufficiency. Exercise treadmill September 2016 was normal.  Current Outpatient Prescriptions  Medication Sig Dispense Refill  . bisoprolol-hydrochlorothiazide (ZIAC) 10-6.25 MG tablet TAKE 1 TABLET BY MOUTH DAILY.  'NO MORE REFILLS WITHOUT OFFICE VISIT" 90 tablet 0  . HYDROcodone-acetaminophen (NORCO/VICODIN) 5-325 MG per tablet Take 1 tablet by mouth every 6 (six) hours as needed for moderate pain.    Marland Kitchen ibuprofen (ADVIL,MOTRIN) 200 MG tablet Take 200 mg by mouth every 6 (six) hours as needed for mild pain or moderate pain.    . Na Sulfate-K Sulfate-Mg Sulf SOLN Take as directed per Colonoscopy. 354 mL 0  . omega-3 acid ethyl esters (LOVAZA) 1 G capsule Take 2 capsules by mouth daily.    . Omega-3 Fatty Acids (FISH OIL) 1000 MG CAPS Take 1 capsule (1,000 mg total) by mouth 2 (two) times daily. 360 capsule 3  . omeprazole (PRILOSEC) 20 MG capsule Take 2 capsules (40 mg total) by mouth daily. 30 capsule 11  . promethazine (PHENERGAN) 25 MG tablet Take 25 mg by mouth every 6 (six) hours as needed for nausea or vomiting.     No current facility-administered medications for this visit.     Past Medical History  Diagnosis Date  . Hypertension   . Bicuspid aortic valve     a. 10/24/13 showed EF 55-60%, no RWMA, bicuspid AV with mod AI, mildly dilated ascending aorta, PASP .  Marland Kitchen Pancreatitis     2 previous episodes, last one 2013  . Hyperlipidemia   . Aortic insufficiency     a. 10/2013: mod AI by echo.  Marland Kitchen GERD  (gastroesophageal reflux disease)   . PVC's (premature ventricular contractions)     a. 10/2013 event monitor: NSR and PVCs.  . Tobacco abuse   . Fibromyalgia   . Gallstones   . Chronic diarrhea     Past Surgical History  Procedure Laterality Date  . Appendectomy    . Abdominal hysterectomy    . Cholecystectomy      Social History   Social History  . Marital Status: Single    Spouse Name: N/A  . Number of Children: 2  . Years of Education: N/A   Occupational History  .      Dental office   Social History Main Topics  . Smoking status: Current Every Day Smoker -- 1.00 packs/day for 40 years    Types: Cigarettes  . Smokeless tobacco: Never Used  . Alcohol Use: 0.0 oz/week    0 Standard drinks or equivalent per week     Comment: twice per week - 2-6 beers on each occasion  . Drug Use: No  . Sexual Activity: Yes   Other Topics Concern  . Not on file   Social History Narrative    ROS: no fevers or chills, productive cough, hemoptysis, dysphasia, odynophagia, melena, hematochezia, dysuria, hematuria, rash, seizure activity, orthopnea, PND, pedal edema, claudication. Remaining systems are negative.  Physical Exam: Well-developed well-nourished in no acute distress.  Skin is warm and dry.  HEENT is normal.  Neck is supple.  Chest  is clear to auscultation with normal expansion.  Cardiovascular exam is regular rate and rhythm.  Abdominal exam nontender or distended. No masses palpated. Extremities show no edema. neuro grossly intact  ECG     This encounter was created in error - please disregard.

## 2015-01-15 ENCOUNTER — Encounter: Payer: Self-pay | Admitting: Cardiology

## 2015-01-15 NOTE — Progress Notes (Signed)
HPI: FU history of bicuspid aortic valve and palpitations. Nuclear study 2003 normal with ejection 60%. ABIs June 2015 normal. Monitor in September 2015 showed sinus rhythm with PVCs. MRA of the chest December 2015 showed no thoracic aortic aneurysm. MRA of the head December 2015 showed a 1-2 mm lesion consistent with ectasia versus tiny aneurysm. Follow-up recommended. Echocardiogram September 2016 showed normal LV function, bicuspid aortic valve, mild aortic insufficiency. Exercise treadmill September 2016 was normal. Since last seen, the patient denies any dyspnea on exertion, orthopnea, PND, pedal edema, syncope or chest pain. Occasional brief palpitations.   Current Outpatient Prescriptions  Medication Sig Dispense Refill  . bisoprolol-hydrochlorothiazide (ZIAC) 10-6.25 MG tablet TAKE 1 TABLET BY MOUTH DAILY.  'NO MORE REFILLS WITHOUT OFFICE VISIT" 90 tablet 0  . HYDROcodone-acetaminophen (NORCO/VICODIN) 5-325 MG per tablet Take 1 tablet by mouth every 6 (six) hours as needed for moderate pain.    Marland Kitchen ibuprofen (ADVIL,MOTRIN) 200 MG tablet Take 200 mg by mouth every 6 (six) hours as needed for mild pain or moderate pain.    . Na Sulfate-K Sulfate-Mg Sulf SOLN Take as directed per Colonoscopy. 354 mL 0  . omega-3 acid ethyl esters (LOVAZA) 1 G capsule Take 2 capsules by mouth daily.    Marland Kitchen omeprazole (PRILOSEC) 20 MG capsule Take 2 capsules (40 mg total) by mouth daily. 30 capsule 11  . promethazine (PHENERGAN) 25 MG tablet Take 25 mg by mouth every 6 (six) hours as needed for nausea or vomiting.     No current facility-administered medications for this visit.     Past Medical History  Diagnosis Date  . Hypertension   . Bicuspid aortic valve     a. 10/24/13 showed EF 55-60%, no RWMA, bicuspid AV with mod AI, mildly dilated ascending aorta, PASP .  Marland Kitchen Pancreatitis     2 previous episodes, last one 2013  . Hyperlipidemia   . Aortic insufficiency     a. 10/2013: mod AI by echo.  Marland Kitchen  GERD (gastroesophageal reflux disease)   . PVC's (premature ventricular contractions)     a. 10/2013 event monitor: NSR and PVCs.  . Tobacco abuse   . Fibromyalgia   . Gallstones   . Chronic diarrhea     Past Surgical History  Procedure Laterality Date  . Appendectomy    . Abdominal hysterectomy    . Cholecystectomy      Social History   Social History  . Marital Status: Single    Spouse Name: N/A  . Number of Children: 2  . Years of Education: N/A   Occupational History  .      Dental office   Social History Main Topics  . Smoking status: Current Every Day Smoker -- 1.00 packs/day for 40 years    Types: Cigarettes  . Smokeless tobacco: Never Used  . Alcohol Use: 0.0 oz/week    0 Standard drinks or equivalent per week     Comment: twice per week - 2-6 beers on each occasion  . Drug Use: No  . Sexual Activity: Yes   Other Topics Concern  . Not on file   Social History Narrative    ROS: no fevers or chills, productive cough, hemoptysis, dysphasia, odynophagia, melena, hematochezia, dysuria, hematuria, rash, seizure activity, orthopnea, PND, pedal edema, claudication. Remaining systems are negative.  Physical Exam: Well-developed well-nourished in no acute distress.  Skin is warm and dry.  HEENT is normal.  Neck is supple.  Chest is clear to  auscultation with normal expansion.  Cardiovascular exam is regular rate and rhythm. 1/6 systolic and diastolic murmur left sternal border. Abdominal exam nontender or distended. No masses palpated. Extremities show no edema. neuro grossly intact

## 2015-01-17 ENCOUNTER — Other Ambulatory Visit: Payer: Self-pay | Admitting: Physician Assistant

## 2015-01-17 ENCOUNTER — Other Ambulatory Visit (INDEPENDENT_AMBULATORY_CARE_PROVIDER_SITE_OTHER): Payer: BLUE CROSS/BLUE SHIELD | Admitting: *Deleted

## 2015-01-17 DIAGNOSIS — I10 Essential (primary) hypertension: Secondary | ICD-10-CM

## 2015-01-17 DIAGNOSIS — E785 Hyperlipidemia, unspecified: Secondary | ICD-10-CM

## 2015-01-17 DIAGNOSIS — R002 Palpitations: Secondary | ICD-10-CM

## 2015-01-17 LAB — HEPATIC FUNCTION PANEL
ALT: 60 U/L — ABNORMAL HIGH (ref 6–29)
AST: 40 U/L — AB (ref 10–35)
Albumin: 4 g/dL (ref 3.6–5.1)
Alkaline Phosphatase: 72 U/L (ref 33–130)
BILIRUBIN DIRECT: 0.1 mg/dL (ref <=0.2)
BILIRUBIN TOTAL: 0.3 mg/dL (ref 0.2–1.2)
Indirect Bilirubin: 0.2 mg/dL (ref 0.2–1.2)
Total Protein: 6.9 g/dL (ref 6.1–8.1)

## 2015-01-17 LAB — LIPID PANEL
CHOL/HDL RATIO: 9.3 ratio — AB (ref <=5.0)
Cholesterol: 252 mg/dL — ABNORMAL HIGH (ref 125–200)
HDL: 27 mg/dL — ABNORMAL LOW (ref >=46)
Triglycerides: 535 mg/dL — ABNORMAL HIGH (ref <150)

## 2015-01-23 ENCOUNTER — Encounter: Payer: Self-pay | Admitting: *Deleted

## 2015-01-23 ENCOUNTER — Telehealth: Payer: Self-pay | Admitting: Gastroenterology

## 2015-01-23 NOTE — Telephone Encounter (Signed)
Patient notified that instructions have been faxed.

## 2015-01-23 NOTE — Telephone Encounter (Signed)
Faxed instructions to number provided.

## 2015-01-24 ENCOUNTER — Encounter: Payer: BLUE CROSS/BLUE SHIELD | Admitting: Cardiology

## 2015-01-24 ENCOUNTER — Ambulatory Visit (INDEPENDENT_AMBULATORY_CARE_PROVIDER_SITE_OTHER): Payer: BLUE CROSS/BLUE SHIELD | Admitting: Cardiology

## 2015-01-24 ENCOUNTER — Encounter: Payer: Self-pay | Admitting: Cardiology

## 2015-01-24 VITALS — BP 126/74 | HR 84 | Ht 64.0 in | Wt 163.6 lb

## 2015-01-24 DIAGNOSIS — Q231 Congenital insufficiency of aortic valve: Secondary | ICD-10-CM

## 2015-01-24 DIAGNOSIS — Z720 Tobacco use: Secondary | ICD-10-CM

## 2015-01-24 DIAGNOSIS — I10 Essential (primary) hypertension: Secondary | ICD-10-CM

## 2015-01-24 DIAGNOSIS — R072 Precordial pain: Secondary | ICD-10-CM | POA: Diagnosis not present

## 2015-01-24 DIAGNOSIS — R002 Palpitations: Secondary | ICD-10-CM

## 2015-01-24 DIAGNOSIS — I671 Cerebral aneurysm, nonruptured: Secondary | ICD-10-CM | POA: Insufficient documentation

## 2015-01-24 DIAGNOSIS — Z79899 Other long term (current) drug therapy: Secondary | ICD-10-CM

## 2015-01-24 DIAGNOSIS — E785 Hyperlipidemia, unspecified: Secondary | ICD-10-CM

## 2015-01-24 MED ORDER — ROSUVASTATIN CALCIUM 10 MG PO TABS: 10.0000 mg | ORAL_TABLET | Freq: Every day | ORAL | Status: DC

## 2015-01-24 NOTE — Patient Instructions (Addendum)
Your doctor has ordered a yearly [facility] the head at [facility] at [address]  Your physician has recommended you make the following change in your medication: start Crestor 10 mg daily  Your physician recommends that you return for lab work in: one month FASTING at [facility]  Your physician recommends that you schedule a follow-up appointment in: one year with Dr. Jens Somrenshaw

## 2015-01-24 NOTE — Assessment & Plan Note (Signed)
Reasonably well controlled. Continue Beta blocker.

## 2015-01-24 NOTE — Assessment & Plan Note (Signed)
No further chest pain. Treadmill negative. No plans for further evaluation at this point. 

## 2015-01-24 NOTE — Assessment & Plan Note (Signed)
Patient has not tolerated Pravachol or Lipitor. I will try Crestor 10 mg daily. Check lipids and liver in 4 weeks.

## 2015-01-24 NOTE — Assessment & Plan Note (Signed)
Schedule follow-upMRA.

## 2015-01-24 NOTE — Assessment & Plan Note (Signed)
Patient counseled on discontinuing. 

## 2015-01-24 NOTE — Assessment & Plan Note (Signed)
Blood pressure controlled. Continue present medications. 

## 2015-01-24 NOTE — Assessment & Plan Note (Signed)
Most recent echocardiogram in September 2016 showed mild aortic insufficiency. She will need follow-up studies in the future.

## 2015-01-27 ENCOUNTER — Ambulatory Visit (AMBULATORY_SURGERY_CENTER): Payer: BLUE CROSS/BLUE SHIELD | Admitting: Gastroenterology

## 2015-01-27 ENCOUNTER — Encounter: Payer: Self-pay | Admitting: Gastroenterology

## 2015-01-27 VITALS — BP 113/65 | HR 58 | Temp 97.7°F | Resp 19 | Ht 63.0 in | Wt 167.0 lb

## 2015-01-27 DIAGNOSIS — Z1211 Encounter for screening for malignant neoplasm of colon: Secondary | ICD-10-CM

## 2015-01-27 DIAGNOSIS — K635 Polyp of colon: Secondary | ICD-10-CM | POA: Diagnosis not present

## 2015-01-27 DIAGNOSIS — R0789 Other chest pain: Secondary | ICD-10-CM | POA: Diagnosis not present

## 2015-01-27 DIAGNOSIS — K219 Gastro-esophageal reflux disease without esophagitis: Secondary | ICD-10-CM | POA: Diagnosis present

## 2015-01-27 DIAGNOSIS — R197 Diarrhea, unspecified: Secondary | ICD-10-CM | POA: Diagnosis not present

## 2015-01-27 MED ORDER — SODIUM CHLORIDE 0.9 % IV SOLN: 500.0000 mL | INTRAVENOUS | Status: DC

## 2015-01-27 NOTE — Progress Notes (Signed)
Called to room to assist during endoscopic procedure.  Patient ID and intended procedure confirmed with present staff. Received instructions for my participation in the procedure from the performing physician.  

## 2015-01-27 NOTE — Op Note (Signed)
[facility] [address] [place], [id number]   COLONOSCOPY PROCEDURE REPORT  PATIENT: Mary Wade, Mary Wade  MR#: [id number] BIRTHDATE: 05/20/1960 , 54  yrs. old GENDER: female ENDOSCOPIST: Benancio DeedsSteven P Ashli Selders, MD REFERRED BY: PROCEDURE DATE:  01/27/2015 PROCEDURE:   Colonoscopy, diagnostic, Colonoscopy with biopsy, and Colonoscopy, screening First Screening Colonoscopy - Avg.  risk and is 50 yrs.  old or older Yes.  Prior Negative Screening - Now for repeat screening. N/A  History of Adenoma - Now for follow-up colonoscopy & has been > or = to 3 yrs.  N/A  Polyps removed today? Yes ASA CLASS:   Class II INDICATIONS:Colorectal Neoplasm Risk Assessment for this procedure is average risk and unexplained diarrhea. MEDICATIONS: Propofol 100 mg IV  DESCRIPTION OF PROCEDURE:   After the risks benefits and alternatives of the procedure were thoroughly explained, informed consent was obtained.  The digital rectal exam revealed no abnormalities of the rectum.   The LB 1528  endoscope was introduced through the anus and advanced to the terminal ileum which was intubated for a short distance. No adverse events experienced.   The quality of the prep was adequate  The instrument was then slowly withdrawn as the colon was fully examined. Estimated blood loss is zero unless otherwise noted in this procedure report.   COLON FINDINGS: There was a 3-4 mm sessile polyp in the hepatic flexure which was removed with cold forceps.  The colon was otherwise normal without inflammatory changes - no overt evidence of IBD, but biopsies taken to rule out microscopic colitis. Pill fragments were noted in the cecum but good views of the cecum were obtained with lavage. The terminal ileum was intubated and appeared normal.  Retroflexed views revealed small internal hemorrhoids. The time to cecum = 1.6 Withdrawal time = 9.9   The scope was withdrawn and the procedure completed. COMPLICATIONS:  There were no immediate complications.  ENDOSCOPIC IMPRESSION: Small hepatic flexure polyp Otherwise normal colon without inflammatory changes- biopsies taken to rule out microscopic colitis  RECOMMENDATIONS: Await pathology results Resume diet Resume medications  eSigned:  Benancio DeedsSteven P Sham Alviar, MD 01/27/2015 10:31 AM   cc: the patient

## 2015-01-27 NOTE — Patient Instructions (Addendum)
YOU HAD AN ENDOSCOPIC PROCEDURE TODAY AT THE [facility]:   Refer to the procedure report that was given to you for any specific questions about what was found during the examination.  If the procedure report does not answer your questions, please call your gastroenterologist to clarify.  If you requested that your care partner not be given the details of your procedure findings, then the procedure report has been included in a sealed envelope for you to review at your convenience later.  YOU SHOULD EXPECT: Some feelings of bloating in the abdomen. Passage of more gas than usual.  Walking can help get rid of the air that was put into your GI tract during the procedure and reduce the bloating. If you had a lower endoscopy (such as a colonoscopy or flexible sigmoidoscopy) you may notice spotting of blood in your stool or on the toilet paper. If you underwent a bowel prep for your procedure, you may not have a normal bowel movement for a few days.  Please Note:  You might notice some irritation and congestion in your nose or some drainage.  This is from the oxygen used during your procedure.  There is no need for concern and it should clear up in a day or so.  SYMPTOMS TO REPORT IMMEDIATELY:   Following lower endoscopy (colonoscopy or flexible sigmoidoscopy):  Excessive amounts of blood in the stool  Significant tenderness or worsening of abdominal pains  Swelling of the abdomen that is new, acute  Fever of 100F or higher   Following upper endoscopy (EGD)  Vomiting of blood or coffee ground material  New chest pain or pain under the shoulder blades  Painful or persistently difficult swallowing  New shortness of breath  Fever of 100F or higher  Black, tarry-looking stools  For urgent or emergent issues, a gastroenterologist can be reached at any hour by calling [phone].   DIET: Your first meal following the procedure should be a small meal and then it is ok to progress to  your normal diet. Heavy or fried foods are harder to digest and may make you feel nauseous or bloated.  Likewise, meals heavy in dairy and vegetables can increase bloating.  Drink plenty of fluids but you should avoid alcoholic beverages for 24 hours.  ACTIVITY:  You should plan to take it easy for the rest of today and you should NOT DRIVE or use heavy machinery until tomorrow (because of the sedation medicines used during the test).    FOLLOW UP: Our staff will call the number listed on your records the next business day following your procedure to check on you and address any questions or concerns that you may have regarding the information given to you following your procedure. If we do not reach you, we will leave a message.  However, if you are feeling well and you are not experiencing any problems, there is no need to return our call.  We will assume that you have returned to your regular daily activities without incident.  If any biopsies were taken you will be contacted by phone or by letter within the next 1-3 weeks.  Please call us at [phone] if you have not heard about the biopsies in 3 weeks.    SIGNATURES/CONFIDENTIALITY: You and/or your care partner have signed paperwork which will be entered into your electronic medical record.  These signatures attest to the fact that that the information above on your After Visit Summary has been reviewed   and is understood.  Full responsibility of the confidentiality of this discharge information lies with you and/or your care-partner.  Handouts were given to your daughter on polyps and hemorrhoids. Per Dr. Adela LankArmbruster to continue taking omeprazole twice daily for about one month, then decrease to once daily and see if holds symptoms. You may resume your current medications today. Await biopsy results. Please call if any questions or concerns.

## 2015-01-27 NOTE — Progress Notes (Signed)
No problems noted in the recovery room. maw 

## 2015-01-27 NOTE — Op Note (Signed)
[facility] [address] [place], [id number]   ENDOSCOPY PROCEDURE REPORT  PATIENT: Mary Wade, Mary Wade  MR#: [id number] BIRTHDATE: 22-Dec-1960 , 54  yrs. old GENDER: female ENDOSCOPIST: Benancio DeedsSteven P Armbruster, MD REFERRED BY: PROCEDURE DATE:  01/27/2015 PROCEDURE:  EGD w/ biopsy ASA CLASS:     Class II INDICATIONS:  chest pain, heartburn, and diarrhea. MEDICATIONS: Propofol 250 mg IV TOPICAL ANESTHETIC:  DESCRIPTION OF PROCEDURE: After the risks benefits and alternatives of the procedure were thoroughly explained, informed consent was obtained.  The LB [id number] endoscope was introduced through the mouth and advanced to the second portion of the duodenum , Without limitations.  The instrument was slowly withdrawn as the mucosa was fully examined.   FINDINGS: The esophagus was normal.  No inflammatory changes or esophagitis were noted.  DH, GEJ, and SCJ located 40cm from the incisors.  The stomach was normal in appearance without abnormalities.  The duodenal bulb and 2nd portion of the duodenum were normal.  Biopsies were taken to rule out celiac disease in light of ongoing diarrhea.  Retroflexed views revealed no abnormalities.     The scope was then withdrawn from the patient and the procedure completed.  COMPLICATIONS: There were no immediate complications.  ENDOSCOPIC IMPRESSION: Normal esophagus Normal stomach Normal duodenum - biopsies taken to rule out celiac in light of symptoms of diarrhea      RECOMMENDATIONS: Continue twice daily PPI as this appears to have signfiicantly improved symptoms of chest pain, which could have been due to reflux. Await biopsy results Resume diet Resume medications    eSigned:  Benancio DeedsSteven P Armbruster, MD 01/27/2015 10:25 AM    CC: the patient  PATIENT NAME:  Mary Wade, Mary Wade MR#: [id number]

## 2015-01-27 NOTE — Addendum Note (Signed)
Addended by: Inocente SallesWESTBROOK, Lyniah Fujita B on: 01/27/2015 04:31 PM   Modules accepted: Orders

## 2015-01-27 NOTE — Progress Notes (Signed)
A/ox3, pleased with MAC, report to RN 

## 2015-01-28 ENCOUNTER — Telehealth: Payer: Self-pay | Admitting: *Deleted

## 2015-01-28 NOTE — Telephone Encounter (Signed)
Left message on f/u call 

## 2015-02-05 ENCOUNTER — Ambulatory Visit
Admission: RE | Admit: 2015-02-05 | Discharge: 2015-02-05 | Disposition: A | Discharge status: Home / Self Care | Payer: BLUE CROSS/BLUE SHIELD | Source: Ambulatory Visit | Attending: Cardiology | Admitting: Cardiology

## 2015-02-05 DIAGNOSIS — I671 Cerebral aneurysm, nonruptured: Secondary | ICD-10-CM

## 2015-02-06 ENCOUNTER — Telehealth: Payer: Self-pay | Admitting: Cardiology

## 2015-02-06 NOTE — Telephone Encounter (Signed)
Pt is calling in stating that she is having a negative reaction to her Crestor. Please f/u  Thanks

## 2015-02-06 NOTE — Telephone Encounter (Signed)
Spoke with pt, she is having pain in both feet and general body aches. She also reports since taking the crestor she has been angry. She is going to stop crestor, okay given.

## 2015-02-06 NOTE — Telephone Encounter (Signed)
Mary Wade is calling to let you know that she is going to stop taking the Crestor . Please call   Thanks

## 2015-02-20 ENCOUNTER — Encounter: Payer: BLUE CROSS/BLUE SHIELD | Admitting: Gastroenterology

## 2015-03-03 IMAGING — CR DG CHEST 2V
2 series · 2 of 2 positions shown · non-contrast
Comparison: 04/15/2013

CLINICAL DATA: Choked on popcorn last night.  Sore throat.

EXAM:
CHEST  2 VIEW

[PA]
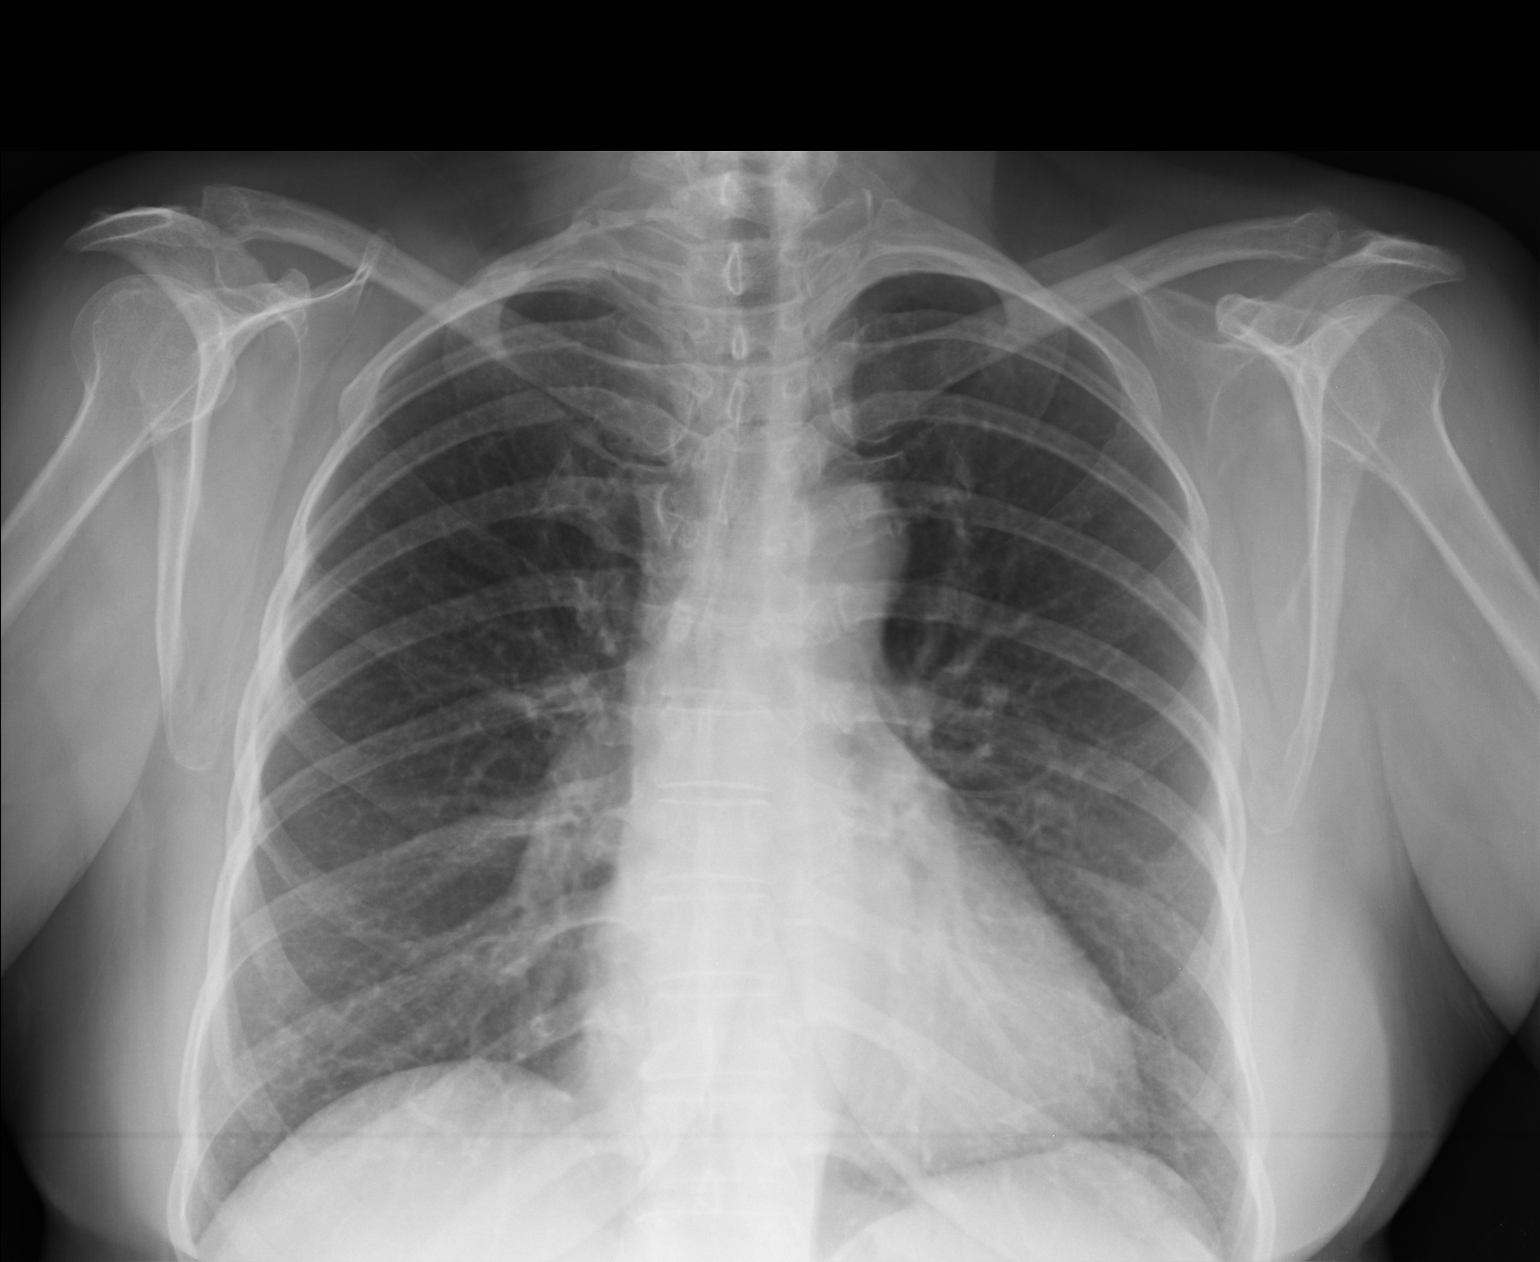

[lateral]
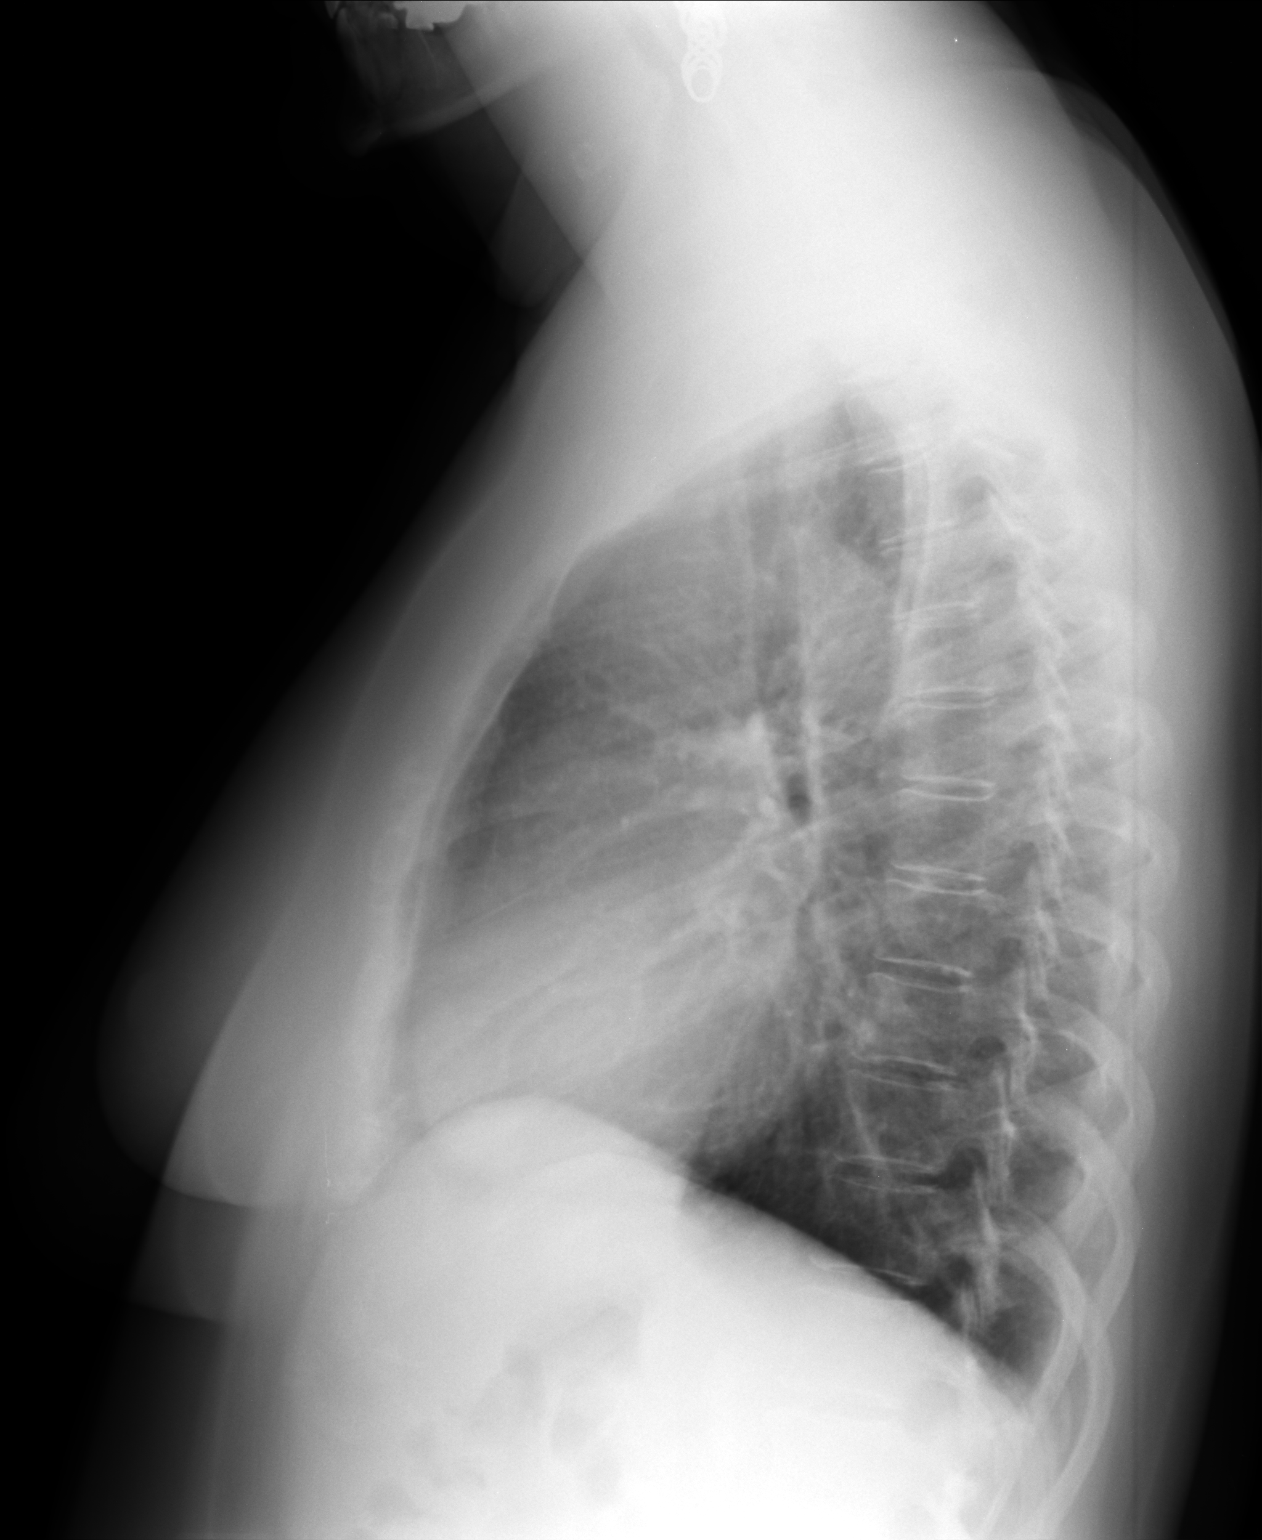

[2 of 2 positions shown; findings below may reference images not displayed]

FINDINGS: Borderline cardiomegaly. Normal vascularity. No pleural effusion and
no pneumothorax.
IMPRESSION: Cardiomegaly without decompensation.

## 2015-03-04 ENCOUNTER — Other Ambulatory Visit: Payer: Self-pay | Admitting: Family Medicine

## 2015-04-18 ENCOUNTER — Other Ambulatory Visit: Payer: Self-pay | Admitting: Family Medicine

## 2015-05-23 ENCOUNTER — Ambulatory Visit (INDEPENDENT_AMBULATORY_CARE_PROVIDER_SITE_OTHER): Payer: BLUE CROSS/BLUE SHIELD | Admitting: Physician Assistant

## 2015-05-23 VITALS — BP 118/76 | HR 96 | Temp 98.1°F | Resp 18 | Ht 65.0 in | Wt 165.0 lb

## 2015-05-23 DIAGNOSIS — E785 Hyperlipidemia, unspecified: Secondary | ICD-10-CM

## 2015-05-23 DIAGNOSIS — M1990 Unspecified osteoarthritis, unspecified site: Secondary | ICD-10-CM

## 2015-05-23 DIAGNOSIS — M19049 Primary osteoarthritis, unspecified hand: Secondary | ICD-10-CM | POA: Insufficient documentation

## 2015-05-23 DIAGNOSIS — Z720 Tobacco use: Secondary | ICD-10-CM | POA: Diagnosis not present

## 2015-05-23 DIAGNOSIS — G629 Polyneuropathy, unspecified: Secondary | ICD-10-CM | POA: Insufficient documentation

## 2015-05-23 DIAGNOSIS — I10 Essential (primary) hypertension: Secondary | ICD-10-CM | POA: Diagnosis not present

## 2015-05-23 LAB — COMPLETE METABOLIC PANEL WITH GFR
ALBUMIN: 4.2 g/dL (ref 3.6–5.1)
ALK PHOS: 58 U/L (ref 33–130)
ALT: 27 U/L (ref 6–29)
AST: 17 U/L (ref 10–35)
BUN: 14 mg/dL (ref 7–25)
CALCIUM: 9.3 mg/dL (ref 8.6–10.4)
CHLORIDE: 107 mmol/L (ref 98–110)
CO2: 23 mmol/L (ref 20–31)
CREATININE: 0.53 mg/dL (ref 0.50–1.05)
GFR, Est African American: >89 mL/min (ref >=60)
GFR, Est Non African American: >89 mL/min (ref >=60)
GLUCOSE: 110 mg/dL — AB (ref 65–99)
POTASSIUM: 4.2 mmol/L (ref 3.5–5.3)
SODIUM: 141 mmol/L (ref 135–146)
Total Bilirubin: 0.4 mg/dL (ref 0.2–1.2)
Total Protein: 6.5 g/dL (ref 6.1–8.1)

## 2015-05-23 LAB — VITAMIN B12: Vitamin B-12: 322 pg/mL (ref 200–1100)

## 2015-05-23 LAB — TSH: TSH: 0.92 mIU/L

## 2015-05-23 MED ORDER — CELECOXIB 100 MG PO CAPS: 100.0000 mg | ORAL_CAPSULE | Freq: Every day | ORAL | Status: DC

## 2015-05-23 MED ORDER — BISOPROLOL-HYDROCHLOROTHIAZIDE 10-6.25 MG PO TABS: ORAL_TABLET | ORAL | Status: DC

## 2015-05-23 NOTE — Progress Notes (Signed)
Mary SermonSusan M Wade  MRN: [id number] DOB: 1960/06/13  Subjective:  Pt presents to clinic for medication refill and her arthritis is acting up in her hands and feet.  She has seen Dr Amanda PeaGramig for her left hand arthritis but he has been getting worse - she has been using motrin 800mg  q6h which helps but does not make the pain resolve and the gut side effects are getting worse in regards to diarrhea.  The pain is worse when the weather changes and when it is cold and after she has used her hands a lot.   She also needs refills of her HTN medications - she tolerated it well.  She has elevated cholesterol and she has tried 2 statins and has been unable to tolerate either of them.  She took Niacin for a while and then stopped it.  She Is also having some burning sensation in her feet - the more she is on them the more they burn - most of the burning is in her toes - she is not interested in anything for the pain currently but she did want to make me aware of her pain.  Dental assistant -   Patient Active Problem List   Diagnosis Date Noted  . Cerebral aneurysm 01/24/2015  . Right carotid bruit 10/17/2014  . Chest pain 10/17/2014  . Palpitations 09/28/2013  . Bicuspid aortic valve 09/28/2013  . Hyperlipidemia 09/28/2013  . Tobacco abuse 09/28/2013  . Essential hypertension 09/28/2013    Current Outpatient Prescriptions on File Prior to Visit  Medication Sig Dispense Refill  . HYDROcodone-acetaminophen (NORCO/VICODIN) 5-325 MG per tablet Take 1 tablet by mouth every 6 (six) hours as needed for moderate pain.    Marland Kitchen. ibuprofen (ADVIL,MOTRIN) 200 MG tablet Take 200 mg by mouth every 6 (six) hours as needed for mild pain or moderate pain.    Marland Kitchen. omeprazole (PRILOSEC) 20 MG capsule Take 2 capsules (40 mg total) by mouth daily. 30 capsule 11  . promethazine (PHENERGAN) 25 MG tablet Take 25 mg by mouth every 6 (six) hours as needed for nausea or vomiting.     No current facility-administered medications on file  prior to visit.    Allergies  Allergen Reactions  . Crestor [Rosuvastatin]     Mood changes  . Pravastatin     Muscle aches, elevated LFTs    Review of Systems  Constitutional: Negative for fever and chills.  Respiratory: Negative for shortness of breath.   Cardiovascular: Negative for chest pain, palpitations and leg swelling.  Musculoskeletal: Positive for joint swelling (sometimes esp in her 4th left finger). Negative for gait problem.   Objective:  BP 118/76 mmHg  Pulse 96  Temp(Src) 98.1 F (36.7 C) (Oral)  Resp 18  Ht 5\' 5"  (1.651 m)  Wt 165 lb (74.844 kg)  BMI 27.46 kg/m2  SpO2 98%  Physical Exam  Constitutional: She is oriented to person, place, and time and well-developed, well-nourished, and in no distress.  HENT:  Head: Normocephalic and atraumatic.  Right Ear: Hearing and external ear normal.  Left Ear: Hearing and external ear normal.  Eyes: Conjunctivae are normal.  Neck: Normal range of motion.  Cardiovascular: Normal rate, regular rhythm and normal heart sounds.   No murmur heard. Pulmonary/Chest: Effort normal and breath sounds normal.  Musculoskeletal:       Arms:      Right lower leg: She exhibits no edema.       Left lower leg: She exhibits no edema.  Neurological: She is alert and oriented to person, place, and time. Gait normal.  Skin: Skin is warm and dry.  Normal filament sensation testing bilateral feet  Psychiatric: Mood, memory, affect and judgment normal.  Vitals reviewed.   Assessment and Plan :  Essential hypertension - Plan: COMPLETE METABOLIC PANEL WITH GFR, TSH, bisoprolol-hydrochlorothiazide (ZIAC) 10-6.25 MG tablet, Care order/instruction - well controlled - continue good eating habits and continue current medications  Hyperlipidemia - continue fish oil and pt to add Niacin OTC - she has not fasted today so she will try and make good dietary changes and then recheck with me in 3 months for lipid check  Tobacco abuse -  encouraged smoking cessation -   Neuropathy (HCC) - Plan: Vitamin B12 - check labs - pt is not interested in medications at this time - she knows that a healthy diet make the pain less and that is what she wants to focus on  Arthritis of hand - Plan: celecoxib (CELEBREX) 100 MG capsule - trial of Celebrex to decrease motrin use   Benny Lennert PA-C  [facility] 05/23/2015 11:19 AM

## 2015-05-23 NOTE — Patient Instructions (Addendum)
Niacin - take with an ASA 81mg  EC 1 hour prior to taking the Niacin and then take the niacin with a high fiber snack (like 3 trisquits)  Recheck in 3 months fasting for your cholesterol to be checked   IF you received an x-ray today, you will receive an invoice from [facility]. Please contact [facility] at [phone] with questions or concerns regarding your invoice.   IF you received labwork today, you will receive an invoice from [facility]. Please contact [facility] at [phone] with questions or concerns regarding your invoice.   Our billing staff will not be able to assist you with questions regarding bills from these companies.  You will be contacted with the lab results as soon as they are available. The fastest way to get your results is to activate your My Chart account. Instructions are located on the last page of this paperwork. If you have not heard from us regarding the results in 2 weeks, please contact this office.

## 2015-05-27 ENCOUNTER — Encounter: Payer: Self-pay | Admitting: Physician Assistant

## 2015-10-31 ENCOUNTER — Encounter: Payer: Self-pay | Admitting: Family Medicine

## 2015-10-31 ENCOUNTER — Ambulatory Visit (INDEPENDENT_AMBULATORY_CARE_PROVIDER_SITE_OTHER): Payer: Self-pay | Admitting: Family Medicine

## 2015-10-31 ENCOUNTER — Telehealth: Payer: Self-pay

## 2015-10-31 VITALS — BP 113/76 | HR 68 | Ht 64.0 in | Wt 163.7 lb

## 2015-10-31 DIAGNOSIS — I493 Ventricular premature depolarization: Secondary | ICD-10-CM

## 2015-10-31 DIAGNOSIS — E785 Hyperlipidemia, unspecified: Secondary | ICD-10-CM

## 2015-10-31 DIAGNOSIS — F411 Generalized anxiety disorder: Secondary | ICD-10-CM

## 2015-10-31 DIAGNOSIS — R197 Diarrhea, unspecified: Secondary | ICD-10-CM | POA: Insufficient documentation

## 2015-10-31 DIAGNOSIS — I10 Essential (primary) hypertension: Secondary | ICD-10-CM

## 2015-10-31 DIAGNOSIS — K859 Acute pancreatitis without necrosis or infection, unspecified: Secondary | ICD-10-CM | POA: Insufficient documentation

## 2015-10-31 DIAGNOSIS — K219 Gastro-esophageal reflux disease without esophagitis: Secondary | ICD-10-CM | POA: Insufficient documentation

## 2015-10-31 DIAGNOSIS — Z720 Tobacco use: Secondary | ICD-10-CM

## 2015-10-31 DIAGNOSIS — Z716 Tobacco abuse counseling: Secondary | ICD-10-CM | POA: Insufficient documentation

## 2015-10-31 DIAGNOSIS — F430 Acute stress reaction: Secondary | ICD-10-CM

## 2015-10-31 MED ORDER — BUPROPION HCL ER (XL) 150 MG PO TB24: ORAL_TABLET | ORAL | 0 refills | Status: DC

## 2015-10-31 MED ORDER — ALPRAZOLAM 0.5 MG PO TABS: 0.5000 mg | ORAL_TABLET | Freq: Every day | ORAL | 0 refills | Status: DC | PRN

## 2015-10-31 NOTE — Telephone Encounter (Signed)
Per [name], pt was left a voicemail with instructions.  Mary Wade. Nelson, CMA

## 2015-10-31 NOTE — Assessment & Plan Note (Signed)
Upper GI/ EGD and Colonoscpy in Fall 2016--> both clean

## 2015-10-31 NOTE — Patient Instructions (Addendum)
Www.heart.org-  Look up smoking cess         Smoking Cessation, Tips for Success If you are ready to quit smoking, congratulations! You have chosen to help yourself be healthier. Cigarettes bring nicotine, tar, carbon monoxide, and other irritants into your body. Your lungs, heart, and blood vessels will be able to work better without these poisons. There are many different ways to quit smoking. Nicotine gum, nicotine patches, a nicotine inhaler, or nicotine nasal spray can help with physical craving. Hypnosis, support groups, and medicines help break the habit of smoking. WHAT THINGS CAN I DO TO MAKE QUITTING EASIER?  Here are some tips to help you quit for good:  Pick a date when you will quit smoking completely. Tell all of your friends and family about your plan to quit on that date.  Do not try to slowly cut down on the number of cigarettes you are smoking. Pick a quit date and quit smoking completely starting on that day.  Throw away all cigarettes.   Clean and remove all ashtrays from your home, work, and car.  On a card, write down your reasons for quitting. Carry the card with you and read it when you get the urge to smoke.  Cleanse your body of nicotine. Drink enough water and fluids to keep your urine clear or pale yellow. Do this after quitting to flush the nicotine from your body.  Learn to predict your moods. Do not let a bad situation be your excuse to have a cigarette. Some situations in your life might tempt you into wanting a cigarette.  Never have "just one" cigarette. It leads to wanting another and another. Remind yourself of your decision to quit.  Change habits associated with smoking. If you smoked while driving or when feeling stressed, try other activities to replace smoking. Stand up when drinking your coffee. Brush your teeth after eating. Sit in a different chair when you read the paper. Avoid alcohol while trying to quit, and try to drink fewer caffeinated  beverages. Alcohol and caffeine may urge you to smoke.  Avoid foods and drinks that can trigger a desire to smoke, such as sugary or spicy foods and alcohol.  Ask people who smoke not to smoke around you.  Have something planned to do right after eating or having a cup of coffee. For example, plan to take a walk or exercise.  Try a relaxation exercise to calm you down and decrease your stress. Remember, you may be tense and nervous for the first 2 weeks after you quit, but this will pass.  Find new activities to keep your hands busy. Play with a pen, coin, or rubber band. Doodle or draw things on paper.  Brush your teeth right after eating. This will help cut down on the craving for the taste of tobacco after meals. You can also try mouthwash.   Use oral substitutes in place of cigarettes. Try using lemon drops, carrots, cinnamon sticks, or chewing gum. Keep them handy so they are available when you have the urge to smoke.  When you have the urge to smoke, try deep breathing.  Designate your home as a nonsmoking area.  If you are a heavy smoker, ask your health care provider about a prescription for nicotine chewing gum. It can ease your withdrawal from nicotine.  Reward yourself. Set aside the cigarette money you save and buy yourself something nice.  Look for support from others. Join a support group or smoking cessation  program. Ask someone at home or at work to help you with your plan to quit smoking.  Always ask yourself, "Do I need this cigarette or is this just a reflex?" Tell yourself, "Today, I choose not to smoke," or "I do not want to smoke." You are reminding yourself of your decision to quit.  Do not replace cigarette smoking with electronic cigarettes (commonly called e-cigarettes). The safety of e-cigarettes is unknown, and some may contain harmful chemicals.  If you relapse, do not give up! Plan ahead and think about what you will do the next time you get the urge to  smoke. HOW WILL I FEEL WHEN I QUIT SMOKING? You may have symptoms of withdrawal because your body is used to nicotine (the addictive substance in cigarettes). You may crave cigarettes, be irritable, feel very hungry, cough often, get headaches, or have difficulty concentrating. The withdrawal symptoms are only temporary. They are strongest when you first quit but will go away within 10-14 days. When withdrawal symptoms occur, stay in control. Think about your reasons for quitting. Remind yourself that these are signs that your body is healing and getting used to being without cigarettes. Remember that withdrawal symptoms are easier to treat than the major diseases that smoking can cause.  Even after the withdrawal is over, expect periodic urges to smoke. However, these cravings are generally short lived and will go away whether you smoke or not. Do not smoke! WHAT RESOURCES ARE AVAILABLE TO HELP ME QUIT SMOKING? Your health care provider can direct you to community resources or hospitals for support, which may include:  Group support.  Education.  Hypnosis.  Therapy.   This information is not intended to replace advice given to you by your health care provider. Make sure you discuss any questions you have with your health care provider.   Document Released: 10/31/2003 Document Revised: 02/22/2014 Document Reviewed: 07/20/2012 Elsevier Interactive Patient Education 2016 [employer].      Steps to Quit Smoking  Smoking tobacco can be harmful to your health and can affect almost every organ in your body. Smoking puts you, and those around you, at risk for developing many serious chronic diseases. Quitting smoking is difficult, but it is one of the best things that you can do for your health. It is never too late to quit. WHAT ARE THE BENEFITS OF QUITTING SMOKING? When you quit smoking, you lower your risk of developing serious diseases and conditions, such as:  Lung cancer or lung  disease, such as COPD.  Heart disease.  Stroke.  Heart attack.  Infertility.  Osteoporosis and bone fractures. Additionally, symptoms such as coughing, wheezing, and shortness of breath may get better when you quit. You may also find that you get sick less often because your body is stronger at fighting off colds and infections. If you are pregnant, quitting smoking can help to reduce your chances of having a baby of low birth weight. HOW DO I GET READY TO QUIT? When you decide to quit smoking, create a plan to make sure that you are successful. Before you quit:  Pick a date to quit. Set a date within the next two weeks to give you time to prepare.  Write down the reasons why you are quitting. Keep this list in places where you will see it often, such as on your bathroom mirror or in your car or wallet.  Identify the people, places, things, and activities that make you want to smoke (triggers) and avoid  them. Make sure to take these actions:  Throw away all cigarettes at home, at work, and in your car.  Throw away smoking accessories, such as Set designerashtrays and lighters.  Clean your car and make sure to empty the ashtray.  Clean your home, including curtains and carpets.  Tell your family, friends, and coworkers that you are quitting. Support from your loved ones can make quitting easier.  Talk with your health care provider about your options for quitting smoking.  Find out what treatment options are covered by your health insurance. WHAT STRATEGIES CAN I USE TO QUIT SMOKING?  Talk with your healthcare provider about different strategies to quit smoking. Some strategies include:  Quitting smoking altogether instead of gradually lessening how much you smoke over a period of time. Research shows that quitting "cold [place]" is more successful than gradually quitting.  Attending in-person counseling to help you build problem-solving skills. You are more likely to have success in  quitting if you attend several counseling sessions. Even short sessions of 10 minutes can be effective.  Finding resources and support systems that can help you to quit smoking and remain smoke-free after you quit. These resources are most helpful when you use them often. They can include:  Online chats with a Veterinary surgeoncounselor.  Telephone quitlines.  Printed Materials engineerself-help materials.  Support groups or group counseling.  Text messaging programs.  Mobile phone applications.  Taking medicines to help you quit smoking. (If you are pregnant or breastfeeding, talk with your health care provider first.) Some medicines contain nicotine and some do not. Both types of medicines help with cravings, but the medicines that include nicotine help to relieve withdrawal symptoms. Your health care provider may recommend:  Nicotine patches, gum, or lozenges.  Nicotine inhalers or sprays.  Non-nicotine medicine that is taken by mouth. Talk with your health care provider about combining strategies, such as taking medicines while you are also receiving in-person counseling. Using these two strategies together makes you more likely to succeed in quitting than if you used either strategy on its own. If you are pregnant or breastfeeding, talk with your health care provider about finding counseling or other support strategies to quit smoking. Do not take medicine to help you quit smoking unless told to do so by your health care provider. WHAT THINGS CAN I DO TO MAKE IT EASIER TO QUIT? Quitting smoking might feel overwhelming at first, but there is a lot that you can do to make it easier. Take these important actions:  Reach out to your family and friends and ask that they support and encourage you during this time. Call telephone quitlines, reach out to support groups, or work with a counselor for support.  Ask people who smoke to avoid smoking around you.  Avoid places that trigger you to smoke, such as bars, parties,  or smoke-break areas at work.  Spend time around people who do not smoke.  Lessen stress in your life, because stress can be a smoking trigger for some people. To lessen stress, try:  Exercising regularly.  Deep-breathing exercises.  Yoga.  Meditating.  Performing a body scan. This involves closing your eyes, scanning your body from head to toe, and noticing which parts of your body are particularly tense. Purposefully relax the muscles in those areas.  Download or purchase mobile phone or tablet apps (applications) that can help you stick to your quit plan by providing reminders, tips, and encouragement. There are many free apps, such as QuitGuide from the  CDC (Centers for Disease Control and Prevention). You can find other support for quitting smoking (smoking cessation) through smokefree.gov and other websites. HOW WILL I FEEL WHEN I QUIT SMOKING? Within the first 24 hours of quitting smoking, you may start to feel some withdrawal symptoms. These symptoms are usually most noticeable 2-3 days after quitting, but they usually do not last beyond 2-3 weeks. Changes or symptoms that you might experience include:  Mood swings.  Restlessness, anxiety, or irritation.  Difficulty concentrating.  Dizziness.  Strong cravings for sugary foods in addition to nicotine.  Mild weight gain.  Constipation.  Nausea.  Coughing or a sore throat.  Changes in how your medicines work in your body.  A depressed mood.  Difficulty sleeping (insomnia). After the first 2-3 weeks of quitting, you may start to notice more positive results, such as:  Improved sense of smell and taste.  Decreased coughing and sore throat.  Slower heart rate.  Lower blood pressure.  Clearer skin.  The ability to breathe more easily.  Fewer sick days. Quitting smoking is very challenging for most people. Do not get discouraged if you are not successful the first time. Some people need to make many attempts  to quit before they achieve long-term success. Do your best to stick to your quit plan, and talk with your health care provider if you have any questions or concerns.   This information is not intended to replace advice given to you by your health care provider. Make sure you discuss any questions you have with your health care provider.   Document Released: 01/26/2001 Document Revised: 06/18/2014 Document Reviewed: 06/18/2014 Elsevier Interactive Patient Education [employer]

## 2015-10-31 NOTE — Telephone Encounter (Signed)
-----   Message from Thomasene Loteborah Opalski, DO sent at 10/31/2015 11:53 AM EDT ----- Can you please call Mrs. Deterding and Let her know that the extended release tablet cannot be cut in half but I think it's fine for her to take the whole tablet.    We will be increasing it next office visit as tolerated since 300-450mg  mg per day is usual dose.   Thanks for calling and reassuring her.

## 2015-10-31 NOTE — Progress Notes (Signed)
New patient office visit note:  Impression and Recommendations:    1. Anxiety state   2. Emotional crisis as acute reaction to exceptional (gross) stress   3. Tobacco abuse counseling   4. Tobacco abuse   5. Essential hypertension   6. Hyperlipidemia   7. h/o PVC's (premature ventricular contractions)   8. chronic diarrhea   9. Gastroesophageal reflux disease, esophagitis presence not specified     - Discussed with patient Wellbutrin would be appropriate since she was like to quit smoking. She heard good things about this medicine would like to try. See her back in 4 weeks, passively increased dose then and consider smoking cessation goals  - Use Xanax sparingly just as needed for panic and an uncontrollable emotional agitation state. Patient understands this is not chronic treatment and just for her recent tragedy that is occurred to her family.  She tells me she is buying it off of a friend right now and really feels she needs it. She has used them on and off for many, many years.  Denies sedated affect  - We discussed stress management and ways to improve sleep through deep breathing, sleep meditation etc. Encouraged daily walking of at least 20 minutes per day for stress management.  - Extensive counseling regarding tobacco abuse, patient is not emotionally ready to quit but will consider in near future upon follow-up.  Gave her extensive handouts and referred her to [employer] to read and become emotionally invested in her journey of quitting smoking.  - Blood pressure stable continue meds  - We'll need fasting labs in near future- patient will schedule this.  - Appreciate occasional PVC on exam today, had an event monitor and September 2015 which showed normal sinus rhythm with occasional PVCs   - GERD:  Upper GI/ EGD and Colonoscpy in Fall 2016--> both clean   Pt was in the office today for 40+ minutes, with over 50% time spent in face to face  counseling of various medical concerns and in coordination of care  New Prescriptions   ALPRAZOLAM (XANAX) 0.5 MG TABLET    Take 1 tablet (0.5 mg total) by mouth daily as needed for anxiety.   BUPROPION (WELLBUTRIN XL) 150 MG 24 HR TABLET    Take one half tablet for 8 days then go to one full tablet.    Modified Medications   No medications on file    Discontinued Medications   CELECOXIB (CELEBREX) 100 MG CAPSULE    Take 1-2 capsules (100-200 mg total) by mouth daily.    Current Meds  Medication Sig  . bisoprolol-hydrochlorothiazide (ZIAC) 10-6.25 MG tablet TAKE 1 TABLET BY MOUTH DAily  . HYDROcodone-acetaminophen (NORCO/VICODIN) 5-325 MG per tablet Take 1 tablet by mouth every 6 (six) hours as needed for moderate pain.  Marland Kitchen ibuprofen (ADVIL,MOTRIN) 200 MG tablet Take 200 mg by mouth every 6 (six) hours as needed for mild pain or moderate pain.  . niacin 50 MG tablet Take 50 mg by mouth at bedtime.  . Omega-3 Fatty Acids (FISH OIL) 1200 MG CAPS Take 3,600 mg by mouth.  Marland Kitchen omeprazole (PRILOSEC) 20 MG capsule Take 2 capsules (40 mg total) by mouth daily.  . promethazine (PHENERGAN) 25 MG tablet Take 25 mg by mouth every 6 (six) hours as needed for nausea or vomiting.    The patient was counseled, risk factors were discussed, anticipatory guidance given.  Gross side effects, risk and benefits, and alternatives of  medications discussed with patient.  Patient is aware that all medications have potential side effects and we are unable to predict every side effect or drug-drug interaction that may occur.  Expresses verbal understanding and consents to current therapy plan and treatment regimen.  Return in about 4 weeks (around 11/28/2015) for Follow-up of current medical issues.  Please see AVS handed out to patient at the end of our visit for further patient instructions/ counseling done pertaining to today's office visit.    Note: This document was prepared using Dragon voice recognition  software and may include unintentional dictation errors.  ----------------------------------------------------------------------------------------------------------------------    Subjective:    Chief Complaint  Patient presents with  . Establish Care    HPI: Mary Wade is a pleasant 55 y.o. female who [facility] today to review their medical history with me and establish care.      Dr Deboraha SprangEagle[place] was her [place]CP many yrs.   Then he retired, then went to [facility].   She has been under tremendous stress lately-->  best friend died 3 wks ago, and then she recently adopted a 54 year old Mary Wade, who's brother, Mary Wade who also lived with her in her home for 4 years and then left in June 2016, recently murdered his father and father's friend.  So now, patient has PTSD and is worried that somebody's can come into her house and her her and possibly her her newly adopted son Mary Wade etc.  Mary Wade however is in jail which is reassuring but patient still fears for her and her family safety.  Having panic attacks with heart palpitations and just feels mentally and physically exhausted and achy all over.    Patient is Single, 2 sons and 1 D-  Ages Mary Wade- [id number], Mary Wade - [id number] , Mary Wade  54 yo recently adopted - brother of the recent murderer, Mary Wade, who left her home in June 2016- lived with her for 4 yrs-->  That is who murderer his father and father's lover.    Family history: Father died 46yo suddenly in his sleep- of Cardiomyopathy - I reviewed in full patient's past medical history, family history etc.  She does have a desire to quit smoking yet does not think she could do with the tremendous amount of stress she is under. She has failed Chantix in the past as it caused too much side effects for her.  Wt Readings from Last 3 Encounters:  10/31/15 163 lb 11.2 oz (74.3 kg)  05/23/15 165 lb (74.8 kg)  01/27/15 167 lb (75.8 kg)     BP Readings from Last 3 Encounters:  10/31/15 113/76  05/23/15 118/76  01/27/15 113/65   Pulse Readings from Last 3 Encounters:  10/31/15 68  05/23/15 96  01/27/15 (!) 58   BMI Readings from Last 3 Encounters:  10/31/15 28.10 kg/m  05/23/15 27.46 kg/m  01/27/15 29.58 kg/m      Patient Active Problem List   Diagnosis Date Noted  . Hyperlipidemia 09/28/2013    Priority: High  . Essential hypertension 09/28/2013    Priority: High  . Aortic insufficiency 11/01/2015    Priority: Medium  . GERD (gastroesophageal reflux disease) 10/31/2015    Priority: Medium  . Gallstone Pancreatitis 10/31/2015    Priority: Medium  . Tobacco abuse counseling 10/31/2015    Priority: Medium  . Tobacco abuse 09/28/2013    Priority: Medium  . h/o PVC's (premature ventricular contractions) 11/01/2015  Priority: Low  . dx in past w/ Fibromyalgia 11/01/2015    Priority: Low  . Diarrhea- 2nd to GB removal 10/31/2015    Priority: Low  . h/o Chest pains 10/17/2014    Priority: Low  . Anxiety state 11/01/2015  . Emotional crisis as acute reaction to exceptional (gross) stress 11/01/2015  . Neuropathy (HCC) 05/23/2015  . Arthritis of hand 05/23/2015  . Cerebral aneurysm 01/24/2015  . Right carotid bruit 10/17/2014  . Palpitations 09/28/2013  . Bicuspid aortic valve 09/28/2013     Past Medical History:  Diagnosis Date  . Aortic insufficiency    a. 10/2013: mod AI by echo.  . Bicuspid aortic valve    a. 10/24/13 showed EF 55-60%, no RWMA, bicuspid AV with mod AI, mildly dilated ascending aorta, PASP .  Marland Kitchen Cerebral aneurysm   . Chronic diarrhea   . Fibromyalgia   . Gallstones   . GERD (gastroesophageal reflux disease)   . Heart murmur   . Hyperlipidemia   . Hypertension   . Pancreatitis    2 previous episodes, last one 2013  . PVC's (premature ventricular contractions)    a. 10/2013 event monitor: NSR and PVCs.  . Tobacco abuse      Past Medical History:  Diagnosis  Date  . Aortic insufficiency    a. 10/2013: mod AI by echo.  . Bicuspid aortic valve    a. 10/24/13 showed EF 55-60%, no RWMA, bicuspid AV with mod AI, mildly dilated ascending aorta, PASP .  Marland Kitchen Cerebral aneurysm   . Chronic diarrhea   . Fibromyalgia   . Gallstones   . GERD (gastroesophageal reflux disease)   . Heart murmur   . Hyperlipidemia   . Hypertension   . Pancreatitis    2 previous episodes, last one 2013  . PVC's (premature ventricular contractions)    a. 10/2013 event monitor: NSR and PVCs.  . Tobacco abuse      Past Surgical History:  Procedure Laterality Date  . ABDOMINAL HYSTERECTOMY    . APPENDECTOMY    . CHOLECYSTECTOMY       Family History  Problem Relation Age of Onset  . CAD Mother     Died at 3, several blockages but patient unknown when this started  . Aneurysm Mother     Brain aneurysm - died of this during surgery  . Hypertension Mother   . Hyperlipidemia Mother   . Thyroid disease Mother   . Heart disease Father     Died of MI at age 84 - enlarged heart  . Heart attack Father   . Early death Father   . Diabetes Paternal Grandmother   . Esophageal cancer Cousin   . Ulcerative colitis Daughter   . Diabetes Daughter   . Diabetes Son      History  Drug Use No    History  Alcohol Use  . 0.0 oz/week    Comment: twice per week - 2-6 beers on each occasion    History  Smoking Status  . Current Every Day Smoker  . Packs/day: 1.00  . Years: 42.00  . Types: Cigarettes  Smokeless Tobacco  . Never Used     Patient's Medications  New Prescriptions   ALPRAZOLAM (XANAX) 0.5 MG TABLET    Take 1 tablet (0.5 mg total) by mouth daily as needed for anxiety.   BUPROPION (WELLBUTRIN XL) 150 MG 24 HR TABLET    Take one half tablet for 8 days then go to one full tablet.  Previous Medications   BISOPROLOL-HYDROCHLOROTHIAZIDE (ZIAC) 10-6.25 MG TABLET    TAKE 1 TABLET BY MOUTH DAily   HYDROCODONE-ACETAMINOPHEN (NORCO/VICODIN) 5-325 MG PER  TABLET    Take 1 tablet by mouth every 6 (six) hours as needed for moderate pain.   IBUPROFEN (ADVIL,MOTRIN) 200 MG TABLET    Take 200 mg by mouth every 6 (six) hours as needed for mild pain or moderate pain.   NIACIN 50 MG TABLET    Take 50 mg by mouth at bedtime.   OMEGA-3 FATTY ACIDS (FISH OIL) 1200 MG CAPS    Take 3,600 mg by mouth.   OMEPRAZOLE (PRILOSEC) 20 MG CAPSULE    Take 2 capsules (40 mg total) by mouth daily.   PROMETHAZINE (PHENERGAN) 25 MG TABLET    Take 25 mg by mouth every 6 (six) hours as needed for nausea or vomiting.  Modified Medications   No medications on file  Discontinued Medications   CELECOXIB (CELEBREX) 100 MG CAPSULE    Take 1-2 capsules (100-200 mg total) by mouth daily.    Allergies: Crestor [rosuvastatin] and Pravastatin Review of systems taken directly from patient medical intake form Review of Systems  Constitutional: Negative.   HENT: Negative.   Eyes: Negative.   Cardiovascular: Positive for palpitations.  Gastrointestinal: Negative.   Genitourinary: Negative.   Musculoskeletal: Positive for myalgias.  Skin: Negative.   Neurological: Negative.   Endo/Heme/Allergies: Negative.   Psychiatric/Behavioral: Negative.      Objective:    Blood pressure 113/76, pulse 68, height 5\' 4"  (1.626 m), weight 163 lb 11.2 oz (74.3 kg). Body mass index is 28.1 kg/m. General: Well Developed, well nourished, and in no acute distress.  Neuro: Alert and oriented x3, extra-ocular muscles intact, sensation grossly intact.  HEENT: Normocephalic, atraumatic, pupils equal round reactive to light, neck supple, no gross masses, no carotid bruits appreciated, no JVD apprec Skin: no gross suspicious lesions or rashes  Cardiac: Regular rate and rhythm, occasional PVC, no murmurs rubs or gallops.  Respiratory: Essentially clear to auscultation bilaterally. Not using accessory muscles, speaking in full sentences.  Abdominal: Soft, not grossly distended Musculoskeletal:  Ambulates w/o diff, FROM * 4 ext.  Vasc: less 2 sec cap RF, warm and pink  Psych:  No HI/SI, judgement and insight good, Euthymic mood. Full Affect.

## 2015-11-01 DIAGNOSIS — I351 Nonrheumatic aortic (valve) insufficiency: Secondary | ICD-10-CM | POA: Insufficient documentation

## 2015-11-01 DIAGNOSIS — F411 Generalized anxiety disorder: Secondary | ICD-10-CM | POA: Insufficient documentation

## 2015-11-01 DIAGNOSIS — F430 Acute stress reaction: Secondary | ICD-10-CM | POA: Insufficient documentation

## 2015-11-01 DIAGNOSIS — M797 Fibromyalgia: Secondary | ICD-10-CM | POA: Insufficient documentation

## 2015-11-01 DIAGNOSIS — I493 Ventricular premature depolarization: Secondary | ICD-10-CM | POA: Insufficient documentation

## 2015-11-19 ENCOUNTER — Other Ambulatory Visit (INDEPENDENT_AMBULATORY_CARE_PROVIDER_SITE_OTHER): Payer: Self-pay

## 2015-11-19 ENCOUNTER — Other Ambulatory Visit: Payer: Self-pay

## 2015-11-19 DIAGNOSIS — I10 Essential (primary) hypertension: Secondary | ICD-10-CM

## 2015-11-19 DIAGNOSIS — R7309 Other abnormal glucose: Secondary | ICD-10-CM

## 2015-11-19 DIAGNOSIS — E784 Other hyperlipidemia: Secondary | ICD-10-CM

## 2015-11-19 DIAGNOSIS — E7849 Other hyperlipidemia: Secondary | ICD-10-CM

## 2015-11-19 DIAGNOSIS — D649 Anemia, unspecified: Secondary | ICD-10-CM

## 2015-11-19 DIAGNOSIS — Z1321 Encounter for screening for nutritional disorder: Secondary | ICD-10-CM

## 2015-11-19 DIAGNOSIS — Z1329 Encounter for screening for other suspected endocrine disorder: Secondary | ICD-10-CM

## 2015-11-20 LAB — CBC WITH DIFFERENTIAL/PLATELET
BASOS ABS: 0 {cells}/uL (ref 0–200)
Basophils Relative: 0 %
EOS PCT: 3 %
Eosinophils Absolute: 279 cells/uL (ref 15–500)
HCT: 38.9 % (ref 35.0–45.0)
HEMOGLOBIN: 12.8 g/dL (ref 11.7–15.5)
LYMPHS PCT: 32 %
Lymphs Abs: 2976 cells/uL (ref 850–3900)
MCH: 29.6 pg (ref 27.0–33.0)
MCHC: 32.9 g/dL (ref 32.0–36.0)
MCV: 90 fL (ref 80.0–100.0)
MONOS PCT: 4 %
MPV: 9.3 fL (ref 7.5–12.5)
Monocytes Absolute: 372 cells/uL (ref 200–950)
NEUTROS PCT: 61 %
Neutro Abs: 5673 cells/uL (ref 1500–7800)
PLATELETS: 283 10*3/uL (ref 140–400)
RBC: 4.32 MIL/uL (ref 3.80–5.10)
RDW: 12.9 % (ref 11.0–15.0)
WBC: 9.3 10*3/uL (ref 3.8–10.8)

## 2015-11-20 LAB — COMPLETE METABOLIC PANEL WITH GFR
ALT: 33 U/L — AB (ref 6–29)
AST: 32 U/L (ref 10–35)
Albumin: 4.3 g/dL (ref 3.6–5.1)
Alkaline Phosphatase: 64 U/L (ref 33–130)
BILIRUBIN TOTAL: 0.5 mg/dL (ref 0.2–1.2)
BUN: 14 mg/dL (ref 7–25)
CALCIUM: 9.2 mg/dL (ref 8.6–10.4)
CHLORIDE: 104 mmol/L (ref 98–110)
CO2: 27 mmol/L (ref 20–31)
CREATININE: 0.62 mg/dL (ref 0.50–1.05)
GFR, Est Non African American: >89 mL/min (ref >=60)
Glucose, Bld: 110 mg/dL — ABNORMAL HIGH (ref 65–99)
Potassium: 4.4 mmol/L (ref 3.5–5.3)
Sodium: 140 mmol/L (ref 135–146)
TOTAL PROTEIN: 6.7 g/dL (ref 6.1–8.1)

## 2015-11-20 LAB — LIPID PANEL
CHOLESTEROL: 241 mg/dL — AB (ref 125–200)
HDL: 37 mg/dL — ABNORMAL LOW (ref >=46)
TRIGLYCERIDES: 472 mg/dL — AB (ref <150)
Total CHOL/HDL Ratio: 6.5 Ratio — ABNORMAL HIGH (ref <=5.0)

## 2015-11-20 LAB — TSH: TSH: 1.52 m[IU]/L

## 2015-11-20 LAB — VITAMIN D 25 HYDROXY (VIT D DEFICIENCY, FRACTURES): VIT D 25 HYDROXY: 15 ng/mL — AB (ref 30–100)

## 2015-11-20 LAB — HEMOGLOBIN A1C
Hgb A1c MFr Bld: 5.7 % — ABNORMAL HIGH (ref <5.7)
MEAN PLASMA GLUCOSE: 117 mg/dL

## 2015-11-28 ENCOUNTER — Ambulatory Visit: Payer: Self-pay | Admitting: Family Medicine

## 2015-11-28 NOTE — Progress Notes (Deleted)
Subjective:    HPI: Mary Wade is a 55 y.o. female who presents to [facility] today for   1. Follow-up her anxiety and emotional distress:   We started patient on Wellbutrin to use daily. We were hoping this will help her quit smoking as well.      Also gave patient Xanax to use only sparingly for panic attacks 2. Smoking cessation:    Wt Readings from Last 3 Encounters:  10/31/15 163 lb 11.2 oz (74.3 kg)  05/23/15 165 lb (74.8 kg)  01/27/15 167 lb (75.8 kg)   BP Readings from Last 3 Encounters:  10/31/15 113/76  05/23/15 118/76  01/27/15 113/65   Pulse Readings from Last 3 Encounters:  10/31/15 68  05/23/15 96  01/27/15 (!) 58   BMI Readings from Last 3 Encounters:  10/31/15 28.10 kg/m  05/23/15 27.46 kg/m  01/27/15 29.58 kg/m    Lab Results  Component Value Date   HGBA1C 5.7 (H) 11/19/2015     Patient Care Team    Relationship Specialty Notifications Start End  Thomasene Lot, DO PCP - General Family Medicine  11/19/15   Lewayne Bunting, MD Consulting Physician Cardiology  10/31/15   Dominica Severin, MD Consulting Physician Orthopedic Surgery  10/31/15      Active Clinical Problem List Patient Active Problem List   Diagnosis Date Noted  . Hyperlipidemia 09/28/2013    Priority: High  . Essential hypertension 09/28/2013    Priority: High  . Aortic insufficiency 11/01/2015    Priority: Medium  . GERD (gastroesophageal reflux disease) 10/31/2015    Priority: Medium  . Gallstone Pancreatitis 10/31/2015    Priority: Medium  . Tobacco abuse counseling 10/31/2015    Priority: Medium  . Tobacco abuse 09/28/2013    Priority: Medium  . h/o PVC's (premature ventricular contractions) 11/01/2015    Priority: Low  . dx in past w/ Fibromyalgia 11/01/2015    Priority: Low  . Diarrhea- 2nd to GB removal 10/31/2015    Priority: Low  . h/o Chest pains 10/17/2014    Priority: Low  . Anxiety state 11/01/2015  . Emotional crisis  as acute reaction to exceptional (gross) stress 11/01/2015  . Neuropathy (HCC) 05/23/2015  . Arthritis of hand 05/23/2015  . Cerebral aneurysm 01/24/2015  . Right carotid bruit 10/17/2014  . Palpitations 09/28/2013  . Bicuspid aortic valve 09/28/2013    Past Medical History: Past Medical History:  Diagnosis Date  . Aortic insufficiency    a. 10/2013: mod AI by echo.  . Bicuspid aortic valve    a. 10/24/13 showed EF 55-60%, no RWMA, bicuspid AV with mod AI, mildly dilated ascending aorta, PASP .  Marland Kitchen Cerebral aneurysm   . Chronic diarrhea   . Fibromyalgia   . Gallstones   . GERD (gastroesophageal reflux disease)   . Heart murmur   . Hyperlipidemia   . Hypertension   . Pancreatitis    2 previous episodes, last one 2013  . PVC's (premature ventricular contractions)    a. 10/2013 event monitor: NSR and PVCs.  . Tobacco abuse     Past Surgical History: Past Surgical History:  Procedure Laterality Date  . ABDOMINAL HYSTERECTOMY    . APPENDECTOMY    . CHOLECYSTECTOMY      Social History: Social History   Social History  . Marital status: Single    Spouse name: N/A  . Number of children: 2  . Years of education: N/A  Occupational History  .  [employer]. [employer]    Dental office   Social History Main Topics  . Smoking status: Current Every Day Smoker    Packs/day: 1.00    Years: 42.00    Types: Cigarettes  . Smokeless tobacco: Never Used  . Alcohol use 0.0 oz/week     Comment: twice per week - 2-6 beers on each occasion  . Drug use: No  . Sexual activity: Yes   Other Topics Concern  . Not on file   Social History Narrative   Single mother   Daughter and son, about to adopt a foster child in upcoming 2017   Dental hygienist    Family History: Family History  Problem Relation Age of Onset  . CAD Mother     Died at 7273, several blockages but patient unknown when this started  . Aneurysm Mother     Brain aneurysm - died of this during surgery  . Hypertension  Mother   . Hyperlipidemia Mother   . Thyroid disease Mother   . Heart disease Father     Died of MI at age 54 - enlarged heart  . Heart attack Father   . Early death Father   . Diabetes Paternal Grandmother   . Esophageal cancer Cousin   . Ulcerative colitis Daughter   . Diabetes Daughter   . Diabetes Son     Allergies: Allergies  Allergen Reactions  . Crestor [Rosuvastatin]     Mood changes  . Pravastatin     Muscle aches, elevated LFTs    Social History: History  Drug Use No  ,  History  Alcohol Use  . 0.0 oz/week    Comment: twice per week - 2-6 beers on each occasion  ,  History  Smoking Status  . Current Every Day Smoker  . Packs/day: 1.00  . Years: 42.00  . Types: Cigarettes  Smokeless Tobacco  . Never Used  ,  History  Sexual Activity  . Sexual activity: Yes    Outpatient Medications reportedly taken by patient at start of today's OV:  No outpatient prescriptions have been marked as taking for the 11/28/15 encounter (Appointment) with Thomasene Loteborah Gayathri Futrell, DO.    ROS  Objective:  There were no vitals taken for this visit. There is no height or weight on file to calculate BMI.  General: Well Developed, well nourished, and in no acute distress.  HEENT: Normocephalic, atraumatic Skin: Warm and dry, cap RF less 2 sec, good turgor Chest:  Normal excursion, shape, no gross abn Respiratory: speaking in full sentences, no conversational dyspnea NeuroM-Sk: Ambulates w/o assistance, moves * 4 Psych: A and O *3    Impression and Recommendations:   No diagnosis found.  1.   No orders of the defined types were placed in this encounter.   The patient was counseled, risk factors were discussed, anticipatory guidance given.  Gross side effects, risk and benefits, and alternatives of medications discussed with patient.  Patient is aware that all medications have potential side effects and we are unable to predict every side effect or drug-drug interaction  that may occur.  Expresses verbal understanding and consents to current therapy plan and treatment regimen.  New Prescriptions   No medications on file    Modified Medications   No medications on file    Discontinued Medications   No medications on file     Please see AVS handed out to patient at the end of our visit for further patient  instructions/ counseling done pertaining to today's office visit.    No Follow-up on file.  Note: This document was prepared using Dragon voice recognition software and may include unintentional dictation errors.

## 2015-12-03 ENCOUNTER — Other Ambulatory Visit: Payer: Self-pay

## 2015-12-03 MED ORDER — VITAMIN D (ERGOCALCIFEROL) 1.25 MG (50000 UNIT) PO CAPS: 50000.0000 [IU] | ORAL_CAPSULE | ORAL | 4 refills | Status: DC

## 2015-12-26 ENCOUNTER — Ambulatory Visit (INDEPENDENT_AMBULATORY_CARE_PROVIDER_SITE_OTHER): Payer: Self-pay | Admitting: Family Medicine

## 2015-12-26 ENCOUNTER — Encounter: Payer: Self-pay | Admitting: Family Medicine

## 2015-12-26 VITALS — BP 100/54 | HR 80 | Ht 64.0 in | Wt 163.6 lb

## 2015-12-26 DIAGNOSIS — I10 Essential (primary) hypertension: Secondary | ICD-10-CM

## 2015-12-26 DIAGNOSIS — I351 Nonrheumatic aortic (valve) insufficiency: Secondary | ICD-10-CM

## 2015-12-26 DIAGNOSIS — F411 Generalized anxiety disorder: Secondary | ICD-10-CM

## 2015-12-26 DIAGNOSIS — Z23 Encounter for immunization: Secondary | ICD-10-CM

## 2015-12-26 DIAGNOSIS — E782 Mixed hyperlipidemia: Secondary | ICD-10-CM

## 2015-12-26 DIAGNOSIS — Z716 Tobacco abuse counseling: Secondary | ICD-10-CM

## 2015-12-26 DIAGNOSIS — F430 Acute stress reaction: Secondary | ICD-10-CM

## 2015-12-26 DIAGNOSIS — Z87898 Personal history of other specified conditions: Secondary | ICD-10-CM

## 2015-12-26 DIAGNOSIS — E781 Pure hyperglyceridemia: Secondary | ICD-10-CM

## 2015-12-26 DIAGNOSIS — Z720 Tobacco use: Secondary | ICD-10-CM

## 2015-12-26 DIAGNOSIS — E559 Vitamin D deficiency, unspecified: Secondary | ICD-10-CM

## 2015-12-26 DIAGNOSIS — E786 Lipoprotein deficiency: Secondary | ICD-10-CM

## 2015-12-26 NOTE — Assessment & Plan Note (Signed)
Xanax just PRN- use prn only

## 2015-12-26 NOTE — Assessment & Plan Note (Signed)
Well controlled, cont meds; Asx

## 2015-12-26 NOTE — Assessment & Plan Note (Addendum)
Elevated LFT- improved from prior though; abstinence encouraged

## 2015-12-26 NOTE — Progress Notes (Signed)
.      Impression and Recommendations:    1. Hypertriglyceridemia   2. Mixed hyperlipidemia   3. Essential hypertension   4. Tobacco abuse   5. Tobacco abuse counseling   6. Anxiety state   7. Vitamin D deficiency   8. Low HDL (under 40)   9. History of alcohol use disorder   10. Emotional crisis as acute reaction to exceptional (gross) stress   11. Need for prophylactic vaccination and inoculation against influenza   12. Aortic valve insufficiency, etiology of cardiac valve disease unspecified     History of alcohol use disorder Elevated LFT- improved from prior though; abstinence encouraged  Essential hypertension Well controlled, cont meds; Asx  Vitamin D deficiency Wkly and daily supplements  Anxiety state Cont wellbutrin daily  Emotional crisis as acute reaction to exceptional (gross) stress Xanax just PRN- use prn only   Hypertriglyceridemia Lifestyle modifications d/c pt---> handouts given  Tobacco abuse Extensive counseling done- not ready to quit  Hyperlipidemia 10 yr risk indicating need for statin txmnt but pt is intolerant to Statins ( failed crestor, lipitor and pravachol). Takes daily ASA and Niacin and Fish oil- tolerating well.   Will await Cards input in future to see what else, if anything they recommend.  Tobacco abuse counseling Counseled pt  Aortic insufficiency F/up cards per their recs   New Prescriptions   No medications on file    Modified Medications   No medications on file    Discontinued Medications   No medications on file    The patient was counseled, risk factors were discussed, anticipatory guidance given.  Gross side effects, risk and benefits, and alternatives of medications and treatment plan in general discussed with patient.  Patient is aware that all medications have potential side effects and we are unable to predict every side effect or drug-drug interaction that may occur.   Patient will call with any  questions prior to using medication if they have concerns.  Expresses verbal understanding and consents to current therapy and treatment regimen.  No barriers to understanding were identified.  Red flag symptoms and signs discussed in detail.  Patient expressed understanding regarding what to do in case of emergency\urgent symptoms  Return in about 6 months (around 06/24/2016).  Please see AVS handed out to patient at the end of our visit for further patient instructions/ counseling done pertaining to today's office visit.    Note: This document was prepared using Dragon voice recognition software and may include unintentional dictation errors.   --------------------------------------------------------------------------------------------------------------------------------------------------------------------------------------------------------------------------------------------    Subjective:    CC:  Chief Complaint  Patient presents with  . Anxiety    HPI: Mary Wade is a 55 y.o. female who presents to [place] at [facility] today for issues as discussed below.  - Here to review labs and to see how she is doing on new meds,  All recent blood work that we ordered was reviewed with patient today.  Patient was counseled on all abnormalities and we discussed dietary and lifestyle changes that could help those values (also medications when appropriate).  Extensive health counseling performed and all patient's concerns/ questions were addressed.    HyperTG:  Tolerating niacin well that I started pt on post-labs.   See lab result notes for details.     Fish oil also-  Less than 1 months now she has been on two of them.   Excess worry and anxiety - much better since starting wellbutrin  last OV.  Only used 3-4 xanax tabs since last seen.     Hasn't thought of quitting tob yet and BF - she lives with still smoking and they both need to quit together.     Wt Readings  from Last 3 Encounters:  12/26/15 163 lb 9.6 oz (74.2 kg)  10/31/15 163 lb 11.2 oz (74.3 kg)  05/23/15 165 lb (74.8 kg)   BP Readings from Last 3 Encounters:  12/26/15 (!) 100/54  10/31/15 113/76  05/23/15 118/76   Pulse Readings from Last 3 Encounters:  12/26/15 80  10/31/15 68  05/23/15 96   BMI Readings from Last 3 Encounters:  12/26/15 28.08 kg/m  10/31/15 28.10 kg/m  05/23/15 27.46 kg/m     Patient Care Team    Relationship Specialty Notifications Start End  Mellody Dance, DO PCP - General Family Medicine  11/19/15   Lelon Perla, MD Consulting Physician Cardiology  10/31/15   Roseanne Kaufman, MD Consulting Physician Orthopedic Surgery  10/31/15     Patient Active Problem List   Diagnosis Date Noted  . Hypertriglyceridemia 12/26/2015    Priority: High  . History of alcohol use disorder 12/26/2015    Priority: High  . Anxiety state 11/01/2015    Priority: High  . Emotional crisis as acute reaction to exceptional (gross) stress 11/01/2015    Priority: High  . Hyperlipidemia 09/28/2013    Priority: High  . Essential hypertension 09/28/2013    Priority: High  . Vitamin D deficiency 12/26/2015    Priority: Medium  . Aortic insufficiency 11/01/2015    Priority: Medium  . GERD (gastroesophageal reflux disease) 10/31/2015    Priority: Medium  . Gallstone Pancreatitis 10/31/2015    Priority: Medium  . Tobacco abuse counseling 10/31/2015    Priority: Medium  . Tobacco abuse 09/28/2013    Priority: Medium  . h/o PVC's (premature ventricular contractions) 11/01/2015    Priority: Low  . dx in past w/ Fibromyalgia 11/01/2015    Priority: Low  . Diarrhea- 2nd to GB removal 10/31/2015    Priority: Low  . h/o Chest pains 10/17/2014    Priority: Low  . Low HDL (under 40) 12/26/2015  . Neuropathy ([place]) 05/23/2015  . Arthritis of hand 05/23/2015  . Cerebral aneurysm 01/24/2015  . Right carotid bruit 10/17/2014  . Palpitations 09/28/2013  . Bicuspid aortic  valve 09/28/2013    Past Medical history, Surgical history, Family history, Social history, Allergies and Medications have been entered into the medical record, reviewed and changed as needed.   Allergies:  Allergies  Allergen Reactions  . Crestor [Rosuvastatin]     Mood changes  . Pravastatin     Muscle aches, elevated LFTs    Review of Systems  Constitutional: Negative.  Negative for chills, diaphoresis, fever, malaise/fatigue and weight loss.  HENT: Negative.  Negative for congestion, sore throat and tinnitus.   Eyes: Negative.  Negative for blurred vision, double vision and photophobia.  Respiratory: Negative.  Negative for cough and wheezing.   Cardiovascular: Negative.  Negative for chest pain and palpitations.  Gastrointestinal: Negative.  Negative for blood in stool, diarrhea, nausea and vomiting.  Genitourinary: Negative.  Negative for dysuria, frequency and urgency.  Musculoskeletal: Negative.  Negative for joint pain and myalgias.  Skin: Negative.  Negative for itching and rash.  Neurological: Negative.  Negative for dizziness, focal weakness, weakness and headaches.  Endo/Heme/Allergies: Negative.  Negative for environmental allergies and polydipsia. Does not bruise/bleed easily.  Psychiatric/Behavioral: Negative.  Negative for  depression and memory loss. The patient is not nervous/anxious and does not have insomnia.      Objective:   Blood pressure (!) 100/54, pulse 80, height _0  (1.626 m), weight 163 lb 9.6 oz (74.2 kg). Body mass index is 28.08 kg/m. General: Well Developed, well nourished, appropriate for stated age.  Neuro: Alert and oriented x3, extra-ocular muscles intact, sensation grossly intact.  HEENT: Normocephalic, atraumatic, neck supple, no bruit Skin: Warm and dry, no gross rash. Cardiac: RRR, S1 S2,  no murmurs rubs or gallops.  Respiratory: ECTA B/L, Not using accessory muscles, speaking in full sentences-unlabored. Vascular:  No gross lower  ext edema, cap RF less 2 sec. Psych: No HI/SI, judgement and insight good, Euthymic mood. Full Affect.  Recent Results (from the past 2160 hour(s))  Lipid Profile     Status: Abnormal   Collection Time: 11/19/15  7:56 AM  Result Value Ref Range   Cholesterol 241 (H) 125 - 200 mg/dL   Triglycerides 472 (H) <150 mg/dL   HDL 37 (L) >=46 mg/dL   Total CHOL/HDL Ratio 6.5 (H) <=5.0 Ratio   VLDL NOT CALC <30 mg/dL    Comment:   Not calculated due to Triglyceride >400. Suggest ordering Direct LDL (Unit Code: [phone]).    LDL Cholesterol NOT CALC <130 mg/dL    Comment:   Not calculated due to Triglyceride >400. Suggest ordering Direct LDL (Unit Code: [phone]).   Total Cholesterol/HDL Ratio:CHD Risk                        Coronary Heart Disease Risk Table                                        Men       Women          1/2 Average Risk              3.4        3.3              Average Risk              5.0        4.4           2X Average Risk              9.6        7.1           3X Average Risk             23.4       11.0 Use the calculated Patient Ratio above and the CHD Risk table  to determine the patient's CHD Risk.   COMPLETE METABOLIC PANEL WITH GFR     Status: Abnormal   Collection Time: 11/19/15  7:56 AM  Result Value Ref Range   Sodium 140 135 - 146 mmol/L   Potassium 4.4 3.5 - 5.3 mmol/L   Chloride 104 98 - 110 mmol/L   CO2 27 20 - 31 mmol/L   Glucose, Bld 110 (H) 65 - 99 mg/dL   BUN 14 7 - 25 mg/dL   Creat 0.62 0.50 - 1.05 mg/dL    Comment:   For patients > or = 54 years of age: The upper reference limit for Creatinine is approximately 13% higher for people identified as African-American.  Total Bilirubin 0.5 0.2 - 1.2 mg/dL   Alkaline Phosphatase 64 33 - 130 U/L   AST 32 10 - 35 U/L   ALT 33 (H) 6 - 29 U/L   Total Protein 6.7 6.1 - 8.1 g/dL   Albumin 4.3 3.6 - 5.1 g/dL   Calcium 9.2 8.6 - 10.4 mg/dL   GFR, Est African American >89 >=60 mL/min   GFR, Est Non  African American >89 >=60 mL/min  CBC w/Diff     Status: None   Collection Time: 11/19/15  7:56 AM  Result Value Ref Range   WBC 9.3 3.8 - 10.8 K/uL   RBC 4.32 3.80 - 5.10 MIL/uL   Hemoglobin 12.8 11.7 - 15.5 g/dL   HCT 38.9 35.0 - 45.0 %   MCV 90.0 80.0 - 100.0 fL   MCH 29.6 27.0 - 33.0 pg   MCHC 32.9 32.0 - 36.0 g/dL   RDW 12.9 11.0 - 15.0 %   Platelets 283 140 - 400 K/uL   MPV 9.3 7.5 - 12.5 fL   Neutro Abs 5,673 1,500 - 7,800 cells/uL   Lymphs Abs 2,976 850 - 3,900 cells/uL   Monocytes Absolute 372 200 - 950 cells/uL   Eosinophils Absolute 279 15 - 500 cells/uL   Basophils Absolute 0 0 - 200 cells/uL   Neutrophils Relative % 61 %   Lymphocytes Relative 32 %   Monocytes Relative 4 %   Eosinophils Relative 3 %   Basophils Relative 0 %   Smear Review Criteria for review not met   TSH     Status: None   Collection Time: 11/19/15  7:56 AM  Result Value Ref Range   TSH 1.52 mIU/L    Comment:   Reference Range   > or = 20 Years  0.40-4.50   Pregnancy Range First trimester  0.26-2.66 Second trimester 0.55-2.73 Third trimester  0.43-2.91     Vitamin D (25 hydroxy)     Status: Abnormal   Collection Time: 11/19/15  7:56 AM  Result Value Ref Range   Vit D, 25-Hydroxy 15 (L) 30 - 100 ng/mL    Comment: Vitamin D Status           25-OH Vitamin D        Deficiency                <20 ng/mL        Insufficiency         20 - 29 ng/mL        Optimal             > or = 30 ng/mL   For 25-OH Vitamin D testing on patients on D2-supplementation and patients for whom quantitation of D2 and D3 fractions is required, the QuestAssureD 25-OH VIT D, (D2,D3), LC/MS/MS is recommended: order code [phone] (patients > 2 yrs).   HgB A1c     Status: Abnormal   Collection Time: 11/19/15  7:56 AM  Result Value Ref Range   Hgb A1c MFr Bld 5.7 (H) <5.7 %    Comment:   For someone without known diabetes, a hemoglobin A1c value between 5.7% and 6.4% is consistent with prediabetes and should be  confirmed with a follow-up test.   For someone with known diabetes, a value <7% indicates that their diabetes is well controlled. A1c targets should be individualized based on duration of diabetes, age, co-morbid conditions and other considerations.   This assay result is consistent with an increased risk  of diabetes.   Currently, no consensus exists regarding use of hemoglobin A1c for diagnosis of diabetes in children.      Mean Plasma Glucose 117 mg/dL

## 2015-12-26 NOTE — Assessment & Plan Note (Signed)
Cont wellbutrin daily

## 2015-12-26 NOTE — Assessment & Plan Note (Signed)
Counseled pt  

## 2015-12-26 NOTE — Assessment & Plan Note (Signed)
Extensive counseling done- not ready to quit

## 2015-12-26 NOTE — Assessment & Plan Note (Signed)
F/up cards per their recs

## 2015-12-26 NOTE — Assessment & Plan Note (Addendum)
>>  ASSESSMENT AND PLAN FOR MIXED HYPERLIPIDEMIA WRITTEN ON 12/26/2015 11:02 PM BY Chyrl Elwell, DO  10 yr risk indicating need for statin txmnt but pt is intolerant to Statins ( failed crestor, lipitor and pravachol). Takes daily ASA and Niacin and Fish oil- tolerating well.   Will await Cards input in future to see what else, if anything they recommend.  >>ASSESSMENT AND PLAN FOR HYPERTRIGLYCERIDEMIA WRITTEN ON 12/26/2015 10:58 PM BY Eryka Dolinger, DO  Lifestyle modifications d/c pt---> handouts given

## 2015-12-26 NOTE — Assessment & Plan Note (Signed)
Wkly and daily supplements

## 2015-12-26 NOTE — Patient Instructions (Addendum)
Reck TG/ chol in 106mo, vit D, A1c,    Lose it  Or My Fitness Pal   For those diagnosed with high triglycerides, it's important to take action to lower your levels and improve your heart health.  Triglyceride is just a fancy word for fat - the fat in our bodies is stored in the form of triglycerides. Triglycerides are found in foods and manufactured in our bodies.  Normal triglyceride levels are defined as less than 150 mg/dL; 132150 to 440199 is considered borderline high; 200 to 499 is high; and 500 or higher is officially called very high. To me, anything over 150 is a red flag indicating my patient needs to take immediate steps to get the situation under control.   What is the significance of high triglycerides? High triglyceride levels make blood thicker and stickier, which means that it is more likely to form clots. Studies have shown that triglyceride levels are associated with increased risks of cardiovascular disease and stroke - in both men and women - alone or in combination with other risk factors (high triglycerides combined with high LDL cholesterol can be a particularly deadly combination). For example, in one ground-breaking study, high triglycerides alone increased the risk of cardiovascular disease by 14 percent in men, and by 37 percent in women. But when the test subjects also had low HDL cholesterol (that's the good cholesterol) and other risk factors, high triglycerides increased the risk of disease by 32 percent in men and 76 percent in women.   Fortunately, triglycerides can sometimes be controlled with several diet and lifestyle changes.    What Factors Can Increase Triglycerides? As with cholesterol, eating too much of the wrong kinds of fats will raise your blood triglycerides.  Therefore, it's important to restrict the amounts of saturated fats and trans fats you allow into your diet.  Triglyceride levels can also shoot up after eating foods that are high in carbohydrates or  after drinking alcohol.  That's why triglyceride blood tests require an overnight fast.  If you have elevated triglycerides, it's especially important to avoid sugary and refined carbohydrates, including sugar, honey, and other sweeteners, soda and other sugary drinks, candy, baked goods, and anything made with white (refined or enriched) flour, including white bread, rolls, cereals, buns, pastries, regular pasta, and white rice.  You'll also want to limit dried fruit and fruit juice since they're dense in simple sugar.  All of these low-quality carbs cause a sudden rise in insulin, which may lead to a spike in triglycerides.  Triglycerides can also become elevated as a reaction to having diabetes, hypothyroidism, or kidney disease. As with most other heart-related factors, being overweight and inactive also contribute to abnormal triglycerides. And unfortunately, some people have a genetic predisposition that causes them to manufacture way too much triglycerides on their own, no matter how carefully they eat.   How Can You Lower Your Triglyceride Levels? If you are diagnosed with high triglycerides, it's important to take action. There are several things you can do to help lower your triglyceride levels and improve your heart health:  Lose weight if you are overweight.  There is a clear correlation between obesity and high triglycerides - the heavier people are, the higher their triglyceride levels are likely to be. The good news is that losing weight can significantly lower triglycerides. In a large study of individuals with type 2 diabetes, those assigned to the "lifestyle intervention group" - which involved counseling, a low-calorie meal plan, and customized  exercise program - lost 8.6% of their body weight and lowered their triglyceride levels by more than 16%. If you're overweight, find a weight loss plan that works for you and commit to shedding the pounds and getting healthier.  Reduce the amount  of saturated fat and trans fat in your diet.  Start by avoiding or dramatically limiting butter, cream cheese, lard, sour cream, doughnuts, cakes, cookies, candy bars, regular ice cream, fried foods, pizza, cheese sauce, cream-based sauces and salad dressings, high-fat meats (including fatty hamburgers, bologna, pepperoni, sausage, bacon, salami, pastrami, spareribs, and hot dogs), high-fat cuts of beef and pork, and whole-milk dairy products.   Other ways to cut back: Choose lean meats only (including skinless chicken and [place], lean beef, lean pork), fish, and reduced-fat or fat-free dairy products.   Experiment with adding whole soy foods to your diet. Although soy itself may not reduce risk of heart disease, it replaces hazardous animal fats with healthier proteins. Choose high-quality soy foods, such as tofu, tempeh, soy milk, and edamame (whole soybeans).  Always remove skin from poultry.  Prepare foods by baking, roasting, broiling, boiling, poaching, steaming, grilling, or stir-frying in vegetable oil.  Most stick margarines contain trans fats, and trans fats are also found in some packaged baked goods, potato chips, snack foods, fried foods, and fast food that use or create hydrogenated oils.    (All food labels must now list the amount of trans fats, right after the amount of saturated fats - good news for consumers. As a result, many food companies have now reformulated their products to be trans fat free.many, but not all! So it's still just as important to read labels and make sure the packaged foods you buy don't contain trans fats.)     If you use margarine, purchase soft-tub margarine spreads that contain 0 grams trans fats and don't list any partially hydrogenated oils in the ingredients list. By substituting olive oil or vegetable oil for trans fats in just 2 percent of your daily calories, you can reduce your risk of heart disease by 53 percent.   There is no safe amount of trans  fats, so try to keep them as far from your plate as possible.  Avoid foods that are concentrated in sugar (even dried fruit and fruit juice). Sugary foods can elevate triglyceride levels in the blood, so keep them to a bare minimum.  Swap out refined carbohydrates for whole grains.  Refined carbohydrates - like white rice, regular pasta, and anything made with white or "enriched" flour (including white bread, rolls, cereals, buns, and crackers) - raise blood sugar and insulin levels more than fiber-rich whole grains. Higher insulin levels, in turn, can lead to a higher rise in triglycerides after a meal. So, make the switch to whole wheat bread, whole grain pasta, brown or wild rice, and whole grain versions of cereals, crackers, and other bread products. However, it's important to know that individuals with high triglycerides should moderate even their intake of high-quality starches (since all starches raise blood sugar) - I recommend 1 to 2 servings per meal.  Cut way back on alcohol.  If you have high triglycerides, alcohol should be considered a rare treat - if you indulge at all, since even small amounts of alcohol can dramatically increase triglyceride levels.  Incorporate omega-3 fats.  Heart-healthy fish oils are especially rich in omega-3 fatty acids. In multiple studies over the past two decades, people who ate diets high in omega-3s had 30 to  40 percent reductions in heart disease. Although we don't yet know why fish oil works so well, there are several possibilities. Omega-3s seem to reduce inflammation, reduce high blood pressure, decrease triglycerides, raise HDL cholesterol, and make blood thinner and less sticky so it is less likely to clot. It's as close to a food prescription for heart health as it gets. If you have high triglycerides, I recommend eating at least three servings of one of the omega-3-rich fish every week (fatty fish is the most concentrated food form of omega three  fats). If you cannot manage to eat that much fish, speak with your physician about taking fish oil capsules, which offer similar benefits.The best foods for omega-3 fatty acids include wild salmon (fresh, canned), herring, mackerel (not king), sardines, anchovies, rainbow trout, and Pacific oysters. Non-fish sources of omega-3 fats include omega-3-fortified eggs, ground flaxseed, chia seeds, walnuts, butternuts (white walnuts), seaweed, walnut oil, canola oil, and soybeans.  Quit smoking.  Smoking causes inflammation, not just in your lungs, but throughout your body. Inflammation can contribute to atherosclerosis, blood clots, and risk of heart attack. Smoking makes all heart health indicators worse. If you have high cholesterol, high triglycerides, or high blood pressure, smoking magnifies the danger.  Become more physically active.  Even moderate exercise can help improve cholesterol, triglycerides, and blood pressure. Aerobic exercise seems to be able to stop the sharp rise of triglycerides after eating, perhaps because of a decrease in the amount of triglyceride released by the liver, or because active muscle clears triglycerides out of the blood stream more quickly than inactive muscle. If you haven't exercised regularly (or at all) for years, I recommend starting slowly, by walking at an easy pace for 15 minutes a day. Then, as you feel more comfortable, increase the amount. Your ultimate goal should be at least 30 minutes of moderate physical activity, at least five days a week.

## 2015-12-26 NOTE — Assessment & Plan Note (Signed)
Lifestyle modifications d/c pt---> handouts given

## 2016-01-19 ENCOUNTER — Telehealth: Payer: Self-pay | Admitting: *Deleted

## 2016-01-19 NOTE — Telephone Encounter (Signed)
Left message for pt to call, she is due for a MRA of the head to follow up on aneurysm after 02-05-16. She also needs to schedule one year follow up appointment after MRA has been completed.

## 2016-02-02 NOTE — Telephone Encounter (Signed)
Spoke with pt, she currently does not have insurance and she is not able to have the MRA or office visit at this time. She is hoping to get things figured out in the new year and will call back for follow up at that time.

## 2016-02-11 ENCOUNTER — Other Ambulatory Visit: Payer: Self-pay

## 2016-02-11 MED ORDER — BUPROPION HCL ER (XL) 150 MG PO TB24: 150.0000 mg | ORAL_TABLET | Freq: Every day | ORAL | 0 refills | Status: DC

## 2016-02-13 ENCOUNTER — Encounter: Payer: Self-pay | Admitting: Family Medicine

## 2016-02-13 ENCOUNTER — Ambulatory Visit (INDEPENDENT_AMBULATORY_CARE_PROVIDER_SITE_OTHER): Payer: Self-pay | Admitting: Family Medicine

## 2016-02-13 VITALS — BP 136/79 | HR 78 | Temp 98.1°F | Ht 64.0 in | Wt 159.8 lb

## 2016-02-13 DIAGNOSIS — Z716 Tobacco abuse counseling: Secondary | ICD-10-CM

## 2016-02-13 DIAGNOSIS — R059 Cough, unspecified: Secondary | ICD-10-CM

## 2016-02-13 DIAGNOSIS — M791 Myalgia, unspecified site: Secondary | ICD-10-CM

## 2016-02-13 DIAGNOSIS — R05 Cough: Secondary | ICD-10-CM

## 2016-02-13 DIAGNOSIS — J019 Acute sinusitis, unspecified: Secondary | ICD-10-CM

## 2016-02-13 LAB — POC INFLUENZA A&B (BINAX/QUICKVUE)
INFLUENZA B, POC: NEGATIVE
Influenza A, POC: NEGATIVE

## 2016-02-13 MED ORDER — HYDROCOD POLST-CPM POLST ER 10-8 MG/5ML PO SUER: 5.0000 mL | Freq: Two times a day (BID) | ORAL | 0 refills | Status: DC | PRN

## 2016-02-13 MED ORDER — PREDNISONE 20 MG PO TABS: ORAL_TABLET | ORAL | 0 refills | Status: DC

## 2016-02-13 MED ORDER — AMOXICILLIN-POT CLAVULANATE 875-125 MG PO TABS
1.0000 | ORAL_TABLET | Freq: Two times a day (BID) | ORAL | 0 refills | Status: AC
Start: 2016-02-13 — End: 2016-02-23

## 2016-02-13 NOTE — Patient Instructions (Signed)
You have a viral infection that should resolve on its own over time, or this could be a flare of seasonal allergies as well.   Symptoms for a viral upper respiratory infection usually last 3-7 days but can stretch out to 2-3 weeks before you're feeling back to normal.  Your symptoms should not worsen after 7-10 days and if they do, please notify our office, as you may need antibiotics.  You can use over-the-counter afrin nasal spray for up to 3 days (NO longer than that) which will help acutely with nasal drainage/congestion short term.   Also, sterile saline nasal rinses, such as Neil med or AYR sinus rinses, can be very helpful and should be done twice daily- even throughout the allergy season.  I would also like you to use an over the counter cold and flu medication such as Tylenol Severe Cold and Sinus or Dayquil/ Nyquil and the like which will help with cough, congestion, pain, and fevers/chills etc.  Unfortunately, antibiotics are not helpful for viral infections.  Wash your hands frequently, as you did not want to get those around you sick as well. Never sneeze or cough on others.  Drink plenty of fluids and stay hydrated, especially if you are running fevers.  We don't know why, but chicken soup also helps, try it! :) 

## 2016-02-13 NOTE — Progress Notes (Signed)
Acute Care office Visit  Assessment and plan:  1. Acute rhinosinusitis   2. Cough   3. Myalgia   4. Tobacco abuse counseling    Anticipatory guidance and routine counseling done re: condition, txmnt options and need for follow up. All questions of patient's were answered. - explained increasing congestion- sinuses and bronchial 2-4 wks after quitting as cilia are "waking up" and things are finally moving again  - Viral vs Allergic vs Bacterial causes for pt's symptoms reveiwed.    - Supportive care and various OTC medications discussed in addition to any prescribed. - Call or RTC if new symptoms, or if no improvement or worse over next couple days.   - Take ABX only at that time if sx continue past 2-3 more days and/or worsening Sx.    Orders Placed This Encounter  Procedures  . POC Influenza A&B (Binax test)    New Prescriptions   AMOXICILLIN-CLAVULANATE (AUGMENTIN) 875-125 MG TABLET    Take 1 tablet by mouth 2 (two) times daily.   CHLORPHENIRAMINE-HYDROCODONE (TUSSIONEX) 10-8 MG/5ML SUER    Take 5 mLs by mouth every 12 (twelve) hours as needed for cough (cough, will cause drowsiness.).   PREDNISONE (DELTASONE) 20 MG TABLET    Take 3 pills a day for 2 days, 2 pills a day for 2 days, 1 pill a day for 2 days then one half pill a day for 2 days then off    Modified Medications   No medications on file    Discontinued Medications   HYDROCODONE-ACETAMINOPHEN (NORCO/VICODIN) 5-325 MG PER TABLET    Take 1 tablet by mouth every 6 (six) hours as needed for moderate pain.     Gross side effects, risk and benefits, and alternatives of medications discussed with patient.  Patient is aware that all medications have potential side effects and we are unable to predict every sideeffect or drug-drug interaction that may occur.  Expresses verbal understanding and consents to current therapy plan and treatment regiment.  Return if symptoms worsen or fail to improve.  Please see AVS handed  out to patient at the end of our visit for additional patient instructions/ counseling done pertaining to today's office visit.  Note: This document was prepared using Dragon voice recognition software and may include unintentional dictation errors.    Subjective:    Chief Complaint  Patient presents with  . URI    HPI:  Acute Sx- acute care OV  1) Quit smoking Dec 8th.  Has had increasing congestion/  RN   Pt presents with URI sx for 3 days.    Inc in sx yesterday.  Head congestion/ face throbs, Blowing crap out her nose- thick greenish- yellow., PND and cough all the time.  Dry hacking mostly- occ prod.   No SOB/ WH/DIB/ CP.  -  Feels wiped out and body aches bad;  subj F/C- but never checked temp.       Tried: Advil, netti pot, airborne, [facility] severe cold and sinus med around the clock.      Overall getting worse.     Patient Active Problem List   Diagnosis Date Noted  . Hypertriglyceridemia 12/26/2015    Priority: High  . History of alcohol use disorder 12/26/2015    Priority: High  . Anxiety state 11/01/2015    Priority: High  . Emotional crisis as acute reaction to exceptional (gross) stress 11/01/2015    Priority: High  . Hyperlipidemia 09/28/2013    Priority: High  .  Essential hypertension 09/28/2013    Priority: High  . Vitamin D deficiency 12/26/2015    Priority: Medium  . Aortic insufficiency 11/01/2015    Priority: Medium  . GERD (gastroesophageal reflux disease) 10/31/2015    Priority: Medium  . Gallstone Pancreatitis 10/31/2015    Priority: Medium  . Tobacco abuse counseling 10/31/2015    Priority: Medium  . Right carotid bruit 10/17/2014    Priority: Medium  . Tobacco abuse 09/28/2013    Priority: Medium  . Low HDL (under 40) 12/26/2015    Priority: Low  . h/o PVC's (premature ventricular contractions) 11/01/2015    Priority: Low  . dx in past w/ Fibromyalgia 11/01/2015    Priority: Low  . Diarrhea- 2nd to GB removal 10/31/2015     Priority: Low  . h/o Chest pains 10/17/2014    Priority: Low  . Neuropathy (HCC) 05/23/2015  . Arthritis of hand 05/23/2015  . Cerebral aneurysm 01/24/2015  . Palpitations 09/28/2013  . Bicuspid aortic valve 09/28/2013    Past medical history, Surgical history, Family history reviewed and noted below, Social history, Allergies, and Medications have been entered into the medical record, reviewed and changed as needed.   Allergies  Allergen Reactions  . Crestor [Rosuvastatin]     Mood changes  . Pravastatin     Muscle aches, elevated LFTs    Review of Systems  Constitutional: Positive for chills and malaise/fatigue. Negative for fever.  HENT: Positive for congestion, ear pain, sinus pain and sore throat. Negative for ear discharge.   Eyes: Positive for pain and discharge.  Respiratory: Positive for cough and sputum production. Negative for shortness of breath, wheezing and stridor.   Cardiovascular: Negative for chest pain.  Gastrointestinal: Negative for diarrhea, nausea and vomiting.       Decreased appetite  Musculoskeletal: Positive for myalgias. Negative for back pain, joint pain and neck pain.  Skin: Negative for rash.  Neurological: Positive for dizziness and headaches.  Endo/Heme/Allergies: Positive for environmental allergies.  Psychiatric/Behavioral: The patient has insomnia.        From coughing    Objective:   Blood pressure 136/79, pulse 78, temperature 98.1 F (36.7 C), temperature source Oral, height 5\' 4"  (1.626 m), weight 159 lb 12.8 oz (72.5 kg). Body mass index is 27.43 kg/m. General: Well Developed, well nourished, appropriate for stated age.  Neuro: Alert and oriented x3, extra-ocular muscles intact, sensation grossly intact.  HEENT: Normocephalic, atraumatic, pupils equal round reactive to light, neck supple, no masses, no painful lymphadenopathy, TM's intact B/L, no acute findings. Nares- patent, clear d/c, OP- clear, mild erythema, No TTP  sinuses Skin: Warm and dry, no gross rash. Cardiac: RRR, S1 S2,  no murmurs rubs or gallops.  Respiratory: ECTA B/L and A/P, Not using accessory muscles, speaking in full sentences- unlabored. Vascular:  No gross lower ext edema, cap RF less 2 sec. Psych: No HI/SI, judgement and insight good, Euthymic mood. Full Affect.   Patient Care Team    Relationship Specialty Notifications Start End  Thomasene Loteborah Corbett Moulder, DO PCP - General Family Medicine  11/19/15   Lewayne BuntingBrian S Crenshaw, MD Consulting Physician Cardiology  10/31/15   Dominica SeverinWilliam Gramig, MD Consulting Physician Orthopedic Surgery  10/31/15

## 2016-02-15 DIAGNOSIS — J019 Acute sinusitis, unspecified: Secondary | ICD-10-CM | POA: Insufficient documentation

## 2016-03-01 ENCOUNTER — Other Ambulatory Visit: Payer: Self-pay

## 2016-03-01 MED ORDER — ALPRAZOLAM 0.5 MG PO TABS: 0.5000 mg | ORAL_TABLET | Freq: Every day | ORAL | 0 refills | Status: DC | PRN

## 2016-03-01 MED ORDER — BUPROPION HCL ER (XL) 300 MG PO TB24: 300.0000 mg | ORAL_TABLET | Freq: Every day | ORAL | 0 refills | Status: DC

## 2016-03-01 NOTE — Progress Notes (Signed)
Per Dr. Sharee Holsterpalski, pt c/o increased anxiety d/t some life circumstances surrounding an adoption and a family member being incarcerated.  Ok to refill Xanax and increased bupropion to 300mg  daily.  Pt advised of refills and to increase her current supply of bupropion 150mg  to 2 tablets daily until finished then start 300mg  tablets one daily.  Pt expressed understanding and is agreeable.  Mary Wade. Karrina Lye, CMA

## 2016-03-15 ENCOUNTER — Telehealth: Payer: Self-pay | Admitting: Family Medicine

## 2016-03-15 MED ORDER — PROMETHAZINE HCL 25 MG PO TABS: 25.0000 mg | ORAL_TABLET | Freq: Four times a day (QID) | ORAL | 1 refills | Status: DC | PRN

## 2016-03-15 NOTE — Telephone Encounter (Signed)
Pt called req'd  PHENERGAN 25MG    [facility] Thanks Dondra SpryGail

## 2016-03-15 NOTE — Addendum Note (Signed)
Addended by: Stan HeadNELSON, TONYA S on: 03/15/2016 09:31 AM   Modules accepted: Orders

## 2016-04-16 ENCOUNTER — Other Ambulatory Visit: Payer: Self-pay | Admitting: Family Medicine

## 2016-06-02 ENCOUNTER — Telehealth: Payer: Self-pay | Admitting: Family Medicine

## 2016-06-02 ENCOUNTER — Other Ambulatory Visit: Payer: Self-pay

## 2016-06-02 DIAGNOSIS — I10 Essential (primary) hypertension: Secondary | ICD-10-CM

## 2016-06-02 MED ORDER — BISOPROLOL-HYDROCHLOROTHIAZIDE 10-6.25 MG PO TABS: ORAL_TABLET | ORAL | 0 refills | Status: DC

## 2016-06-02 NOTE — Telephone Encounter (Signed)
[facility] rep called states pt is there in store for Rx/ bisoprolol HTCZ (ZIAC) 10-6.25 TABLETS taken once daily -

## 2016-07-02 ENCOUNTER — Ambulatory Visit (INDEPENDENT_AMBULATORY_CARE_PROVIDER_SITE_OTHER): Payer: Self-pay | Admitting: Family Medicine

## 2016-07-02 ENCOUNTER — Encounter: Payer: Self-pay | Admitting: Family Medicine

## 2016-07-02 VITALS — BP 112/70 | HR 79 | Ht 64.0 in | Wt 164.0 lb

## 2016-07-02 DIAGNOSIS — Z716 Tobacco abuse counseling: Secondary | ICD-10-CM

## 2016-07-02 DIAGNOSIS — E786 Lipoprotein deficiency: Secondary | ICD-10-CM

## 2016-07-02 DIAGNOSIS — E538 Deficiency of other specified B group vitamins: Secondary | ICD-10-CM | POA: Insufficient documentation

## 2016-07-02 DIAGNOSIS — R7309 Other abnormal glucose: Secondary | ICD-10-CM

## 2016-07-02 DIAGNOSIS — I10 Essential (primary) hypertension: Secondary | ICD-10-CM

## 2016-07-02 DIAGNOSIS — Z1159 Encounter for screening for other viral diseases: Secondary | ICD-10-CM

## 2016-07-02 DIAGNOSIS — F411 Generalized anxiety disorder: Secondary | ICD-10-CM

## 2016-07-02 DIAGNOSIS — E781 Pure hyperglyceridemia: Secondary | ICD-10-CM

## 2016-07-02 DIAGNOSIS — E782 Mixed hyperlipidemia: Secondary | ICD-10-CM

## 2016-07-02 DIAGNOSIS — Z720 Tobacco use: Secondary | ICD-10-CM

## 2016-07-02 DIAGNOSIS — E559 Vitamin D deficiency, unspecified: Secondary | ICD-10-CM

## 2016-07-02 MED ORDER — VITAMIN D (ERGOCALCIFEROL) 1.25 MG (50000 UNIT) PO CAPS: 50000.0000 [IU] | ORAL_CAPSULE | ORAL | 4 refills | Status: DC

## 2016-07-02 MED ORDER — ALPRAZOLAM 0.5 MG PO TABS: 0.5000 mg | ORAL_TABLET | Freq: Every day | ORAL | 0 refills | Status: DC | PRN

## 2016-07-02 MED ORDER — BUPROPION HCL ER (XL) 300 MG PO TB24: 300.0000 mg | ORAL_TABLET | Freq: Every day | ORAL | 1 refills | Status: DC

## 2016-07-02 NOTE — Patient Instructions (Signed)
Pt prefers to be called with resutls    The quick and dirty--> lower triglyceride levels more through...  1) - Beware of bad fats: Cutting back on saturated fat (in red meat and full-fat dairy foods) and trans fats (in restaurant fried foods and commercially prepared baked goods) can lower triglycerides.  2) - Go for good carbs: Easily digested carbohydrates (such as white bread, white rice, cornflakes, and sugary sodas) give triglycerides a definite boost.   3) - Eating whole grains and cutting back on soda can help control triglycerides.  4) - Check your alcohol use. In some people, alcohol dramatically boosts triglycerides. The only way to know if this is true for you is to avoid alcohol for a few weeks and have your triglycerides tested again.  5) - Go fish. Omega-3 fats in salmon, tuna, sardines, and other fatty fish can lower triglycerides. Having fish twice a week is fine.  6) - Aim for a healthy weight. If you are overweight, losing just 5% to 10% of your weight can help drive down triglycerides.  7) - Get moving. Exercise lowers triglycerides and boosts heart-healthy HDL cholesterol.  8) - quit smoking if you do  --> for more information, see below; or go to  www.heart.org  and do a search for desired topics   For those diagnosed with high triglycerides, it's important to take action to lower your levels and improve your heart health.  Triglyceride is just a fancy word for fat - the fat in our bodies is stored in the form of triglycerides. Triglycerides are found in foods and manufactured in our bodies.  Normal triglyceride levels are defined as less than 150 mg/dL; 295 to 621 is considered borderline high; 200 to 499 is high; and 500 or higher is officially called very high. To me, anything over 150 is a red flag indicating my patient needs to take immediate steps to get the situation under control.   What is the significance of high triglycerides? High triglyceride levels  make blood thicker and stickier, which means that it is more likely to form clots. Studies have shown that triglyceride levels are associated with increased risks of cardiovascular disease and stroke - in both men and women - alone or in combination with other risk factors (high triglycerides combined with high LDL cholesterol can be a particularly deadly combination). For example, in one ground-breaking study, high triglycerides alone increased the risk of cardiovascular disease by 14 percent in men, and by 37 percent in women. But when the test subjects also had low HDL cholesterol (that's the good cholesterol) and other risk factors, high triglycerides increased the risk of disease by 32 percent in men and 76 percent in women.   Fortunately, triglycerides can sometimes be controlled with several diet and lifestyle changes.    What Factors Can Increase Triglycerides? As with cholesterol, eating too much of the wrong kinds of fats will raise your blood triglycerides.  Therefore, it's important to restrict the amounts of saturated fats and trans fats you allow into your diet.  Triglyceride levels can also shoot up after eating foods that are high in carbohydrates or after drinking alcohol.  That's why triglyceride blood tests require an overnight fast.  If you have elevated triglycerides, it's especially important to avoid sugary and refined carbohydrates, including sugar, honey, and other sweeteners, soda and other sugary drinks, candy, baked goods, and anything made with white (refined or enriched) flour, including white bread, rolls, cereals, buns, pastries, regular pasta,  and white rice.  You'll also want to limit dried fruit and fruit juice since they're dense in simple sugar.  All of these low-quality carbs cause a sudden rise in insulin, which may lead to a spike in triglycerides.  Triglycerides can also become elevated as a reaction to having diabetes, hypothyroidism, or kidney disease. As with most  other heart-related factors, being overweight and inactive also contribute to abnormal triglycerides. And unfortunately, some people have a genetic predisposition that causes them to manufacture way too much triglycerides on their own, no matter how carefully they eat.     How Can You Lower Your Triglyceride Levels? If you are diagnosed with high triglycerides, it's important to take action. There are several things you can do to help lower your triglyceride levels and improve your heart health:  --> Lose weight if you are overweight.  There is a clear correlation between obesity and high triglycerides - the heavier people are, the higher their triglyceride levels are likely to be. The good news is that losing weight can significantly lower triglycerides. In a large study of individuals with type 2 diabetes, those assigned to the "lifestyle intervention group" - which involved counseling, a low-calorie meal plan, and customized exercise program - lost 8.6% of their body weight and lowered their triglyceride levels by more than 16%. If you're overweight, find a weight loss plan that works for you and commit to shedding the pounds and getting healthier.  --> Reduce the amount of saturated fat and trans fat in your diet.  Start by avoiding or dramatically limiting butter, cream cheese, lard, sour cream, doughnuts, cakes, cookies, candy bars, regular ice cream, fried foods, pizza, cheese sauce, cream-based sauces and salad dressings, high-fat meats (including fatty hamburgers, bologna, pepperoni, sausage, bacon, salami, pastrami, spareribs, and hot dogs), high-fat cuts of beef and pork, and whole-milk dairy products.   Other ways to cut back: Choose lean meats only (including skinless chicken and [place], lean beef, lean pork), fish, and reduced-fat or fat-free dairy products.   Experiment with adding whole soy foods to your diet. Although soy itself may not reduce risk of heart disease, it replaces  hazardous animal fats with healthier proteins. Choose high-quality soy foods, such as tofu, tempeh, soy milk, and edamame (whole soybeans).  Always remove skin from poultry.  Prepare foods by baking, roasting, broiling, boiling, poaching, steaming, grilling, or stir-frying in vegetable oil.  Most stick margarines contain trans fats, and trans fats are also found in some packaged baked goods, potato chips, snack foods, fried foods, and fast food that use or create hydrogenated oils.    (All food labels must now list the amount of trans fats, right after the amount of saturated fats - good news for consumers. As a result, many food companies have now reformulated their products to be trans fat free.many, but not all! So it's still just as important to read labels and make sure the packaged foods you buy don't contain trans fats.)     If you use margarine, purchase soft-tub margarine spreads that contain 0 grams trans fats and don't list any partially hydrogenated oils in the ingredients list. By substituting olive oil or vegetable oil for trans fats in just 2 percent of your daily calories, you can reduce your risk of heart disease by 53 percent.   There is no safe amount of trans fats, so try to keep them as far from your plate as possible.  -->  Avoid foods that are concentrated in  sugar (even dried fruit and fruit juice). Sugary foods can elevate triglyceride levels in the blood, so keep them to a bare minimum.  --> Swap out refined carbohydrates for whole grains.  Refined carbohydrates - like white rice, regular pasta, and anything made with white or "enriched" flour (including white bread, rolls, cereals, buns, and crackers) - raise blood sugar and insulin levels more than fiber-rich whole grains. Higher insulin levels, in turn, can lead to a higher rise in triglycerides after a meal. So, make the switch to whole wheat bread, whole grain pasta, brown or wild rice, and whole grain versions of cereals,  crackers, and other bread products. However, it's important to know that individuals with high triglycerides should moderate even their intake of high-quality starches (since all starches raise blood sugar) - I recommend 1 to 2 servings per meal.  --> Cut way back on alcohol.  If you have high triglycerides, alcohol should be considered a rare treat - if you indulge at all, since even small amounts of alcohol can dramatically increase triglyceride levels.  --> Incorporate omega-3 fats.  Heart-healthy fish oils are especially rich in omega-3 fatty acids. In multiple studies over the past two decades, people who ate diets high in omega-3s had 30 to 40 percent reductions in heart disease. Although we don't yet know why fish oil works so well, there are several possibilities. Omega-3s seem to reduce inflammation, reduce high blood pressure, decrease triglycerides, raise HDL cholesterol, and make blood thinner and less sticky so it is less likely to clot. It's as close to a food prescription for heart health as it gets. If you have high triglycerides, I recommend eating at least three servings of one of the omega-3-rich fish every week (fatty fish is the most concentrated food form of omega three fats). If you cannot manage to eat that much fish, speak with your physician about taking fish oil capsules, which offer similar benefits.The best foods for omega-3 fatty acids include wild salmon (fresh, canned), herring, mackerel (not king), sardines, anchovies, rainbow trout, and Pacific oysters. Non-fish sources of omega-3 fats include omega-3-fortified eggs, ground flaxseed, chia seeds, walnuts, butternuts (white walnuts), seaweed, walnut oil, canola oil, and soybeans.  --> Quit smoking.  Smoking causes inflammation, not just in your lungs, but throughout your body. Inflammation can contribute to atherosclerosis, blood clots, and risk of heart attack. Smoking makes all heart health indicators worse. If you have  high cholesterol, high triglycerides, or high blood pressure, smoking magnifies the danger.  --> Become more physically active.  Even moderate exercise can help improve cholesterol, triglycerides, and blood pressure. Aerobic exercise seems to be able to stop the sharp rise of triglycerides after eating, perhaps because of a decrease in the amount of triglyceride released by the liver, or because active muscle clears triglycerides out of the blood stream more quickly than inactive muscle. If you haven't exercised regularly (or at all) for years, I recommend starting slowly, by walking at an easy pace for 15 minutes a day. Then, as you feel more comfortable, increase the amount. Your ultimate goal should be at least 30 minutes of moderate physical activity, at least five days a week.    .Guidelines for a Low Cholesterol, Low Saturated Fat Diet   Fats - Limit total intake of fats and oils. - Avoid butter, stick margarine, shortening, lard, palm and coconut oils. - Limit mayonnaise, salad dressings, gravies and sauces, unless they are homemade with low-fat ingredients. - Limit chocolate. - Choose low-fat  and nonfat products, such as low-fat mayonnaise, low-fat or non-hydrogenated peanut butter, low-fat or fat-free salad dressings and nonfat gravy. - Use vegetable oil, such as canola or olive oil. - Look for margarine that does not contain trans fatty acids. - Use nuts in moderate amounts. - Read ingredient labels carefully to determine both amount and type of fat present in foods. Limit saturated and trans fats! - Avoid high-fat processed and convenience foods.  Meats and Meat Alternatives - Choose fish, chicken, [place] and lean meats. - Use dried beans, peas, lentils and tofu. - Limit egg yolks to three to four per week. - If you eat red meat, limit to no more than three servings per week and choose loin or round cuts. - Avoid fatty meats, such as bacon, sausage, franks, luncheon meats and  ribs. - Avoid all organ meats, including liver.  Dairy - Choose nonfat or low-fat milk, yogurt and cottage cheese. - Most cheeses are high in fat. Choose cheeses made from non-fat milk, such as mozzarella and ricotta cheese. - Choose light or fat-free cream cheese and sour cream. - Avoid cream and sauces made with cream.  Fruits and Vegetables - Eat a wide variety of fruits and vegetables. - Use lemon juice, vinegar or "mist" olive oil on vegetables. - Avoid adding sauces, fat or oil to vegetables.  Breads, Cereals and Grains - Choose whole-grain breads, cereals, pastas and rice. - Avoid high-fat snack foods, such as granola, cookies, pies, pastries, doughnuts and croissants.  Cooking Tips - Avoid deep fried foods. - Trim visible fat off meats and remove skin from poultry before cooking. - Bake, broil, boil, poach or roast poultry, fish and lean meats. - Drain and discard fat that drains out of meat as you cook it. - Add little or no fat to foods. - Use vegetable oil sprays to grease pans for cooking or baking. - Steam vegetables. - Use herbs or no-oil marinades to flavor foods.

## 2016-07-02 NOTE — Progress Notes (Signed)
.      Impression and Recommendations:    1. Anxiety state   2. Tobacco abuse   3. Tobacco abuse counseling   4. Essential hypertension   5. Mixed hyperlipidemia   6. Hypertriglyceridemia   7. Low HDL (under 40)   8. Need for hepatitis C screening test   9. Vitamin D deficiency   10. Vitamin B12 nutritional deficiency   11. Elevated hemoglobin A1c    - obtain labs   - cont meds per below  - encouraged to quit smoking and counsleing done  - Pt was in the office today for 40+ minutes, with over 50% time spent in face to face counseling of patients various medical conditions, treatment plans of those medical conditions including medicine management and lifestyle modification, strategies to improve health and well being; and in coordination of care.  New Prescriptions   No medications on file    Modified Medications   Modified Medication Previous Medication   ALPRAZOLAM (XANAX) 0.5 MG TABLET ALPRAZolam (XANAX) 0.5 MG tablet      Take 1 tablet (0.5 mg total) by mouth daily as needed for anxiety.    Take 1 tablet (0.5 mg total) by mouth daily as needed for anxiety.   BUPROPION (WELLBUTRIN XL) 300 MG 24 HR TABLET buPROPion (WELLBUTRIN XL) 300 MG 24 hr tablet      Take 1 tablet (300 mg total) by mouth daily.    Take 1 tablet (300 mg total) by mouth daily.   VITAMIN D, ERGOCALCIFEROL, (DRISDOL) 50000 UNITS CAPS CAPSULE Vitamin D, Ergocalciferol, (DRISDOL) 50000 units CAPS capsule      Take 1 capsule (50,000 Units total) by mouth every 7 (seven) days.    Take 1 capsule (50,000 Units total) by mouth every 7 (seven) days.    Discontinued Medications   CHLORPHENIRAMINE-HYDROCODONE (TUSSIONEX) 10-8 MG/5ML SUER    Take 5 mLs by mouth every 12 (twelve) hours as needed for cough (cough, will cause drowsiness.).   NIACIN 50 MG TABLET    Take 100 mg by mouth at bedtime.    OMEGA-3 FATTY ACIDS (FISH OIL) 1200 MG CAPS    Take 3,600 mg by mouth.   PREDNISONE (DELTASONE) 20 MG TABLET    Take 3  pills a day for 2 days, 2 pills a day for 2 days, 1 pill a day for 2 days then one half pill a day for 2 days then off    The patient was counseled, risk factors were discussed, anticipatory guidance given.  Gross side effects, risk and benefits, and alternatives of medications and treatment plan in general discussed with patient.  Patient is aware that all medications have potential side effects and we are unable to predict every side effect or drug-drug interaction that may occur.   Patient will call with any questions prior to using medication if they have concerns.  Expresses verbal understanding and consents to current therapy and treatment regimen.  No barriers to understanding were identified.  Red flag symptoms and signs discussed in detail.  Patient expressed understanding regarding what to do in case of emergency\urgent symptoms  Return in about 3 months (around 10/02/2016).  Please see AVS handed out to patient at the end of our visit for further patient instructions/ counseling done pertaining to today's office visit.    Note: This document was prepared using Dragon voice recognition software and may include unintentional dictation errors.   --------------------------------------------------------------------------------------------------------------------------------------------------------------------------------------------------------------------------------------------    Subjective:    CC:  Chief Complaint  Patient presents with  . Hyperlipidemia  . Hypertension  . Anxiety    HPI: Mary Wade is a 56 y.o. female who presents to [facility] at [facility] today for issues as discussed below.   Not really working on diet/ , no exercise- back to smoking some--> been more stressed  HTN:   Doing well, tol meds well. No Sx  HyperTG:  Was encourageed to take fish oil and niacin in the past.   Excess worry and anxiety -    Trouble sleeping at night b/c  foster child went to live with his brother's family.    Panic attacks and scared at ngiht.    ( much better since starting wellbutrin last OV.  Only usees xanax when panic/ really stressed at night and afraid to fall asleep.)     SMoking:    A few a day- 5 per day.  BF is not smoking- they both quit together- but pt sneaks some.   May marry BF who lives with her, may be selling house to buy new one together  vitmin D:  Feel sso much better on it- more enrgy-  skin is better. etc   Wt Readings from Last 3 Encounters:  07/02/16 164 lb (74.4 kg)  02/13/16 159 lb 12.8 oz (72.5 kg)  12/26/15 163 lb 9.6 oz (74.2 kg)   BP Readings from Last 3 Encounters:  07/02/16 112/70  02/13/16 136/79  12/26/15 (!) 100/54   Pulse Readings from Last 3 Encounters:  07/02/16 79  02/13/16 78  12/26/15 80   BMI Readings from Last 3 Encounters:  07/02/16 28.15 kg/m  02/13/16 27.43 kg/m  12/26/15 28.08 kg/m     Patient Care Team    Relationship Specialty Notifications Start End  Thomasene Lot, DO PCP - General Family Medicine  11/19/15   Lewayne Bunting, MD Consulting Physician Cardiology  10/31/15   Dominica Severin, MD Consulting Physician Orthopedic Surgery  10/31/15     Patient Active Problem List   Diagnosis Date Noted  . Hypertriglyceridemia 12/26/2015    Priority: High  . History of alcohol use disorder 12/26/2015    Priority: High  . Anxiety state 11/01/2015    Priority: High  . Emotional crisis as acute reaction to exceptional (gross) stress 11/01/2015    Priority: High  . Hyperlipidemia 09/28/2013    Priority: High  . Essential hypertension 09/28/2013    Priority: High  . Vitamin D deficiency 12/26/2015    Priority: Medium  . Aortic insufficiency 11/01/2015    Priority: Medium  . GERD (gastroesophageal reflux disease) 10/31/2015    Priority: Medium  . Gallstone Pancreatitis 10/31/2015    Priority: Medium  . Tobacco abuse counseling 10/31/2015    Priority: Medium  . Right  carotid bruit 10/17/2014    Priority: Medium  . Tobacco abuse 09/28/2013    Priority: Medium  . Low HDL (under 40) 12/26/2015    Priority: Low  . h/o PVC's (premature ventricular contractions) 11/01/2015    Priority: Low  . dx in past w/ Fibromyalgia 11/01/2015    Priority: Low  . Diarrhea- 2nd to GB removal 10/31/2015    Priority: Low  . h/o Chest pains 10/17/2014    Priority: Low  . Vitamin B12 nutritional deficiency 07/02/2016  . Neuropathy 05/23/2015  . Arthritis of hand 05/23/2015  . Cerebral aneurysm 01/24/2015  . Palpitations 09/28/2013  . Bicuspid aortic valve 09/28/2013    Past Medical history, Surgical history, Family history, Social  history, Allergies and Medications have been entered into the medical record, reviewed and changed as needed.   Allergies:  Allergies  Allergen Reactions  . Crestor [Rosuvastatin]     Mood changes  . Pravastatin     Muscle aches, elevated LFTs    Review of Systems  Constitutional: Negative.  Negative for chills, diaphoresis, fever, malaise/fatigue and weight loss.  HENT: Negative.  Negative for congestion, sore throat and tinnitus.   Eyes: Negative.  Negative for blurred vision, double vision and photophobia.  Respiratory: Negative.  Negative for cough and wheezing.   Cardiovascular: Negative.  Negative for chest pain and palpitations.  Gastrointestinal: Negative.  Negative for blood in stool, diarrhea, nausea and vomiting.  Genitourinary: Negative.  Negative for dysuria, frequency and urgency.  Musculoskeletal: Negative.  Negative for joint pain and myalgias.  Skin: Negative.  Negative for itching and rash.  Neurological: Negative.  Negative for dizziness, focal weakness, weakness and headaches.  Endo/Heme/Allergies: Negative.  Negative for environmental allergies and polydipsia. Does not bruise/bleed easily.  Psychiatric/Behavioral: Negative.  Negative for depression and memory loss. The patient is not nervous/anxious and does not  have insomnia.      Objective:   Blood pressure 112/70, pulse 79, height 5\' 4"  (1.626 m), weight 164 lb (74.4 kg). Body mass index is 28.15 kg/m. General: Well Developed, well nourished, appropriate for stated age.  Neuro: Alert and oriented x3, extra-ocular muscles intact, sensation grossly intact.  HEENT: Normocephalic, atraumatic, neck supple, no bruit Skin: Warm and dry, no gross rash. Cardiac: RRR, S1 S2,  no murmurs rubs or gallops.  Respiratory: ECTA B/L, Not using accessory muscles, speaking in full sentences-unlabored. Vascular:  No gross lower ext edema, cap RF less 2 sec. Psych: No HI/SI, judgement and insight good, Euthymic mood. Full Affect.  No results found for this or any previous visit (from the past 2160 hour(s)).

## 2016-07-03 LAB — VITAMIN D 25 HYDROXY (VIT D DEFICIENCY, FRACTURES): Vit D, 25-Hydroxy: 50.6 ng/mL (ref 30.0–100.0)

## 2016-07-03 LAB — LIPID PANEL
Chol/HDL Ratio: 6 ratio — ABNORMAL HIGH (ref 0.0–4.4)
Cholesterol, Total: 276 mg/dL — ABNORMAL HIGH (ref 100–199)
HDL: 46 mg/dL (ref >39)
LDL CALC: 153 mg/dL — AB (ref 0–99)
Triglycerides: 385 mg/dL — ABNORMAL HIGH (ref 0–149)
VLDL CHOLESTEROL CAL: 77 mg/dL — AB (ref 5–40)

## 2016-07-03 LAB — COMPREHENSIVE METABOLIC PANEL
A/G RATIO: 2 (ref 1.2–2.2)
ALBUMIN: 4.7 g/dL (ref 3.5–5.5)
ALT: 29 IU/L (ref 0–32)
AST: 26 IU/L (ref 0–40)
Alkaline Phosphatase: 67 IU/L (ref 39–117)
BUN/Creatinine Ratio: 30 — ABNORMAL HIGH (ref 9–23)
BUN: 14 mg/dL (ref 6–24)
Bilirubin Total: 0.2 mg/dL (ref 0.0–1.2)
CALCIUM: 9.5 mg/dL (ref 8.7–10.2)
CO2: 23 mmol/L (ref 18–29)
Chloride: 100 mmol/L (ref 96–106)
Creatinine, Ser: 0.47 mg/dL — ABNORMAL LOW (ref 0.57–1.00)
GFR, EST AFRICAN AMERICAN: 129 mL/min/{1.73_m2} (ref >59)
GFR, EST NON AFRICAN AMERICAN: 112 mL/min/{1.73_m2} (ref >59)
GLOBULIN, TOTAL: 2.4 g/dL (ref 1.5–4.5)
Glucose: 103 mg/dL — ABNORMAL HIGH (ref 65–99)
POTASSIUM: 4.7 mmol/L (ref 3.5–5.2)
Sodium: 140 mmol/L (ref 134–144)
TOTAL PROTEIN: 7.1 g/dL (ref 6.0–8.5)

## 2016-07-03 LAB — HEMOGLOBIN A1C
Est. average glucose Bld gHb Est-mCnc: 114 mg/dL
Hgb A1c MFr Bld: 5.6 % (ref 4.8–5.6)

## 2016-07-03 LAB — TSH: TSH: 0.847 u[IU]/mL (ref 0.450–4.500)

## 2016-07-03 LAB — HEPATITIS C ANTIBODY: Hep C Virus Ab: <0.1 s/co ratio (ref 0.0–0.9)

## 2016-07-03 LAB — VITAMIN B12: VITAMIN B 12: 386 pg/mL (ref 232–1245)

## 2016-07-03 LAB — T4, FREE: Free T4: 0.94 ng/dL (ref 0.82–1.77)

## 2016-07-16 ENCOUNTER — Telehealth: Payer: Self-pay

## 2016-07-16 NOTE — Telephone Encounter (Signed)
Spoke with patient regarding her lab results.  She is unable to access mychart.  Patient informed of results and recommendations.  Advised patient how to check her mychart in the future.

## 2016-09-05 ENCOUNTER — Other Ambulatory Visit: Payer: Self-pay | Admitting: Adult Health

## 2016-09-05 DIAGNOSIS — I10 Essential (primary) hypertension: Secondary | ICD-10-CM

## 2016-09-06 ENCOUNTER — Telehealth: Payer: Self-pay | Admitting: Family Medicine

## 2016-09-06 NOTE — Telephone Encounter (Signed)
Pt called request Dr "Val Eagle'-- advised she is OOO till tomorrow --Patient request provider call her regarding a medical recommendation-- no further reason given.  ---glh

## 2016-09-07 NOTE — Telephone Encounter (Signed)
Spoke to patient - will speak with Dr. Sharee Holsterpalski and call the patient back.  MPulliam, CMA/RT(R)

## 2016-09-07 NOTE — Telephone Encounter (Signed)
I called and spoke with Darl PikesSusan on her cell phone from 12:20-12:30 today about the situation.  Gave pt names & numbers of counselors in the area to call for appts.  Advised to make appt with me if desired.  Carlye Grippe- Deborah J Opalski, D.O. 12:33PM  09/07/16.

## 2016-10-08 ENCOUNTER — Ambulatory Visit (INDEPENDENT_AMBULATORY_CARE_PROVIDER_SITE_OTHER): Payer: Self-pay | Admitting: Family Medicine

## 2016-10-08 ENCOUNTER — Encounter: Payer: Self-pay | Admitting: Family Medicine

## 2016-10-08 VITALS — BP 130/72 | HR 89 | Ht 64.0 in | Wt 157.8 lb

## 2016-10-08 DIAGNOSIS — E782 Mixed hyperlipidemia: Secondary | ICD-10-CM

## 2016-10-08 DIAGNOSIS — Z720 Tobacco use: Secondary | ICD-10-CM

## 2016-10-08 DIAGNOSIS — E781 Pure hyperglyceridemia: Secondary | ICD-10-CM

## 2016-10-08 DIAGNOSIS — F411 Generalized anxiety disorder: Secondary | ICD-10-CM

## 2016-10-08 DIAGNOSIS — K219 Gastro-esophageal reflux disease without esophagitis: Secondary | ICD-10-CM

## 2016-10-08 DIAGNOSIS — R0989 Other specified symptoms and signs involving the circulatory and respiratory systems: Secondary | ICD-10-CM

## 2016-10-08 DIAGNOSIS — E559 Vitamin D deficiency, unspecified: Secondary | ICD-10-CM

## 2016-10-08 DIAGNOSIS — I10 Essential (primary) hypertension: Secondary | ICD-10-CM

## 2016-10-08 MED ORDER — BISOPROLOL-HYDROCHLOROTHIAZIDE 10-6.25 MG PO TABS: 1.0000 | ORAL_TABLET | Freq: Every day | ORAL | 3 refills | Status: DC

## 2016-10-08 MED ORDER — BUPROPION HCL ER (XL) 300 MG PO TB24: 300.0000 mg | ORAL_TABLET | Freq: Every day | ORAL | 1 refills | Status: DC

## 2016-10-08 MED ORDER — VITAMIN D3 125 MCG (5000 UT) PO CAPS: 5000.0000 [IU] | ORAL_CAPSULE | Freq: Every day | ORAL | Status: DC

## 2016-10-08 MED ORDER — NIACIN ER 500 MG PO TBCR: 500.0000 mg | EXTENDED_RELEASE_TABLET | Freq: Every day | ORAL | 3 refills | Status: DC

## 2016-10-08 MED ORDER — VITAMIN D (ERGOCALCIFEROL) 1.25 MG (50000 UNIT) PO CAPS: 50000.0000 [IU] | ORAL_CAPSULE | ORAL | 4 refills | Status: DC

## 2016-10-08 MED ORDER — EZETIMIBE 10 MG PO TABS: 10.0000 mg | ORAL_TABLET | Freq: Every day | ORAL | 3 refills | Status: DC

## 2016-10-08 NOTE — Progress Notes (Signed)
Impression and Recommendations:    1. Essential hypertension   2. Hypertriglyceridemia   3. Anxiety state   4. Mixed hyperlipidemia   5. Right carotid bruit   6. Gastroesophageal reflux disease, esophagitis presence not specified   7. Tobacco abuse   8. Vitamin D insufficiency      No problem-specific Assessment & Plan notes found for this encounter.   The patient was counseled, risk factors were discussed, anticipatory guidance given.   New Prescriptions   EZETIMIBE (ZETIA) 10 MG TABLET    Take 1 tablet (10 mg total) by mouth at bedtime.   NIACIN (SLO-NIACIN) 500 MG TABLET    Take 1 tablet (500 mg total) by mouth at bedtime. Take along with baby aspirin     Discontinued Medications   No medications on file     Modified Medications   Modified Medication Previous Medication   BISOPROLOL-HYDROCHLOROTHIAZIDE (ZIAC) 10-6.25 MG TABLET bisoprolol-hydrochlorothiazide (ZIAC) 10-6.25 MG tablet      Take 1 tablet by mouth daily.    take 1 tablet by mouth daily   BUPROPION (WELLBUTRIN XL) 300 MG 24 HR TABLET buPROPion (WELLBUTRIN XL) 300 MG 24 hr tablet      Take 1 tablet (300 mg total) by mouth daily.    Take 1 tablet (300 mg total) by mouth daily.   CHOLECALCIFEROL (VITAMIN D3) 5000 UNITS CAPS Cholecalciferol (VITAMIN D3) 5000 units CAPS      Take 1 capsule (5,000 Units total) by mouth daily.    Take 5,000 Units by mouth daily.   VITAMIN D, ERGOCALCIFEROL, (DRISDOL) 50000 UNITS CAPS CAPSULE Vitamin D, Ergocalciferol, (DRISDOL) 50000 units CAPS capsule      Take 1 capsule (50,000 Units total) by mouth every 7 (seven) days.    Take 1 capsule (50,000 Units total) by mouth every 7 (seven) days.      No orders of the defined types were placed in this encounter.    Gross side effects, risk and benefits, and alternatives of medications and treatment plan in general discussed with patient.  Patient is aware that all medications have potential side effects and we are unable to  predict every side effect or drug-drug interaction that may occur.   Patient will call with any questions prior to using medication if they have concerns.  Expresses verbal understanding and consents to current therapy and treatment regimen.  No barriers to understanding were identified.  Red flag symptoms and signs discussed in detail.  Patient expressed understanding regarding what to do in case of emergency\urgent symptoms  Please see AVS handed out to patient at the end of our visit for further patient instructions/ counseling done pertaining to today's office visit.   Return for 6 wks- ALT lab only, then OV 4-mo with me, come fasting for FLP.     Note: This document was prepared using Dragon voice recognition software and may include unintentional dictation errors.  Mary Wade 10:16 AM --------------------------------------------------------------------------------------------------------------------------------------------------------------------------------------------------------------------------------------------    Subjective:    CC:  Chief Complaint  Patient presents with  . Follow-up    HPI: Mary Wade is a 56 y.o. female who presents to [facility] at [facility] today for issues as discussed below.   Problem  Hypertriglyceridemia   Mary Wade is a 56 y.o. female who complains of here for SEs from pravastatin 40 mg daily x 2 weeks, feeling achey but ok up until yesterday, urine is dark but drinking lots of water and feeling  better. However she was having HA, generalized muscle pains and also just not feeling well. Prior to being on a statin she was tried a short course ( less than a bottle) of red yeast rice and also Niacin which she took 500 mg of and did not take with ASA and was getting hot falshes and feeling flushed. She stopped taking both the red yeast rice and theniacin on our last visit when I rx her the pravastatin for her elevated  cholesterol.  Trigylcerides were elevated at 633.   She previosuly had a bout of pancreatitis and I think this was due to her hypertriglycedemia   Hyperlipidemia   intolerant to Statins ( failed crestor, lipitor and pravachol) - does ASA and Niacin and Fish oil   Tobacco Abuse   1 ppd- 15 cigs/ day  - started back  Around March but new BF doesn't smoke.       Wt Readings from Last 3 Encounters:  10/08/16 157 lb 12.8 oz (71.6 kg)  07/02/16 164 lb (74.4 kg)  02/13/16 159 lb 12.8 oz (72.5 kg)   BP Readings from Last 3 Encounters:  10/08/16 130/72  07/02/16 112/70  02/13/16 136/79   Pulse Readings from Last 3 Encounters:  10/08/16 89  07/02/16 79  02/13/16 78   BMI Readings from Last 3 Encounters:  10/08/16 27.09 kg/m  07/02/16 28.15 kg/m  02/13/16 27.43 kg/m     Patient Care Team    Relationship Specialty Notifications Start End  Thomasene Lot, DO PCP - General Family Medicine  11/19/15   Lewayne Bunting, MD Consulting Physician Cardiology  10/31/15   Dominica Severin, MD Consulting Physician Orthopedic Surgery  10/31/15      Patient Active Problem List   Diagnosis Date Noted  . Hypertriglyceridemia 12/26/2015    Priority: High  . History of alcohol use disorder 12/26/2015    Priority: High  . Anxiety state 11/01/2015    Priority: High  . Emotional crisis as acute reaction to exceptional (gross) stress 11/01/2015    Priority: High  . Hyperlipidemia 09/28/2013    Priority: High  . Essential hypertension 09/28/2013    Priority: High  . Vitamin D deficiency 12/26/2015    Priority: Medium  . Aortic insufficiency 11/01/2015    Priority: Medium  . GERD (gastroesophageal reflux disease) 10/31/2015    Priority: Medium  . Gallstone Pancreatitis 10/31/2015    Priority: Medium  . Tobacco abuse counseling 10/31/2015    Priority: Medium  . Right carotid bruit 10/17/2014    Priority: Medium  . Tobacco abuse 09/28/2013    Priority: Medium  . Low HDL (under  40) 12/26/2015    Priority: Low  . h/o PVC's (premature ventricular contractions) 11/01/2015    Priority: Low  . dx in past w/ Fibromyalgia 11/01/2015    Priority: Low  . Diarrhea- 2nd to GB removal 10/31/2015    Priority: Low  . h/o Chest pains 10/17/2014    Priority: Low  . Vitamin B12 nutritional deficiency 07/02/2016  . Neuropathy 05/23/2015  . Arthritis of hand 05/23/2015  . Cerebral aneurysm 01/24/2015  . Palpitations 09/28/2013  . Bicuspid aortic valve 09/28/2013    Past Medical history, Surgical history, Family history, Social history, Allergies and Medications have been entered into the medical record, reviewed and changed as needed.    Current Meds  Medication Sig  . ALPRAZolam (XANAX) 0.5 MG tablet Take 1 tablet (0.5 mg total) by mouth daily as needed for anxiety.  . bisoprolol-hydrochlorothiazide [facility])  10-6.25 MG tablet Take 1 tablet by mouth daily.  Marland Kitchen buPROPion (WELLBUTRIN XL) 300 MG 24 hr tablet Take 1 tablet (300 mg total) by mouth daily.  . Cholecalciferol (VITAMIN D3) 5000 units CAPS Take 1 capsule (5,000 Units total) by mouth daily.  Marland Kitchen ibuprofen (ADVIL,MOTRIN) 200 MG tablet Take 200 mg by mouth every 6 (six) hours as needed for mild pain or moderate pain.  Marland Kitchen omeprazole (PRILOSEC) 20 MG capsule Take 2 capsules (40 mg total) by mouth daily. (Patient taking differently: Take 20 mg by mouth daily. )  . promethazine (PHENERGAN) 25 MG tablet Take 1 tablet (25 mg total) by mouth every 6 (six) hours as needed for nausea or vomiting.  . Vitamin D, Ergocalciferol, (DRISDOL) 50000 units CAPS capsule Take 1 capsule (50,000 Units total) by mouth every 7 (seven) days.  . [DISCONTINUED] bisoprolol-hydrochlorothiazide (ZIAC) 10-6.25 MG tablet take 1 tablet by mouth daily  . [DISCONTINUED] buPROPion (WELLBUTRIN XL) 300 MG 24 hr tablet Take 1 tablet (300 mg total) by mouth daily.  . [DISCONTINUED] Cholecalciferol (VITAMIN D3) 5000 units CAPS Take 5,000 Units by mouth daily.  .  [DISCONTINUED] Vitamin D, Ergocalciferol, (DRISDOL) 50000 units CAPS capsule Take 1 capsule (50,000 Units total) by mouth every 7 (seven) days.    Allergies:  Allergies  Allergen Reactions  . Crestor [Rosuvastatin]     Mood changes  . Pravastatin     Muscle aches, elevated LFTs     Review of Systems: General:   Denies fever, chills, unexplained weight loss.  Optho/Auditory:   Denies visual changes, blurred vision/LOV Respiratory:   Denies wheeze, DOE more than baseline levels.  Cardiovascular:   Denies chest pain, palpitations, new onset peripheral edema  Gastrointestinal:   Denies nausea, vomiting, diarrhea, abd pain.  Genitourinary: Denies dysuria, freq/ urgency, flank pain or discharge from genitals.  Endocrine:     Denies hot or cold intolerance, polyuria, polydipsia. Musculoskeletal:   Denies unexplained myalgias, joint swelling, unexplained arthralgias, gait problems.  Skin:  Denies new onset rash, suspicious lesions Neurological:     Denies dizziness, unexplained weakness, numbness  Psychiatric/Behavioral:   Denies mood changes, suicidal or homicidal ideations, hallucinations    Objective:   Blood pressure 130/72, pulse 89, height 5\' 4"  (1.626 m), weight 157 lb 12.8 oz (71.6 kg). Body mass index is 27.09 kg/m. General:  Well Developed, well nourished, appropriate for stated age.  Neuro:  Alert and oriented,  extra-ocular muscles intact  HEENT:  Normocephalic, atraumatic, neck supple, no carotid bruits appreciated  Skin:  no gross rash, warm, pink. Cardiac:  RRR, S1 S2 Respiratory:  ECTA B/L and A/P, Not using accessory muscles, speaking in full sentences- unlabored. Vascular:  Ext warm, no cyanosis apprec.; cap RF less 2 sec. Psych:  No HI/SI, judgement and insight good, Euthymic mood. Full Affect.

## 2016-10-08 NOTE — Patient Instructions (Signed)
6 weeks to come in and recheck ALT since starting cholesterol medicines; recheck cholesterol panel in 4-6 months.  It is extremely important to drink at least half of your weight in ounces of water per day and try to walk and move to get blood flow to your legs at least 30 minutes 4-5 days per week.   Guidelines for a Low Cholesterol, Low Saturated Fat Diet   Fats - Limit total intake of fats and oils. - Avoid butter, stick margarine, shortening, lard, palm and coconut oils. - Limit mayonnaise, salad dressings, gravies and sauces, unless they are homemade with low-fat ingredients. - Limit chocolate. - Choose low-fat and nonfat products, such as low-fat mayonnaise, low-fat or non-hydrogenated peanut butter, low-fat or fat-free salad dressings and nonfat gravy. - Use vegetable oil, such as canola or olive oil. - Look for margarine that does not contain trans fatty acids. - Use nuts in moderate amounts. - Read ingredient labels carefully to determine both amount and type of fat present in foods. Limit saturated and trans fats! - Avoid high-fat processed and convenience foods.  Meats and Meat Alternatives - Choose fish, chicken, [place] and lean meats. - Use dried beans, peas, lentils and tofu. - Limit egg yolks to three to four per week. - If you eat red meat, limit to no more than three servings per week and choose loin or round cuts. - Avoid fatty meats, such as bacon, sausage, franks, luncheon meats and ribs. - Avoid all organ meats, including liver.  Dairy - Choose nonfat or low-fat milk, yogurt and cottage cheese. - Most cheeses are high in fat. Choose cheeses made from non-fat milk, such as mozzarella and ricotta cheese. - Choose light or fat-free cream cheese and sour cream. - Avoid cream and sauces made with cream.  Fruits and Vegetables - Eat a wide variety of fruits and vegetables. - Use lemon juice, vinegar or "mist" olive oil on vegetables. - Avoid adding sauces, fat or  oil to vegetables.  Breads, Cereals and Grains - Choose whole-grain breads, cereals, pastas and rice. - Avoid high-fat snack foods, such as granola, cookies, pies, pastries, doughnuts and croissants.  Cooking Tips - Avoid deep fried foods. - Trim visible fat off meats and remove skin from poultry before cooking. - Bake, broil, boil, poach or roast poultry, fish and lean meats. - Drain and discard fat that drains out of meat as you cook it. - Add little or no fat to foods. - Use vegetable oil sprays to grease pans for cooking or baking. - Steam vegetables. - Use herbs or no-oil marinades to flavor foods.   The quick and dirty--> lower triglyceride levels more through...  1) - Beware of bad fats: Cutting back on saturated fat (in red meat and full-fat dairy foods) and trans fats (in restaurant fried foods and commercially prepared baked goods) can lower triglycerides.  2) - Go for good carbs: Easily digested carbohydrates (such as white bread, white rice, cornflakes, and sugary sodas) give triglycerides a definite boost.   3) - Eating whole grains and cutting back on soda can help control triglycerides.  4) - Check your alcohol use. In some people, alcohol dramatically boosts triglycerides. The only way to know if this is true for you is to avoid alcohol for a few weeks and have your triglycerides tested again.  5) - Go fish. Omega-3 fats in salmon, tuna, sardines, and other fatty fish can lower triglycerides. Having fish twice a week is fine.  6) -  Aim for a healthy weight. If you are overweight, losing just 5% to 10% of your weight can help drive down triglycerides.  7) - Get moving. Exercise lowers triglycerides and boosts heart-healthy HDL cholesterol.  8) - quit smoking if you do  --> for more information, see below; or go to  www.heart.org  and do a search for desired topics   For those diagnosed with high triglycerides, it's important to take action to lower your levels and  improve your heart health.  Triglyceride is just a fancy word for fat - the fat in our bodies is stored in the form of triglycerides. Triglycerides are found in foods and manufactured in our bodies.  Normal triglyceride levels are defined as less than 150 mg/dL; 161 to 096 is considered borderline high; 200 to 499 is high; and 500 or higher is officially called very high. To me, anything over 150 is a red flag indicating my patient needs to take immediate steps to get the situation under control.   What is the significance of high triglycerides? High triglyceride levels make blood thicker and stickier, which means that it is more likely to form clots. Studies have shown that triglyceride levels are associated with increased risks of cardiovascular disease and stroke - in both men and women - alone or in combination with other risk factors (high triglycerides combined with high LDL cholesterol can be a particularly deadly combination). For example, in one ground-breaking study, high triglycerides alone increased the risk of cardiovascular disease by 14 percent in men, and by 37 percent in women. But when the test subjects also had low HDL cholesterol (that's the good cholesterol) and other risk factors, high triglycerides increased the risk of disease by 32 percent in men and 76 percent in women.   Fortunately, triglycerides can sometimes be controlled with several diet and lifestyle changes.    What Factors Can Increase Triglycerides? As with cholesterol, eating too much of the wrong kinds of fats will raise your blood triglycerides.  Therefore, it's important to restrict the amounts of saturated fats and trans fats you allow into your diet.  Triglyceride levels can also shoot up after eating foods that are high in carbohydrates or after drinking alcohol.  That's why triglyceride blood tests require an overnight fast.  If you have elevated triglycerides, it's especially important to avoid sugary and  refined carbohydrates, including sugar, honey, and other sweeteners, soda and other sugary drinks, candy, baked goods, and anything made with white (refined or enriched) flour, including white bread, rolls, cereals, buns, pastries, regular pasta, and white rice.  You'll also want to limit dried fruit and fruit juice since they're dense in simple sugar.  All of these low-quality carbs cause a sudden rise in insulin, which may lead to a spike in triglycerides.  Triglycerides can also become elevated as a reaction to having diabetes, hypothyroidism, or kidney disease. As with most other heart-related factors, being overweight and inactive also contribute to abnormal triglycerides. And unfortunately, some people have a genetic predisposition that causes them to manufacture way too much triglycerides on their own, no matter how carefully they eat.     How Can You Lower Your Triglyceride Levels? If you are diagnosed with high triglycerides, it's important to take action. There are several things you can do to help lower your triglyceride levels and improve your heart health:  --> Lose weight if you are overweight.  There is a clear correlation between obesity and high triglycerides - the heavier  people are, the higher their triglyceride levels are likely to be. The good news is that losing weight can significantly lower triglycerides. In a large study of individuals with type 2 diabetes, those assigned to the "lifestyle intervention group" - which involved counseling, a low-calorie meal plan, and customized exercise program - lost 8.6% of their body weight and lowered their triglyceride levels by more than 16%. If you're overweight, find a weight loss plan that works for you and commit to shedding the pounds and getting healthier.  --> Reduce the amount of saturated fat and trans fat in your diet.  Start by avoiding or dramatically limiting butter, cream cheese, lard, sour cream, doughnuts, cakes, cookies,  candy bars, regular ice cream, fried foods, pizza, cheese sauce, cream-based sauces and salad dressings, high-fat meats (including fatty hamburgers, bologna, pepperoni, sausage, bacon, salami, pastrami, spareribs, and hot dogs), high-fat cuts of beef and pork, and whole-milk dairy products.   Other ways to cut back: Choose lean meats only (including skinless chicken and [place], lean beef, lean pork), fish, and reduced-fat or fat-free dairy products.   Experiment with adding whole soy foods to your diet. Although soy itself may not reduce risk of heart disease, it replaces hazardous animal fats with healthier proteins. Choose high-quality soy foods, such as tofu, tempeh, soy milk, and edamame (whole soybeans).  Always remove skin from poultry.  Prepare foods by baking, roasting, broiling, boiling, poaching, steaming, grilling, or stir-frying in vegetable oil.  Most stick margarines contain trans fats, and trans fats are also found in some packaged baked goods, potato chips, snack foods, fried foods, and fast food that use or create hydrogenated oils.    (All food labels must now list the amount of trans fats, right after the amount of saturated fats - good news for consumers. As a result, many food companies have now reformulated their products to be trans fat free.many, but not all! So it's still just as important to read labels and make sure the packaged foods you buy don't contain trans fats.)     If you use margarine, purchase soft-tub margarine spreads that contain 0 grams trans fats and don't list any partially hydrogenated oils in the ingredients list. By substituting olive oil or vegetable oil for trans fats in just 2 percent of your daily calories, you can reduce your risk of heart disease by 53 percent.   There is no safe amount of trans fats, so try to keep them as far from your plate as possible.  -->  Avoid foods that are concentrated in sugar (even dried fruit and fruit juice). Sugary  foods can elevate triglyceride levels in the blood, so keep them to a bare minimum.  --> Swap out refined carbohydrates for whole grains.  Refined carbohydrates - like white rice, regular pasta, and anything made with white or "enriched" flour (including white bread, rolls, cereals, buns, and crackers) - raise blood sugar and insulin levels more than fiber-rich whole grains. Higher insulin levels, in turn, can lead to a higher rise in triglycerides after a meal. So, make the switch to whole wheat bread, whole grain pasta, brown or wild rice, and whole grain versions of cereals, crackers, and other bread products. However, it's important to know that individuals with high triglycerides should moderate even their intake of high-quality starches (since all starches raise blood sugar) - I recommend 1 to 2 servings per meal.  --> Cut way back on alcohol.  If you have high triglycerides, alcohol should be  considered a rare treat - if you indulge at all, since even small amounts of alcohol can dramatically increase triglyceride levels.  --> Incorporate omega-3 fats.  Heart-healthy fish oils are especially rich in omega-3 fatty acids. In multiple studies over the past two decades, people who ate diets high in omega-3s had 30 to 40 percent reductions in heart disease. Although we don't yet know why fish oil works so well, there are several possibilities. Omega-3s seem to reduce inflammation, reduce high blood pressure, decrease triglycerides, raise HDL cholesterol, and make blood thinner and less sticky so it is less likely to clot. It's as close to a food prescription for heart health as it gets. If you have high triglycerides, I recommend eating at least three servings of one of the omega-3-rich fish every week (fatty fish is the most concentrated food form of omega three fats). If you cannot manage to eat that much fish, speak with your physician about taking fish oil capsules, which offer similar benefits.The  best foods for omega-3 fatty acids include wild salmon (fresh, canned), herring, mackerel (not king), sardines, anchovies, rainbow trout, and Pacific oysters. Non-fish sources of omega-3 fats include omega-3-fortified eggs, ground flaxseed, chia seeds, walnuts, butternuts (white walnuts), seaweed, walnut oil, canola oil, and soybeans.  --> Quit smoking.  Smoking causes inflammation, not just in your lungs, but throughout your body. Inflammation can contribute to atherosclerosis, blood clots, and risk of heart attack. Smoking makes all heart health indicators worse. If you have high cholesterol, high triglycerides, or high blood pressure, smoking magnifies the danger.  --> Become more physically active.  Even moderate exercise can help improve cholesterol, triglycerides, and blood pressure. Aerobic exercise seems to be able to stop the sharp rise of triglycerides after eating, perhaps because of a decrease in the amount of triglyceride released by the liver, or because active muscle clears triglycerides out of the blood stream more quickly than inactive muscle. If you haven't exercised regularly (or at all) for years, I recommend starting slowly, by walking at an easy pace for 15 minutes a day. Then, as you feel more comfortable, increase the amount. Your ultimate goal should be at least 30 minutes of moderate physical activity, at least five days a week.

## 2016-11-15 ENCOUNTER — Other Ambulatory Visit: Payer: Self-pay

## 2016-11-15 DIAGNOSIS — Z87898 Personal history of other specified conditions: Secondary | ICD-10-CM

## 2016-11-19 ENCOUNTER — Other Ambulatory Visit: Payer: Self-pay

## 2017-05-18 ENCOUNTER — Encounter: Payer: Self-pay | Admitting: Adult Health

## 2017-05-18 ENCOUNTER — Ambulatory Visit (INDEPENDENT_AMBULATORY_CARE_PROVIDER_SITE_OTHER): Payer: Self-pay | Admitting: Adult Health

## 2017-05-18 VITALS — BP 106/66 | HR 70 | Temp 98.5°F | Ht 64.0 in | Wt 162.3 lb

## 2017-05-18 DIAGNOSIS — J3489 Other specified disorders of nose and nasal sinuses: Secondary | ICD-10-CM

## 2017-05-18 DIAGNOSIS — J0110 Acute frontal sinusitis, unspecified: Secondary | ICD-10-CM

## 2017-05-18 LAB — POCT INFLUENZA A/B
INFLUENZA A, POC: NEGATIVE
INFLUENZA B, POC: NEGATIVE

## 2017-05-18 MED ORDER — AMOXICILLIN-POT CLAVULANATE 875-125 MG PO TABS: 1.0000 | ORAL_TABLET | Freq: Two times a day (BID) | ORAL | 0 refills | Status: DC

## 2017-05-18 MED ORDER — HYDROCOD POLST-CPM POLST ER 10-8 MG/5ML PO SUER: 5.0000 mL | Freq: Two times a day (BID) | ORAL | 0 refills | Status: DC | PRN

## 2017-05-18 MED ORDER — PREDNISONE 20 MG PO TABS: ORAL_TABLET | ORAL | 0 refills | Status: DC

## 2017-05-18 NOTE — Progress Notes (Addendum)
Subjective:    Patient ID: Mary Wade, female    DOB: 04-Feb-1961, 57 y.o.   MRN: [id number]  HPI:  Mr. Mary Wade presents with copious clear nasal drainage, constant sneezing, sig fatigue, facial pressure and non-productive cough that has steadily been worsening >1.5 weeks. She has had diffuse HA that is intermittent throbbing and rated 6/10. She also reports mild nausea a few days ago that has since subsided. She denies CP/palpitations/fever/night sweats/vomiting/diarrhea. She has been using nasal rinses twice daily and OTC antihistamine. Her employer, a [employer], would like her have a flu test.  Patient Care Team    Relationship Specialty Notifications Start End  Thomasene Lotpalski, Deborah, DO PCP - General Family Medicine  11/19/15   Lewayne Buntingrenshaw, Brian S, MD Consulting Physician Cardiology  10/31/15   Dominica SeverinGramig, William, MD Consulting Physician Orthopedic Surgery  10/31/15     Patient Active Problem List   Diagnosis Date Noted  . Vitamin B12 nutritional deficiency 07/02/2016  . Hypertriglyceridemia 12/26/2015  . Vitamin D deficiency 12/26/2015  . Low HDL (under 40) 12/26/2015  . History of alcohol use disorder 12/26/2015  . h/o PVC's (premature ventricular contractions) 11/01/2015  . dx in past w/ Fibromyalgia 11/01/2015  . Aortic insufficiency 11/01/2015  . Anxiety state 11/01/2015  . Emotional crisis as acute reaction to exceptional (gross) stress 11/01/2015  . GERD (gastroesophageal reflux disease) 10/31/2015  . Gallstone Pancreatitis 10/31/2015  . Diarrhea- 2nd to GB removal 10/31/2015  . Tobacco abuse counseling 10/31/2015  . Neuropathy 05/23/2015  . Arthritis of hand 05/23/2015  . Cerebral aneurysm 01/24/2015  . Right carotid bruit 10/17/2014  . h/o Chest pains 10/17/2014  . Palpitations 09/28/2013  . Bicuspid aortic valve 09/28/2013  . Hyperlipidemia 09/28/2013  . Tobacco abuse 09/28/2013  . Essential hypertension 09/28/2013     Past Medical History:  Diagnosis Date  .  Aortic insufficiency    a. 10/2013: mod AI by echo.  . Bicuspid aortic valve    a. 10/24/13 showed EF 55-60%, no RWMA, bicuspid AV with mod AI, mildly dilated ascending aorta, PASP 32mmHg.  Marland Kitchen. Cerebral aneurysm   . Chronic diarrhea   . Fibromyalgia   . Gallstones   . GERD (gastroesophageal reflux disease)   . Heart murmur   . Hyperlipidemia   . Hypertension   . Pancreatitis    2 previous episodes, last one 2013  . PVC's (premature ventricular contractions)    a. 10/2013 event monitor: NSR and PVCs.  . Tobacco abuse      Past Surgical History:  Procedure Laterality Date  . ABDOMINAL HYSTERECTOMY    . APPENDECTOMY    . CHOLECYSTECTOMY       Family History  Problem Relation Age of Onset  . CAD Mother        Died at 4173, several blockages but patient unknown when this started  . Aneurysm Mother        Brain aneurysm - died of this during surgery  . Hypertension Mother   . Hyperlipidemia Mother   . Thyroid disease Mother   . Heart disease Father        Died of MI at age 56 - enlarged heart  . Heart attack Father   . Early death Father   . Diabetes Paternal Grandmother   . Esophageal cancer Cousin   . Ulcerative colitis Daughter   . Diabetes Daughter   . Diabetes Son      Social History   Substance and Sexual Activity  Drug Use No  Social History   Substance and Sexual Activity  Alcohol Use Yes  . Alcohol/week: 0.0 oz   Comment: twice per week - 2-6 beers on each occasion     Social History   Tobacco Use  Smoking Status Former Smoker  . Packs/day: 1.00  . Years: 42.00  . Pack years: 42.00  . Types: Cigarettes  . Last attempt to quit: 01/23/2016  . Years since quitting: 1.3  Smokeless Tobacco Never Used     Outpatient Encounter Medications as of 05/18/2017  Medication Sig  . ALPRAZolam (XANAX) 0.5 MG tablet Take 1 tablet (0.5 mg total) by mouth daily as needed for anxiety.  . bisoprolol-hydrochlorothiazide (ZIAC) 10-6.25 MG tablet Take 1 tablet by  mouth daily.  Marland Kitchen buPROPion (WELLBUTRIN XL) 300 MG 24 hr tablet Take 1 tablet (300 mg total) by mouth daily.  . Cholecalciferol (VITAMIN D3) 5000 units CAPS Take 1 capsule (5,000 Units total) by mouth daily.  Marland Kitchen ibuprofen (ADVIL,MOTRIN) 200 MG tablet Take 200 mg by mouth every 6 (six) hours as needed for mild pain or moderate pain.  . niacin (SLO-NIACIN) 500 MG tablet Take 1 tablet (500 mg total) by mouth at bedtime. Take along with baby aspirin  . omeprazole (PRILOSEC) 20 MG capsule Take 2 capsules (40 mg total) by mouth daily. (Patient taking differently: Take 20 mg by mouth daily. )  . promethazine (PHENERGAN) 25 MG tablet Take 1 tablet (25 mg total) by mouth every 6 (six) hours as needed for nausea or vomiting.  . Vitamin D, Ergocalciferol, (DRISDOL) 50000 units CAPS capsule Take 1 capsule (50,000 Units total) by mouth every 7 (seven) days.  . [DISCONTINUED] ezetimibe (ZETIA) 10 MG tablet Take 1 tablet (10 mg total) by mouth at bedtime.   No facility-administered encounter medications on file as of 05/18/2017.     Allergies: Crestor [rosuvastatin] and Pravastatin  Body mass index is 27.86 kg/m.  Blood pressure 106/66, pulse 70, temperature 98.5 F (36.9 C), temperature source Oral, height 5\' 4"  (1.626 m), weight 162 lb 4.8 oz (73.6 kg), SpO2 95 %.    Review of Systems  Constitutional: Positive for activity change, appetite change, diaphoresis and fatigue. Negative for chills, fever and unexpected weight change.  HENT: Positive for congestion, postnasal drip, rhinorrhea, sinus pressure, sinus pain, sneezing and voice change. Negative for sore throat.   Eyes: Negative for visual disturbance.  Respiratory: Positive for cough. Negative for chest tightness, shortness of breath, wheezing and stridor.   Cardiovascular: Negative for chest pain, palpitations and leg swelling.  Gastrointestinal: Negative for abdominal distention, abdominal pain, blood in stool, constipation, diarrhea, nausea and  vomiting.  Neurological: Positive for headaches. Negative for dizziness.  Hematological: Does not bruise/bleed easily.  Psychiatric/Behavioral: Positive for sleep disturbance.       Objective:   Physical Exam  Constitutional: She is oriented to person, place, and time. She appears well-developed and well-nourished. She has a sickly appearance. She appears ill.  HENT:  Head: Normocephalic and atraumatic.  Right Ear: External ear normal. Tympanic membrane is bulging. Tympanic membrane is not erythematous. No decreased hearing is noted.  Left Ear: External ear normal. Tympanic membrane is bulging. Tympanic membrane is not erythematous. No decreased hearing is noted.  Nose: Mucosal edema and rhinorrhea present. Right sinus exhibits frontal sinus tenderness. Right sinus exhibits no maxillary sinus tenderness. Left sinus exhibits frontal sinus tenderness. Left sinus exhibits no maxillary sinus tenderness.  Mouth/Throat: Uvula is midline and mucous membranes are normal. Posterior oropharyngeal edema and posterior oropharyngeal  erythema present. No oropharyngeal exudate or tonsillar abscesses.  Eyes: Pupils are equal, round, and reactive to light. Conjunctivae are normal.  Neck: Normal range of motion. Neck supple.  Cardiovascular: Normal rate, regular rhythm, normal heart sounds and intact distal pulses.  No murmur heard. Pulmonary/Chest: Effort normal and breath sounds normal. No respiratory distress. She has no wheezes. She has no rales. She exhibits no tenderness.  Lymphadenopathy:    She has no cervical adenopathy.  Neurological: She is alert and oriented to person, place, and time.  Skin: Skin is warm and dry. No rash noted. No erythema. No pallor.  Psychiatric: She has a normal mood and affect. Her behavior is normal. Judgment and thought content normal.  Nursing note and vitals reviewed.      Assessment & Plan:   1. Nasal drainage   2. Acute frontal sinusitis, recurrence not  specified     Acute frontal sinusitis Flu test- neg Please take Augmentin,Prednisone as directed.  Tussionex and Flonase (OTC) as needed. [place] Controlled Substance Database reviewed- no abberancies noted. Increase fluids/rest/vit c-2,000mg /day. Alternate OTC Acetaminophen and Ibuprofen as needed for discomfort. Work excuse provided, okay to return 05/23/17. If symptoms persist after antibiotic completed, please call clinic.    FOLLOW-UP:  Return if symptoms worsen or fail to improve.

## 2017-05-18 NOTE — Assessment & Plan Note (Signed)
Flu test- neg Please take Augmentin,Prednisone as directed.  Tussionex and Flonase (OTC) as needed. [place] Controlled Substance Database reviewed- no abberancies noted. Increase fluids/rest/vit c-2,000mg /day. Alternate OTC Acetaminophen and Ibuprofen as needed for discomfort. Work excuse provided, okay to return 05/23/17. If symptoms persist after antibiotic completed, please call clinic.

## 2017-05-18 NOTE — Patient Instructions (Addendum)

## 2017-06-08 ENCOUNTER — Other Ambulatory Visit: Payer: Self-pay

## 2017-06-08 DIAGNOSIS — F411 Generalized anxiety disorder: Secondary | ICD-10-CM

## 2017-06-08 NOTE — Telephone Encounter (Signed)
Pharmacy sent refill request for Alprazolam, forwarded to Dr. Sharee Holsterpalski.  Patient was last seen by Dr. Sharee Holsterpalski for chronic care on 07/02/16. MPulliam, CMA/RT(R)

## 2017-06-09 ENCOUNTER — Other Ambulatory Visit: Payer: Self-pay | Admitting: Family Medicine

## 2017-06-09 MED ORDER — ALPRAZOLAM 0.5 MG PO TABS: 0.5000 mg | ORAL_TABLET | Freq: Every day | ORAL | 0 refills | Status: DC | PRN

## 2017-06-09 NOTE — Telephone Encounter (Signed)
Already sent to Dr. Sharee Holsterpalski, waiting on response. MPulliam, CMA/RT(R)

## 2017-06-09 NOTE — Telephone Encounter (Signed)
Medication was last written during the last office visit on 07/02/16 for #90.  Patient has not gotten any filled since that date per [place] controlled substance website. MPulliam, CMA/RT(R)

## 2017-06-09 NOTE — Telephone Encounter (Signed)
When was last RF?    And has she had 2 or more refills in the past 12 months?    When it comes to controlled substances with our new policy, these are questions I will need to know on each and every patient.  Thank you

## 2017-06-09 NOTE — Telephone Encounter (Signed)
Okay to refill.  Just let patient know if she is going to need any more than a 90-day supply over a years time, we will need to make sure we do contract paperwork and have a follow-up discussion about all of that.

## 2017-06-20 ENCOUNTER — Other Ambulatory Visit: Payer: Self-pay | Admitting: Family Medicine

## 2017-10-18 ENCOUNTER — Encounter: Payer: Self-pay | Admitting: Adult Health

## 2017-10-18 ENCOUNTER — Ambulatory Visit (INDEPENDENT_AMBULATORY_CARE_PROVIDER_SITE_OTHER): Payer: Self-pay | Admitting: Adult Health

## 2017-10-18 VITALS — BP 132/71 | HR 79 | Temp 98.1°F | Ht 64.0 in | Wt 163.9 lb

## 2017-10-18 DIAGNOSIS — R195 Other fecal abnormalities: Secondary | ICD-10-CM | POA: Insufficient documentation

## 2017-10-18 DIAGNOSIS — R300 Dysuria: Secondary | ICD-10-CM | POA: Insufficient documentation

## 2017-10-18 DIAGNOSIS — R109 Unspecified abdominal pain: Secondary | ICD-10-CM | POA: Insufficient documentation

## 2017-10-18 DIAGNOSIS — R8290 Unspecified abnormal findings in urine: Secondary | ICD-10-CM | POA: Insufficient documentation

## 2017-10-18 LAB — POCT URINALYSIS DIPSTICK
Bilirubin, UA: NEGATIVE
Glucose, UA: NEGATIVE
Ketones, UA: NEGATIVE
Nitrite, UA: NEGATIVE
Protein, UA: NEGATIVE
SPEC GRAV UA: 1.01 (ref 1.010–1.025)
UROBILINOGEN UA: 0.2 U/dL
pH, UA: 7 (ref 5.0–8.0)

## 2017-10-18 MED ORDER — NITROFURANTOIN MONOHYD MACRO 100 MG PO CAPS: 100.0000 mg | ORAL_CAPSULE | Freq: Two times a day (BID) | ORAL | 0 refills | Status: DC

## 2017-10-18 NOTE — Patient Instructions (Addendum)
Urinary Tract Infection, Adult A urinary tract infection (UTI) is an infection of any part of the urinary tract. The urinary tract includes the:  Kidneys.  Ureters.  Bladder.  Urethra.  These organs make, store, and get rid of pee (urine) in the body. Follow these instructions at home:  Take over-the-counter and prescription medicines only as told by your doctor.  If you were prescribed an antibiotic medicine, take it as told by your doctor. Do not stop taking the antibiotic even if you start to feel better.  Avoid the following drinks: ? Alcohol. ? Caffeine. ? Tea. ? Carbonated drinks.  Drink enough fluid to keep your pee clear or pale yellow.  Keep all follow-up visits as told by your doctor. This is important.  Make sure to: ? Empty your bladder often and completely. Do not to hold pee for long periods of time. ? Empty your bladder before and after sex. ? Wipe from front to back after a bowel movement if you are female. Use each tissue one time when you wipe. Contact a doctor if:  You have back pain.  You have a fever.  You feel sick to your stomach (nauseous).  You throw up (vomit).  Your symptoms do not get better after 3 days.  Your symptoms go away and then come back. Get help right away if:  You have very bad back pain.  You have very bad lower belly (abdominal) pain.  You are throwing up and cannot keep down any medicines or water. This information is not intended to replace advice given to you by your health care provider. Make sure you discuss any questions you have with your health care provider. Document Released: 07/21/2007 Document Revised: 07/10/2015 Document Reviewed: 12/23/2014 Elsevier Interactive Patient Education  2018 [employer].  [place] Diet A bland diet consists of foods that do not have a lot of fat or fiber. Foods without fat or fiber are easier for the body to digest. They are also less likely to irritate your mouth, throat,  stomach, and other parts of your gastrointestinal tract. A bland diet is sometimes called a BRAT diet. What is my plan? Your health care provider or dietitian may recommend specific changes to your diet to prevent and treat your symptoms, such as:  Eating small meals often.  Cooking food until it is soft enough to chew easily.  Chewing your food well.  Drinking fluids slowly.  Not eating foods that are very spicy, sour, or fatty.  Not eating citrus fruits, such as oranges and grapefruit.  What do I need to know about this diet?  Eat a variety of foods from the bland diet food list.  Do not follow a bland diet longer than you have to.  Ask your health care provider whether you should take vitamins. What foods can I eat? Grains  Hot cereals, such as cream of wheat. Bread, crackers, or tortillas made from refined white flour. Rice. Vegetables Canned or cooked vegetables. Mashed or boiled potatoes. Fruits Bananas. Applesauce. Other types of cooked or canned fruit with the skin and seeds removed, such as canned peaches or pears. Meats and Other Protein Sources Scrambled eggs. Creamy peanut butter or other nut butters. Lean, well-cooked meats, such as chicken or fish. Tofu. Soups or broths. Dairy Low-fat dairy products, such as milk, cottage cheese, or yogurt. Beverages Water. Herbal tea. Apple juice. Sweets and Desserts Pudding. Custard. Fruit gelatin. Ice cream. Fats and Oils Mild salad dressings. Canola or olive oil. The  items listed above may not be a complete list of allowed foods or beverages. Contact your dietitian for more options. What foods are not recommended? Foods and ingredients that are often not recommended include:  Spicy foods, such as hot sauce or salsa.  Fried foods.  Sour foods, such as pickled or fermented foods.  Raw vegetables or fruits, especially citrus or berries.  Caffeinated drinks.  Alcohol.  Strongly flavored seasonings or  condiments.  The items listed above may not be a complete list of foods and beverages that are not allowed. Contact your dietitian for more information. This information is not intended to replace advice given to you by your health care provider. Make sure you discuss any questions you have with your health care provider. Document Released: 05/26/2015 Document Revised: 07/10/2015 Document Reviewed: 02/13/2014 Elsevier Interactive Patient Education  2018 [employer].  Please start Macrobid 100mg  twice daily, take for 7 days. Continue to push fluids and following BRAT diet. Please use OTC Imodium per manufacturer's instructions. Also recommend OTC ProBiotic to help with GI upset. If diarrhea and abdominal pain do not improve in 1 week, please follow-up with Dr. Sharee Holster. We will call you when urine culture/senstivity results are available. FEEL BETTER!

## 2017-10-18 NOTE — Progress Notes (Signed)
Subjective:    Patient ID: Mary Wade, female    DOB: 1960-07-21, 57 y.o.   MRN: [id number]  HPI:  Mary Wade presents with dysuria, frequency, urgency that has been worsening >2 weeks. 6 weeks ago she has had bronchitis treated with OTC Mucinex. 4 weeks ago she had L dental implant inflammation treated with Azithromycin and Prednisone taper. She took 5 days of old rx of Bactrim >1.5 weeks ago to treat UTI sx's, and has had loose stools (>10/day) the last 6 days. She also reports acute LLQ pain over the weekend. She denies abdominal pain at present but is slightly nauseated. She has hx of pancreatis. Both appendix and gallbladder have been removed. She has not been seen by GI in years, she reports her last colonoscopy was normal. She estimates to drink >80 oz water and >20 ox cranberry juice/day. She reports poor appetite, however weight is stable today- 163 lbs, last weight in April 2019 - 162 lb She denies fever, but has had "some chills at night" She denies current ETOH use  Patient Care Team    Relationship Specialty Notifications Start End  Thomasene Lot, DO PCP - General Family Medicine  11/19/15   Lewayne Bunting, MD Consulting Physician Cardiology  10/31/15   Dominica Severin, MD Consulting Physician Orthopedic Surgery  10/31/15     Patient Active Problem List   Diagnosis Date Noted  . Dysuria 10/18/2017  . Flank pain 10/18/2017  . Abnormal urinalysis 10/18/2017  . Loose stools 10/18/2017  . Nasal drainage 05/18/2017  . Acute frontal sinusitis 05/18/2017  . Vitamin B12 nutritional deficiency 07/02/2016  . Hypertriglyceridemia 12/26/2015  . Vitamin D deficiency 12/26/2015  . Low HDL (under 40) 12/26/2015  . History of alcohol use disorder 12/26/2015  . h/o PVC's (premature ventricular contractions) 11/01/2015  . dx in past w/ Fibromyalgia 11/01/2015  . Aortic insufficiency 11/01/2015  . Anxiety state 11/01/2015  . Emotional crisis as acute reaction to exceptional  (gross) stress 11/01/2015  . GERD (gastroesophageal reflux disease) 10/31/2015  . Gallstone Pancreatitis 10/31/2015  . Diarrhea- 2nd to GB removal 10/31/2015  . Tobacco abuse counseling 10/31/2015  . Neuropathy 05/23/2015  . Arthritis of hand 05/23/2015  . Cerebral aneurysm 01/24/2015  . Right carotid bruit 10/17/2014  . h/o Chest pains 10/17/2014  . Palpitations 09/28/2013  . Bicuspid aortic valve 09/28/2013  . Hyperlipidemia 09/28/2013  . Tobacco abuse 09/28/2013  . Essential hypertension 09/28/2013     Past Medical History:  Diagnosis Date  . Aortic insufficiency    a. 10/2013: mod AI by echo.  . Bicuspid aortic valve    a. 10/24/13 showed EF 55-60%, no RWMA, bicuspid AV with mod AI, mildly dilated ascending aorta, PASP .  Marland Kitchen Cerebral aneurysm   . Chronic diarrhea   . Fibromyalgia   . Gallstones   . GERD (gastroesophageal reflux disease)   . Heart murmur   . Hyperlipidemia   . Hypertension   . Pancreatitis    2 previous episodes, last one 2013  . PVC's (premature ventricular contractions)    a. 10/2013 event monitor: NSR and PVCs.  . Tobacco abuse      Past Surgical History:  Procedure Laterality Date  . ABDOMINAL HYSTERECTOMY    . APPENDECTOMY    . CHOLECYSTECTOMY       Family History  Problem Relation Age of Onset  . CAD Mother        Died at 90, several blockages but patient unknown when this started  .  Aneurysm Mother        Brain aneurysm - died of this during surgery  . Hypertension Mother   . Hyperlipidemia Mother   . Thyroid disease Mother   . Heart disease Father        Died of MI at age 7 - enlarged heart  . Heart attack Father   . Early death Father   . Diabetes Paternal Grandmother   . Esophageal cancer Cousin   . Ulcerative colitis Daughter   . Diabetes Daughter   . Diabetes Son      Social History   Substance and Sexual Activity  Drug Use No     Social History   Substance and Sexual Activity  Alcohol Use Yes  .  Alcohol/week: 0.0 standard drinks   Comment: twice per week - 2-6 beers on each occasion     Social History   Tobacco Use  Smoking Status Former Smoker  . Packs/day: 1.00  . Years: 42.00  . Pack years: 42.00  . Types: Cigarettes  . Last attempt to quit: 01/23/2016  . Years since quitting: 1.7  Smokeless Tobacco Never Used     Outpatient Encounter Medications as of 10/18/2017  Medication Sig  . ALPRAZolam (XANAX) 0.5 MG tablet Take 1 tablet (0.5 mg total) by mouth daily as needed for anxiety.  . ALPRAZolam (XANAX) 0.5 MG tablet TAKE 1 TABLET BY MOUTH EVERY DAY AS NEEDED FOR ANXIETY  . bisoprolol-hydrochlorothiazide (ZIAC) 10-6.25 MG tablet Take 1 tablet by mouth daily.  . Cholecalciferol (VITAMIN D3) 5000 units CAPS Take 1 capsule (5,000 Units total) by mouth daily.  Marland Kitchen ibuprofen (ADVIL,MOTRIN) 200 MG tablet Take 200 mg by mouth every 6 (six) hours as needed for mild pain or moderate pain.  Marland Kitchen omeprazole (PRILOSEC) 20 MG capsule Take 2 capsules (40 mg total) by mouth daily. (Patient taking differently: Take 20 mg by mouth daily. )  . predniSONE (DELTASONE) 20 MG tablet 1 tab every 12 hrs for three days.  1 tab daily for three days.  . promethazine (PHENERGAN) 25 MG tablet Take 1 tablet (25 mg total) by mouth every 6 (six) hours as needed for nausea or vomiting.  . Vitamin D, Ergocalciferol, (DRISDOL) 50000 units CAPS capsule Take 1 capsule (50,000 Units total) by mouth every 7 (seven) days.  . nitrofurantoin, macrocrystal-monohydrate, (MACROBID) 100 MG capsule Take 1 capsule (100 mg total) by mouth 2 (two) times daily.  . [DISCONTINUED] amoxicillin-clavulanate (AUGMENTIN) 875-125 MG tablet Take 1 tablet by mouth 2 (two) times daily.  . [DISCONTINUED] buPROPion (WELLBUTRIN XL) 300 MG 24 hr tablet Take 1 tablet (300 mg total) by mouth daily.  . [DISCONTINUED] chlorpheniramine-HYDROcodone (TUSSIONEX) 10-8 MG/5ML SUER Take 5 mLs by mouth every 12 (twelve) hours as needed for cough.  .  [DISCONTINUED] niacin (SLO-NIACIN) 500 MG tablet Take 1 tablet (500 mg total) by mouth at bedtime. Take along with baby aspirin   No facility-administered encounter medications on file as of 10/18/2017.     Allergies: Crestor [rosuvastatin] and Pravastatin  Body mass index is 28.13 kg/m.  Blood pressure 132/71, pulse 79, temperature 98.1 F (36.7 C), temperature source Oral, height 5\' 4"  (1.626 m), weight 163 lb 14.4 oz (74.3 kg), SpO2 99 %.  Review of Systems  Constitutional: Positive for activity change, appetite change, chills and fatigue. Negative for diaphoresis, fever and unexpected weight change.  HENT: Negative for congestion.   Eyes: Negative for visual disturbance.  Respiratory: Negative for cough, chest tightness, shortness of breath, wheezing and  stridor.   Cardiovascular: Negative for chest pain, palpitations and leg swelling.  Gastrointestinal: Positive for abdominal distention, abdominal pain, diarrhea and nausea. Negative for blood in stool, constipation and vomiting.  Endocrine: Negative for cold intolerance, heat intolerance, polydipsia, polyphagia and polyuria.  Genitourinary: Positive for dysuria, flank pain and urgency. Negative for difficulty urinating and hematuria.  Hematological: Does not bruise/bleed easily.       Objective:   Physical Exam  Constitutional: She is oriented to person, place, and time. She appears well-developed and well-nourished. No distress.  HENT:  Head: Normocephalic and atraumatic.  Right Ear: External ear normal.  Left Ear: External ear normal.  Nose: Nose normal.  Mouth/Throat: Oropharynx is clear and moist.  Cardiovascular: Normal rate, regular rhythm, normal heart sounds and intact distal pulses.  No murmur heard. Pulmonary/Chest: Effort normal and breath sounds normal. No stridor. No respiratory distress. She has no wheezes. She has no rales. She exhibits no tenderness.  Abdominal: Soft. She exhibits no distension and no mass.  Bowel sounds are increased. There is no tenderness. There is CVA tenderness. There is no rigidity, no rebound, no guarding, no tenderness at McBurney's point and negative Murphy's sign. No hernia.  Hyperactive BS in all quadrants   Neurological: She is alert and oriented to person, place, and time.  Skin: Skin is warm and dry. Capillary refill takes less than 2 seconds. No rash noted. She is not diaphoretic. No erythema. No pallor.  Psychiatric: She has a normal mood and affect. Her behavior is normal. Judgment and thought content normal.  Nursing note and vitals reviewed.     Assessment & Plan:   1. Dysuria   2. Flank pain   3. Abnormal urinalysis   4. Loose stools     Abnormal urinalysis UA Blood+ Leu + Sent for c/s Started on Macrobid 100mg  BID x 7 days Continue to push fluids   Loose stools Declined referral to GI Continue to push fluids and following BRAT diet. Please use OTC Imodium per manufacturer's instructions. Also recommend OTC ProBiotic to help with GI upset. If diarrhea and abdominal pain do not improve in 1 week, please follow-up with Dr. Sharee Holster.     FOLLOW-UP:  Return if symptoms worsen or fail to improve.

## 2017-10-18 NOTE — Assessment & Plan Note (Signed)
UA Blood+ Leu + Sent for c/s Started on Macrobid 100mg  BID x 7 days Continue to push fluids

## 2017-10-18 NOTE — Assessment & Plan Note (Signed)
Declined referral to GI Continue to push fluids and following BRAT diet. Please use OTC Imodium per manufacturer's instructions. Also recommend OTC ProBiotic to help with GI upset. If diarrhea and abdominal pain do not improve in 1 week, please follow-up with Dr. Sharee Holster.

## 2017-10-20 LAB — CULTURE, URINE COMPREHENSIVE

## 2017-11-27 ENCOUNTER — Other Ambulatory Visit: Payer: Self-pay | Admitting: Family Medicine

## 2017-11-27 DIAGNOSIS — E559 Vitamin D deficiency, unspecified: Secondary | ICD-10-CM

## 2017-12-26 ENCOUNTER — Other Ambulatory Visit: Payer: Self-pay

## 2017-12-26 ENCOUNTER — Telehealth: Payer: Self-pay | Admitting: Family Medicine

## 2017-12-26 DIAGNOSIS — I10 Essential (primary) hypertension: Secondary | ICD-10-CM

## 2017-12-26 MED ORDER — BISOPROLOL-HYDROCHLOROTHIAZIDE 10-6.25 MG PO TABS: 1.0000 | ORAL_TABLET | Freq: Every day | ORAL | 1 refills | Status: DC

## 2017-12-26 NOTE — Telephone Encounter (Signed)
Pt called to request Rx refill on( pt has been out of meds for 3 dys) :  bisoprolol-hydrochlorothiazide (ZIAC) 10-6.25 MG tablet [[id number]]   Order Details  Dose: 1 tablet Route: Oral Frequency: Daily  Dispense Quantity: 90 tablet Refills: 3 Fills remaining: --        Sig: Take 1 tablet by mouth daily.     Patient also has switched pharmacies, now using  :   [facility] - [place], [place] - [address] [phone] (Phone) [phone] (Fax)   ---Forwarding request to medical assistant.  -glh

## 2017-12-26 NOTE — Telephone Encounter (Signed)
Refill sent into the pharmacy.  Patient notified. MPulliam, CMA/RT(R)  

## 2017-12-26 NOTE — Telephone Encounter (Signed)
Patient called requesting refill for Ziac.  Reviewed chart and sent in refill per office policy.  Patient notified. MPulliam, CMA/RT(R)

## 2018-05-15 ENCOUNTER — Telehealth: Payer: Self-pay | Admitting: Family Medicine

## 2018-05-15 ENCOUNTER — Other Ambulatory Visit: Payer: Self-pay

## 2018-05-15 DIAGNOSIS — F411 Generalized anxiety disorder: Secondary | ICD-10-CM

## 2018-05-15 MED ORDER — ALPRAZOLAM 0.5 MG PO TABS: 0.5000 mg | ORAL_TABLET | Freq: Every day | ORAL | 0 refills | Status: DC | PRN

## 2018-05-15 NOTE — Telephone Encounter (Signed)
Patient requesting a refill on Xanax.  Medication last filled on 06/09/2017. LOV 10/20/2017.  [place] controlled substance data base review and report printed for Dr. Sharee Holster to review. MPulliam, CMA/RT(R)

## 2018-05-15 NOTE — Telephone Encounter (Signed)
Sent to Dr. Opalski to review. MPulliam, CMA/RT(R)  

## 2018-05-15 NOTE — Telephone Encounter (Signed)
Please make pt aware that with such a large dose, we can only RF this once yrly not more!  Thnx!!

## 2018-05-15 NOTE — Telephone Encounter (Signed)
Patient is requesting refill of her Xanax, if approved please send to [facility].

## 2018-05-16 NOTE — Telephone Encounter (Signed)
Patient notified. MPulliam, CMA/RT(R)  

## 2018-07-03 ENCOUNTER — Other Ambulatory Visit: Payer: Self-pay | Admitting: Family Medicine

## 2018-07-03 DIAGNOSIS — I10 Essential (primary) hypertension: Secondary | ICD-10-CM

## 2018-07-27 ENCOUNTER — Encounter: Payer: Self-pay | Admitting: Family Medicine

## 2018-07-27 ENCOUNTER — Other Ambulatory Visit: Payer: Self-pay

## 2018-07-27 ENCOUNTER — Ambulatory Visit (INDEPENDENT_AMBULATORY_CARE_PROVIDER_SITE_OTHER): Payer: Self-pay | Admitting: Family Medicine

## 2018-07-27 VITALS — Temp 97.6°F | Ht 64.0 in | Wt 164.0 lb

## 2018-07-27 DIAGNOSIS — F411 Generalized anxiety disorder: Secondary | ICD-10-CM

## 2018-07-27 DIAGNOSIS — F430 Acute stress reaction: Secondary | ICD-10-CM

## 2018-07-27 DIAGNOSIS — I10 Essential (primary) hypertension: Secondary | ICD-10-CM

## 2018-07-27 DIAGNOSIS — Z720 Tobacco use: Secondary | ICD-10-CM

## 2018-07-27 DIAGNOSIS — E559 Vitamin D deficiency, unspecified: Secondary | ICD-10-CM

## 2018-07-27 DIAGNOSIS — Z716 Tobacco abuse counseling: Secondary | ICD-10-CM

## 2018-07-27 DIAGNOSIS — E782 Mixed hyperlipidemia: Secondary | ICD-10-CM

## 2018-07-27 MED ORDER — VITAMIN D (ERGOCALCIFEROL) 1.25 MG (50000 UNIT) PO CAPS: ORAL_CAPSULE | ORAL | 3 refills | Status: DC

## 2018-07-27 MED ORDER — BISOPROLOL-HYDROCHLOROTHIAZIDE 10-6.25 MG PO TABS: 1.0000 | ORAL_TABLET | Freq: Every day | ORAL | 1 refills | Status: DC

## 2018-07-27 NOTE — Progress Notes (Signed)
Telehealth office visit note for Mary Wade, D.O- [facility]   I connected with current patient today and verified that I am speaking with the correct person using two identifiers.   . Location of the patient: Home . Location of the provider: Office Only the patient (+/- their family members at pt's discretion) and myself were participating in the encounter    - This visit type was conducted due to national recommendations for restrictions regarding the COVID-19 Pandemic (e.g. social distancing) in an effort to limit this patient's exposure and mitigate transmission in our community.  This format is felt to be most appropriate for this patient at this time.   - The patient did not have access to video technology or had technical difficulties with video requiring transitioning to audio format only. - No physical exam could be performed with this format, beyond that communicated to us by the patient/ family members as noted.   - Additionally my office staff/ schedulers discussed with the patient that there may be a monetary charge related to this service, depending on their medical insurance.   The patient expressed understanding, and agreed to proceed.       History of Present Illness:  Mood:  Having a lot of anger about the riots etc.  " I am crying about my country."   I am disappointed they are lashing out at police and taking down statues- she doesn't know what our country has come to.     averaging btwn 7-10k steps /day.  Walks in evenings-   - Still smoking -  Pt quit for about 3 months- and pt gained wt-   Now back up to 1 ppd or more.  Helps her with stress.    -Wt:  lost 15 lbs as well since last OV - mostly prior to covid.   Feeling much better  - HLD:  Pt has declined med sin the past.  She has declined labs and has been non-compliant with my treatment plans.   -  Pt doesn't have medical insurance  And can't afford to come much and have regular  f/ups,  labs etc.     - HTN:  pt not checking BP at home.  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  She denies sx  -  daughter getting married in August - trying to not stress, but she is    Impression and Recommendations:    1. Emotional crisis as acute reaction to exceptional (gross) stress   2. Essential hypertension   3. Mixed hyperlipidemia   4. Anxiety state   5. Vitamin D deficiency   6. Tobacco abuse   7. Tobacco abuse counseling   8. Vitamin D insufficiency      -We have not gotten labs on patient since 2018!   -  She states she feels absolutely well and has not changed anything about her diet or supplements etc.   - Because she does not have insurance, she has found it to be a huge medical burden in the past when we got labs on her.  She understands the possible risks associated with this and consents to getting medications despite not doing the labs.  She understands this can cause major side-effects and even death.   We discussed this and she still declined. -Reminded patient of her hyperlipidemia which was significant.  Along with her smoking currently and long history -  reminded patient how terrible this can be for her health and wellbeing and how this puts her at very significant risk for heart attacks and strokes.  - HTN:  Needs to check BP about once or twice wkly or more often if any sx; Declines labs but wants RF of med. SHe understands potential risks  - Told pt to think seriously about quitting smoking!  Told pt it is very important for her health and well being.  NOt ready to quit at all-  "she can't imagine doing that" now  - Smoking cessation instruction/counseling given:  counseled patient on the dangers of tobacco use, advised patient to stop smoking, and reviewed strategies to maximize success Told to call 1-800-QUIT-NOW 365-052-5421(1-(971) 406-7597-669) for free smoking cessation counseling and support, or pt can go online to www.heart.org - the  American Heart Association website and search "quit smoking ".   Mood:  Pt declines meds- states she deosn't need a daily one - Importance of healthy eating, getting adequate sleep, daily exercise and seeking the help of a professional counselor discussed.  Pt encouraged to call one for an appt for counseling.  - Meditation and relaxation techniques discussed with patient.   Deep breathing/ Square breathing exercises reviewed.  - Encouraged daily meditation and regular exercise - Anticipatory guidance given and pt was encouraged to return to clinic or call the office with any further questions or concerns. ---->   Using Xanax for panic attacks and emotional outburst.  Reminded patient that one 90-day supply of alprazolam should last her a year.  Last dispensed 05/15/2018  - As part of my medical decision making, I reviewed the following data within the electronic MEDICAL RECORD NUMBER History obtained from pt /family, CMA notes reviewed and incorporated if applicable, Labs reviewed, Radiograph/ tests reviewed if applicable and OV notes from prior OV's with me, as well as other specialists she/he has seen since seeing me last, were all reviewed and used in my medical decision making process today.   - Additionally, discussion had with patient regarding txmnt plan, and their biases/concerns about that plan were used in my medical decision making today.   - The patient agreed with the plan and demonstrated an understanding of the instructions.   No barriers to understanding were identified.   - Red flag symptoms and signs discussed in detail.  Patient expressed understanding regarding what to do in case of emergency\ urgent symptoms.  The patient was advised to call back or seek an in-person evaluation if the symptoms worsen or if the condition fails to improve as anticipated.   Return f/up sooner if mood deteriorates, for 4-104106mo-  BP, HLD, Tob abuse, gerd mood- GAD.     Meds ordered this encounter   Medications  . bisoprolol-hydrochlorothiazide (ZIAC) 10-6.25 MG tablet    Sig: Take 1 tablet by mouth daily. Patient needs office visit for refills    Dispense:  90 tablet    Refill:  1  . Vitamin D, Ergocalciferol, (DRISDOL) 1.25 MG (50000 UT) CAPS capsule    Sig: TAKE ONE CAPSULE BY MOUTH EVERY 7 DAYS    Dispense:  12 capsule    Refill:  3    Medications Discontinued During This Encounter  Medication Reason  . predniSONE (DELTASONE) 20 MG tablet Completed Course  . nitrofurantoin, macrocrystal-monohydrate, (MACROBID) 100 MG capsule Completed Course  . Vitamin D, Ergocalciferol, (DRISDOL) 50000 units CAPS capsule Reorder  . bisoprolol-hydrochlorothiazide (ZIAC) 10-6.25 MG tablet Reorder      I provided 18  minutes of non-face-to-face time during this encounter,with over 50% of the time in direct counseling on patients medical conditions/ medical concerns.  Additional time was spent with charting and coordination of care after the actual visit commenced.   Note:  This note was prepared with assistance of Dragon voice recognition software. Occasional wrong-word or sound-a-like substitutions may have occurred due to the inherent limitations of voice recognition software.  Mellody Dance, DO     Patient Care Team    Relationship Specialty Notifications Start End  Mellody Dance, DO PCP - General Family Medicine  11/19/15   Lelon Perla, MD Consulting Physician Cardiology  10/31/15   Roseanne Kaufman, MD Consulting Physician Orthopedic Surgery  10/31/15      -Vitals obtained; medications/ allergies reconciled;  personal medical, social, Sx etc.histories were updated by CMA, reviewed by me and are reflected in chart   Patient Active Problem List   Diagnosis Date Noted  . Hypertriglyceridemia 12/26/2015    Priority: High  . History of alcohol use disorder 12/26/2015    Priority: High  . Anxiety state 11/01/2015    Priority: High  . Emotional crisis as acute reaction to  exceptional (gross) stress 11/01/2015    Priority: High  . Hyperlipidemia 09/28/2013    Priority: High  . Essential hypertension 09/28/2013    Priority: High  . Vitamin D deficiency 12/26/2015    Priority: Medium  . Aortic insufficiency 11/01/2015    Priority: Medium  . GERD (gastroesophageal reflux disease) 10/31/2015    Priority: Medium  . Gallstone Pancreatitis 10/31/2015    Priority: Medium  . Tobacco abuse counseling 10/31/2015    Priority: Medium  . Right carotid bruit 10/17/2014    Priority: Medium  . Tobacco abuse 09/28/2013    Priority: Medium  . Low HDL (under 40) 12/26/2015    Priority: Low  . h/o PVC's (premature ventricular contractions) 11/01/2015    Priority: Low  . dx in past w/ Fibromyalgia 11/01/2015    Priority: Low  . Diarrhea- 2nd to GB removal 10/31/2015    Priority: Low  . h/o Chest pains 10/17/2014    Priority: Low  . Vitamin D insufficiency 08/11/2018  . Dysuria 10/18/2017  . Flank pain 10/18/2017  . Abnormal urinalysis 10/18/2017  . Loose stools 10/18/2017  . Nasal drainage 05/18/2017  . Acute frontal sinusitis 05/18/2017  . Vitamin B12 nutritional deficiency 07/02/2016  . Neuropathy 05/23/2015  . Arthritis of hand 05/23/2015  . Cerebral aneurysm 01/24/2015  . Palpitations 09/28/2013  . Bicuspid aortic valve 09/28/2013     Current Meds  Medication Sig  . ALPRAZolam (XANAX) 0.5 MG tablet Take 1 tablet (0.5 mg total) by mouth daily as needed for anxiety.  . bisoprolol-hydrochlorothiazide (ZIAC) 10-6.25 MG tablet Take 1 tablet by mouth daily. Patient needs office visit for refills  . Cholecalciferol (VITAMIN D3) 5000 units CAPS Take 1 capsule (5,000 Units total) by mouth daily.  Marland Kitchen ibuprofen (ADVIL,MOTRIN) 200 MG tablet Take 200 mg by mouth every 6 (six) hours as needed for mild pain or moderate pain.  Marland Kitchen omeprazole (PRILOSEC) 20 MG capsule Take 2 capsules (40 mg total) by mouth daily. (Patient taking differently: Take 20 mg by mouth daily. )   . Vitamin D, Ergocalciferol, (DRISDOL) 1.25 MG (50000 UT) CAPS capsule TAKE ONE CAPSULE BY MOUTH EVERY 7 DAYS  . [DISCONTINUED] bisoprolol-hydrochlorothiazide (ZIAC) 10-6.25 MG tablet Take 1 tablet by mouth daily. Patient needs office visit for refills  . [DISCONTINUED] Vitamin D, Ergocalciferol, (DRISDOL) 50000 units CAPS  capsule TAKE ONE CAPSULE BY MOUTH EVERY 7 DAYS     Allergies:  Allergies  Allergen Reactions  . Crestor [Rosuvastatin]     Mood changes  . Pravastatin     Muscle aches, elevated LFTs     ROS:  See above HPI for pertinent positives and negatives   Objective:   Temperature 97.6 F (36.4 C), height 5\' 4"  (1.626 m), weight 164 lb (74.4 kg).  (if some vitals are omitted, this means that patient was UNABLE to obtain them even though they were asked to get them prior to OV today.  They were asked to call us at their earliest convenience with these once obtained. )  General: A & O * 3; sounds in no acute distress; in usual state of health.  Skin: Pt confirms warm and dry extremities and pink fingertips HEENT: Pt confirms lips non-cyanotic Chest: Patient confirms normal chest excursion and movement Respiratory: speaking in full sentences, no conversational dyspnea; patient confirms no use of accessory muscles Psych: insight appears good, mood- appears full

## 2018-08-11 DIAGNOSIS — E559 Vitamin D deficiency, unspecified: Secondary | ICD-10-CM | POA: Insufficient documentation

## 2018-10-09 NOTE — Progress Notes (Signed)
Virtual Visit via Telephone Note  I connected with Mary SermonSusan M Wade on 10/10/2018 at  9:15 AM EDT by telephone and verified that I am speaking with the correct person using two identifiers.  Location: Patient: Home Provider: In Clinic   I discussed the limitations, risks, security and privacy concerns of performing an evaluation and management service by telephone and the availability of in person appointments. I also discussed with the patient that there may be a patient responsible charge related to this service. The patient expressed understanding and agreed to proceed.   History of Present Illness: Mary Wade calls in with R otalgia (9/10, constant aching pain), copious clear nasal drainage, R sided facial pain/swelling, sore throat (2/10), intermittent non-productive cough- sx's began >1 week and significantly worsened 3 days ago. She continues to smoke: 1/2- 1 pack/day She reports re-current sinusitis and states :this feels exactly like all the other times" She denies any known SARS-CoV- 2 exposure She has been increasing fluids, using OTC Tylenol Sinus/Allergy and two doses of an "old PCN RX" Advised never to use old ABX rx's Hx of seasonal allergies + She reports hx of R ear drum rupture >40 years ago  Patient Care Team    Relationship Specialty Notifications Start End  Thomasene Lotpalski, Deborah, DO PCP - General Family Medicine  11/19/15   Lewayne Buntingrenshaw, Brian S, MD Consulting Physician Cardiology  10/31/15   Dominica SeverinGramig, William, MD Consulting Physician Orthopedic Surgery  10/31/15     Patient Active Problem List   Diagnosis Date Noted  . Vitamin D insufficiency 08/11/2018  . Dysuria 10/18/2017  . Flank pain 10/18/2017  . Abnormal urinalysis 10/18/2017  . Loose stools 10/18/2017  . Nasal drainage 05/18/2017  . Acute frontal sinusitis 05/18/2017  . Vitamin B12 nutritional deficiency 07/02/2016  . Hypertriglyceridemia 12/26/2015  . Vitamin D deficiency 12/26/2015  . Low HDL (under 40)  12/26/2015  . History of alcohol use disorder 12/26/2015  . h/o PVC's (premature ventricular contractions) 11/01/2015  . dx in past w/ Fibromyalgia 11/01/2015  . Aortic insufficiency 11/01/2015  . Anxiety state 11/01/2015  . Emotional crisis as acute reaction to exceptional (gross) stress 11/01/2015  . GERD (gastroesophageal reflux disease) 10/31/2015  . Gallstone Pancreatitis 10/31/2015  . Diarrhea- 2nd to GB removal 10/31/2015  . Tobacco abuse counseling 10/31/2015  . Neuropathy 05/23/2015  . Arthritis of hand 05/23/2015  . Cerebral aneurysm 01/24/2015  . Right carotid bruit 10/17/2014  . h/o Chest pains 10/17/2014  . Palpitations 09/28/2013  . Bicuspid aortic valve 09/28/2013  . Hyperlipidemia 09/28/2013  . Tobacco abuse 09/28/2013  . Essential hypertension 09/28/2013     Past Medical History:  Diagnosis Date  . Aortic insufficiency    a. 10/2013: mod AI by echo.  . Bicuspid aortic valve    a. 10/24/13 showed EF 55-60%, no RWMA, bicuspid AV with mod AI, mildly dilated ascending aorta, PASP 32mmHg.  Marland Kitchen. Cerebral aneurysm   . Chronic diarrhea   . Fibromyalgia   . Gallstones   . GERD (gastroesophageal reflux disease)   . Heart murmur   . Hyperlipidemia   . Hypertension   . Pancreatitis    2 previous episodes, last one 2013  . PVC's (premature ventricular contractions)    a. 10/2013 event monitor: NSR and PVCs.  . Tobacco abuse      Past Surgical History:  Procedure Laterality Date  . ABDOMINAL HYSTERECTOMY    . APPENDECTOMY    . CHOLECYSTECTOMY       Family History  Problem  Relation Age of Onset  . CAD Mother        Died at 5773, several blockages but patient unknown when this started  . Aneurysm Mother        Brain aneurysm - died of this during surgery  . Hypertension Mother   . Hyperlipidemia Mother   . Thyroid disease Mother   . Heart disease Father        Died of MI at age 57 - enlarged heart  . Heart attack Father   . Early death Father   . Diabetes  Paternal Grandmother   . Esophageal cancer Cousin   . Ulcerative colitis Daughter   . Diabetes Daughter   . Diabetes Son      Social History   Substance and Sexual Activity  Drug Use No     Social History   Substance and Sexual Activity  Alcohol Use Yes  . Alcohol/week: 0.0 standard drinks   Comment: twice per week - 2-6 beers on each occasion     Social History   Tobacco Use  Smoking Status Former Smoker  . Packs/day: 1.00  . Years: 42.00  . Pack years: 42.00  . Types: Cigarettes  . Quit date: 01/23/2016  . Years since quitting: 2.7  Smokeless Tobacco Never Used     Outpatient Encounter Medications as of 10/10/2018  Medication Sig  . ALPRAZolam (XANAX) 0.5 MG tablet Take 1 tablet (0.5 mg total) by mouth daily as needed for anxiety.  . bisoprolol-hydrochlorothiazide (ZIAC) 10-6.25 MG tablet Take 1 tablet by mouth daily. Patient needs office visit for refills  . Cholecalciferol (VITAMIN D3) 5000 units CAPS Take 1 capsule (5,000 Units total) by mouth daily.  Marland Kitchen. ibuprofen (ADVIL,MOTRIN) 200 MG tablet Take 200 mg by mouth every 6 (six) hours as needed for mild pain or moderate pain.  Marland Kitchen. omeprazole (PRILOSEC) 20 MG capsule Take 2 capsules (40 mg total) by mouth daily. (Patient taking differently: Take 20 mg by mouth daily. )  . promethazine (PHENERGAN) 25 MG tablet Take 1 tablet (25 mg total) by mouth every 6 (six) hours as needed for nausea or vomiting.  . pseudoephedrine-acetaminophen (TYLENOL SINUS) 30-500 MG TABS tablet Take 1 tablet by mouth every 4 (four) hours as needed.  . Vitamin D, Ergocalciferol, (DRISDOL) 1.25 MG (50000 UT) CAPS capsule TAKE ONE CAPSULE BY MOUTH EVERY 7 DAYS  . amoxicillin-clavulanate (AUGMENTIN) 875-125 MG tablet Take 1 tablet by mouth 2 (two) times daily.   No facility-administered encounter medications on file as of 10/10/2018.     Allergies: Crestor [rosuvastatin] and Pravastatin  Body mass index is 25.75 kg/m.  Temperature 97.8 F  (36.6 C), temperature source Oral, height 5\' 4"  (1.626 m), weight 150 lb (68 kg). Review of Systems: General:   Denies fever, chills, unexplained weight loss.  Optho/Auditory:   Denies visual changes, blurred vision/LOV Respiratory:   Denies SOB, DOE more than baseline levels. Cough +, Wheezing - ENT: Ear Pain +, Nasal Drainage +, SoreThroat + Cardiovascular:   Denies chest pain, palpitations, new onset peripheral edema  Gastrointestinal:   Denies nausea, vomiting, diarrhea.  Genitourinary: Denies dysuria, freq/ urgency, flank pain or discharge from genitals.  Endocrine:     Denies hot or cold intolerance, polyuria, polydipsia. Musculoskeletal:   Denies unexplained myalgias, joint swelling, unexplained arthralgias, gait problems.  Skin:  Denies rash, suspicious lesions Neurological:     Denies dizziness, unexplained weakness, numbness  Psychiatric/Behavioral:   Denies mood changes, suicidal or homicidal ideations, hallucinations  Observations/Objective: Pt sounds very congested during the telephone conversation, however no respiratory distress noted.  Assessment and Plan: Never take old ABX Augmentin 875mg  BID x 10 days Push fluids, rest, OTC Acetaminophen PRN Reduce to stop tobacco use If sx's persist after ABX completed call clinic  Follow Up Instructions: PRN   I discussed the assessment and treatment plan with the patient. The patient was provided an opportunity to ask questions and all were answered. The patient agreed with the plan and demonstrated an understanding of the instructions.   The patient was advised to call back or seek an in-person evaluation if the symptoms worsen or if the condition fails to improve as anticipated.  I provided 12 minutes of non-face-to-face time during this encounter.   Esaw Grandchild, NP

## 2018-10-10 ENCOUNTER — Other Ambulatory Visit: Payer: Self-pay

## 2018-10-10 ENCOUNTER — Encounter: Payer: Self-pay | Admitting: Adult Health

## 2018-10-10 ENCOUNTER — Ambulatory Visit (INDEPENDENT_AMBULATORY_CARE_PROVIDER_SITE_OTHER): Payer: Self-pay | Admitting: Adult Health

## 2018-10-10 DIAGNOSIS — J310 Chronic rhinitis: Secondary | ICD-10-CM | POA: Insufficient documentation

## 2018-10-10 DIAGNOSIS — H6690 Otitis media, unspecified, unspecified ear: Secondary | ICD-10-CM | POA: Insufficient documentation

## 2018-10-10 DIAGNOSIS — J329 Chronic sinusitis, unspecified: Secondary | ICD-10-CM | POA: Insufficient documentation

## 2018-10-10 MED ORDER — AMOXICILLIN-POT CLAVULANATE 875-125 MG PO TABS: 1.0000 | ORAL_TABLET | Freq: Two times a day (BID) | ORAL | 0 refills | Status: DC

## 2018-10-10 NOTE — Assessment & Plan Note (Signed)
  Assessment and Plan: Never take old ABX Augmentin 875mg  BID x 10 days Push fluids, rest, OTC Acetaminophen PRN Reduce to stop tobacco use If sx's persist after ABX completed call clinic  Follow Up Instructions: PRN   I discussed the assessment and treatment plan with the patient. The patient was provided an opportunity to ask questions and all were answered. The patient agreed with the plan and demonstrated an understanding of the instructions.   The patient was advised to call back or seek an in-person evaluation if the symptoms worsen or if the condition fails to improve as anticipated.

## 2018-10-10 NOTE — Assessment & Plan Note (Signed)
  Assessment and Plan: Never take old ABX Augmentin 875mg BID x 10 days Push fluids, rest, OTC Acetaminophen PRN Reduce to stop tobacco use If sx's persist after ABX completed call clinic  Follow Up Instructions: PRN   I discussed the assessment and treatment plan with the patient. The patient was provided an opportunity to ask questions and all were answered. The patient agreed with the plan and demonstrated an understanding of the instructions.   The patient was advised to call back or seek an in-person evaluation if the symptoms worsen or if the condition fails to improve as anticipated. 

## 2019-02-12 ENCOUNTER — Other Ambulatory Visit: Payer: Self-pay | Admitting: Family Medicine

## 2019-02-12 DIAGNOSIS — I10 Essential (primary) hypertension: Secondary | ICD-10-CM

## 2019-03-12 ENCOUNTER — Other Ambulatory Visit: Payer: Self-pay

## 2019-03-12 ENCOUNTER — Encounter: Payer: Self-pay | Admitting: Family Medicine

## 2019-03-12 ENCOUNTER — Ambulatory Visit: Payer: Self-pay | Attending: Internal Medicine

## 2019-03-12 ENCOUNTER — Ambulatory Visit (INDEPENDENT_AMBULATORY_CARE_PROVIDER_SITE_OTHER): Payer: Self-pay | Admitting: Family Medicine

## 2019-03-12 VITALS — BP 128/72 | Temp 97.6°F | Ht 64.0 in | Wt 161.0 lb

## 2019-03-12 DIAGNOSIS — F410 Panic disorder [episodic paroxysmal anxiety] without agoraphobia: Secondary | ICD-10-CM

## 2019-03-12 DIAGNOSIS — F430 Acute stress reaction: Secondary | ICD-10-CM

## 2019-03-12 DIAGNOSIS — J309 Allergic rhinitis, unspecified: Secondary | ICD-10-CM | POA: Insufficient documentation

## 2019-03-12 DIAGNOSIS — G479 Sleep disorder, unspecified: Secondary | ICD-10-CM

## 2019-03-12 DIAGNOSIS — F17200 Nicotine dependence, unspecified, uncomplicated: Secondary | ICD-10-CM | POA: Insufficient documentation

## 2019-03-12 DIAGNOSIS — Z9119 Patient's noncompliance with other medical treatment and regimen: Secondary | ICD-10-CM

## 2019-03-12 DIAGNOSIS — B9689 Other specified bacterial agents as the cause of diseases classified elsewhere: Secondary | ICD-10-CM

## 2019-03-12 DIAGNOSIS — Z20822 Contact with and (suspected) exposure to covid-19: Secondary | ICD-10-CM | POA: Insufficient documentation

## 2019-03-12 DIAGNOSIS — J019 Acute sinusitis, unspecified: Secondary | ICD-10-CM

## 2019-03-12 DIAGNOSIS — E782 Mixed hyperlipidemia: Secondary | ICD-10-CM

## 2019-03-12 DIAGNOSIS — I10 Essential (primary) hypertension: Secondary | ICD-10-CM

## 2019-03-12 DIAGNOSIS — E781 Pure hyperglyceridemia: Secondary | ICD-10-CM

## 2019-03-12 DIAGNOSIS — Z91199 Patient's noncompliance with other medical treatment and regimen due to unspecified reason: Secondary | ICD-10-CM | POA: Insufficient documentation

## 2019-03-12 DIAGNOSIS — Z716 Tobacco abuse counseling: Secondary | ICD-10-CM

## 2019-03-12 MED ORDER — ALPRAZOLAM 0.5 MG PO TABS: 0.5000 mg | ORAL_TABLET | ORAL | 0 refills | Status: DC | PRN

## 2019-03-12 MED ORDER — PREDNISONE 20 MG PO TABS: ORAL_TABLET | ORAL | 0 refills | Status: DC

## 2019-03-12 MED ORDER — AMOXICILLIN-POT CLAVULANATE 875-125 MG PO TABS: 1.0000 | ORAL_TABLET | Freq: Two times a day (BID) | ORAL | 0 refills | Status: AC

## 2019-03-12 MED ORDER — BISOPROLOL-HYDROCHLOROTHIAZIDE 10-6.25 MG PO TABS: 1.0000 | ORAL_TABLET | Freq: Every day | ORAL | 1 refills | Status: DC

## 2019-03-12 NOTE — Progress Notes (Signed)
Virtual office visit note for Marsh & McLennanDeborah Corlene Sabia, D.O- [facility]   I connected with current patient today and beyond visually recognizing the correct individual, I verified that I am speaking with the correct person using two identifiers.  . Location of the patient: Home . Location of the provider: Office Only the patient (+/- their family members at pt's discretion) and myself were participating in the encounter   - This visit type was conducted due to national recommendations for restrictions regarding the COVID-19 Pandemic (e.g. social distancing) in an effort to limit this patient's exposure and mitigate transmission in our community.  This format is felt to be most appropriate for this patient at this time.   - No physical exam could be performed with this format, beyond that communicated to us by the patient/ family members as noted.   - Additionally my office staff/ schedulers discussed with the patient that there may be a monetary charge related to this service, depending on patient's medical insurance.   The patient expressed understanding, and agreed to proceed.      History of Present Illness: Hypertension and Anxiety   I, Peggye FothergillKatherine Galloway, am serving as scribe for Dr. Thomasene Loteborah Armeda Plumb.  Notes she's been well.  - Acute URI On January 12th, did some heavy duty cleaning in the office, and when she woke up the next day, her "head was full and I was sneezing my head off for three days."  Says she's had no fever, no loss of taste, but notes she hasn't gotten over it in two weeks.  She's been taking chlortab to alleviate her symptoms.  Notes "it dries it up in there, in my head."  Says "I'm making it pretty good with [chlortab], I just don't seem to be recovering from it like I would like to say I did."  Over the course of two weeks, states "it seemed to be getting a little worse on me yesterday."  Notes she worked Saturday, stayed under her mask all  day, kept socially distant from others at work, and has done two virtual classes.  Has had dizziness and headaches due to her sinuses recently.  Says she would like to have an antibiotic to see how it would help her, and agrees to obtain a COVID-19 test.  Notes "I don't want to go back to work sick for sure."  - Mood Management Notes her mood is fine.  "It's been frustrating to live in the world as it is, but I think I'm going to give it to a greater being than myself."  Emotionally, she feels very good.  Her daughter got married this summer, and notes she was pretty much out of alprazolam in August "because that was stress" and she took more during the events of her daughter's wedding.  Notes it was her daughter's first year teaching, then the COVID hit, which caused her daughter's salary to be cut, along with other stressors.  Notes she takes the alprazolam primarily to sleep a little better sometimes, "but if it gets crazy, I will take one."  States "I just like to have 'em if I do need 'em."    She has not taken alprazolam since her stressors in August.  - Insomnia To aid with sleep, she has been using Tylenol PM and historically used alprazolam.  - Vitamin D Taking 5000 IU's per day OTC.  - Tobacco Abuse She continues smoking.  HPI:  Hypertension:  -  Her blood pressure at home has been running: Notes her BP has been controlled, around 128/72.  Denies visual changes, chest pain, shortness of breath, or difficulty breathing.  - Patient reports good compliance with medication and/or lifestyle modification  - Her denies acute concerns or problems related to treatment plan  - She denies new onset of: chest pain, exercise intolerance, shortness of breath, dizziness, visual changes, headache, lower extremity swelling or claudication.   Last 3 blood pressure readings in our office are as follows: BP Readings from Last 3 Encounters:  03/12/19 128/72  10/18/17 132/71  05/18/17  106/66   Filed Weights   03/12/19 0759  Weight: 161 lb (73 kg)        Depression screen [facility] 2/9 03/12/2019 10/10/2018 07/27/2018 10/18/2017 05/18/2017  Decreased Interest 0 0 0 0 0  Down, Depressed, Hopeless 0 0 0 0 0  PHQ - 2 Score 0 0 0 0 0  Altered sleeping 0 0 0 0 0  Tired, decreased energy 1 0 0 1 0  Change in appetite 0 0 0 0 0  Feeling bad or failure about yourself  0 0 0 0 0  Trouble concentrating 0 0 0 0 0  Moving slowly or fidgety/restless 0 0 0 0 0  Suicidal thoughts 0 0 0 0 0  PHQ-9 Score 1 0 0 1 0  Difficult doing work/chores Not difficult at all - Not difficult at all Not difficult at all -    GAD 7 : Generalized Anxiety Score 03/12/2019 07/27/2018 10/08/2016 07/02/2016  Nervous, Anxious, on Edge 0 0 0 0  Control/stop worrying 0 0 0 0  Worry too much - different things 0 0 0 0  Trouble relaxing 0 0 0 0  Restless 0 0 0 0  Easily annoyed or irritable 0 0 0 0  Afraid - awful might happen 0 0 0 0  Total GAD 7 Score 0 0 0 0  Anxiety Difficulty - Not difficult at all Not difficult at all Not difficult at all     Impression and Recommendations:    1. Essential hypertension   2. Panic attack as reaction to stress   3. Sleep difficulties   4. Allergic sinusitis   5. Acute bacterial sinusitis   6. Mixed hyperlipidemia   7. Hypertriglyceridemia   8. Personal history of noncompliance with medical treatment, presenting hazards to health   9. Smoker unmotivated to quit   10. Tobacco abuse counseling     -Discussed with patient importance of coming in for yearly blood work.  This is to monitor her hyperlipidemia, and various other conditions especially while on medications.  Patient has not been seen or evaluated for blood work since May 2018.  Patient states she cannot afford it and this is why she has not come.  She recognizes it should be done yearly.  Allergic Sinusitis, Acute Bacterial Sinusitis - Discussed patient's symptoms extensively during appointment.  -  Reviewed importance of obtaining a COVID-19 test today. - Advised patient to visit [facility] for further information and scheduling testing.  - Since her symptoms appear to be worsening and not improving, advised patient to visit an Urgent Care for examination and additional assistance.  Patient declines visit to Urgent Care.  -  As patient is with symptoms for 10+ days and worsening, augmentin & prednisone prescribed today to cover for bronchitis and sinusitis.  Patient did not want to go to Urgent Care, as we preferred and recommended.  Trial  of antibiotics provided today.  - If symptoms worsen and do not improve on management as prescribed, patient knows to receive acute care at Urgent Care as advised.  - Will continue to monitor.   Panic Attack as Reaction to Stress - Stable at this time.  - Discussed prudent use of alprazolam. - Advised patient to utilize medication sparingly as possible- risks assoc with med d/c pt. - 30-day supply prescribed for patient today. - PDMP clear/ checked  - Reviewed meaning of controlled substances with patient today and importance of avoiding chronic use.  - Will continue to monitor.   Sleep Difficulties - Reviewed options for sleep assistance with patient today. - Risks, benefits, and alternatives discussed.  - Patient declines additional assistance today.  - Will continue to monitor.   Essential Hypertension - Blood pressure currently is at goal, stable. - Patient will continue current treatment regimen.  - Counseled patient on pathophysiology of disease and discussed various treatment options, which always includes dietary and lifestyle modification as first line.   - Lifestyle changes such as dash and heart healthy diets and engaging in a regular exercise program discussed extensively with patient.   - Ambulatory blood pressure monitoring encouraged at least 3 times weekly.  Keep log and bring in every office visit.  Reminded  patient that if they ever feel poorly in any way, to check their blood pressure and pulse.  - We will continue to monitor.   Smoker Unmotivated to Quit - Tobacco Abuse & Smoking Cessation Counseling Told pt to think seriously about quitting smoking!  Told pt it is very important for his/her health and well being.  - Smoking cessation instruction/ counseling given of at least 5 minutes:  counseled patient on the dangers of tobacco use and reviewed strategies to maximize success - Discussed with patient that there are multiple treatments to aid in quitting smoking, however I explained none will work unless pt really wants to quit - Told to call 1-800-QUIT-NOW (925)241-6991) for free smoking cessation counseling and support, or pt can go online to www.heart.org - the American Heart Association website and search "quit smoking ".    Recommendations - Need for CPE and full fasting lab work near future.   - As part of my medical decision making, I reviewed the following data within the [facility] History obtained from pt /family, CMA notes reviewed and incorporated if applicable, Labs reviewed, Radiograph/ tests reviewed if applicable and OV notes from prior OV's with me, as well as other specialists she/he has seen since seeing me last, were all reviewed and used in my medical decision making process today.   - Additionally, discussion had with patient regarding txmnt plan, their biases about that plan etc were used in my medical decision making today.   - The patient agreed with the plan and demonstrated an understanding of the instructions.   No barriers to understanding were identified.    - Red flag symptoms and signs discussed in detail.  Patient expressed understanding regarding what to do in case of emergency\ urgent symptoms.  The patient was advised to call back or seek an in-person evaluation if the symptoms worsen or if the condition fails to improve as  anticipated.   Return for f/up in MAY for CPE and full fasting lab work same day.    Meds ordered this encounter  Medications  . bisoprolol-hydrochlorothiazide (ZIAC) 10-6.25 MG tablet    Sig: Take 1 tablet by mouth daily.  Dispense:  90 tablet    Refill:  1  . ALPRAZolam (XANAX) 0.5 MG tablet    Sig: Take 1 tablet (0.5 mg total) by mouth as needed.    Dispense:  30 tablet    Refill:  0  . amoxicillin-clavulanate (AUGMENTIN) 875-125 MG tablet    Sig: Take 1 tablet by mouth 2 (two) times daily for 10 days.    Dispense:  20 tablet    Refill:  0  . predniSONE (DELTASONE) 20 MG tablet    Sig: Take 3 tabs po * 2 days, then 2 tabs for 2 d, then 1 tab 2 d, then 1/2 tab 2 days.    Dispense:  15 tablet    Refill:  0    Medications Discontinued During This Encounter  Medication Reason  . amoxicillin-clavulanate (AUGMENTIN) 875-125 MG tablet Error  . promethazine (PHENERGAN) 25 MG tablet   . ALPRAZolam (XANAX) 0.5 MG tablet Reorder  . bisoprolol-hydrochlorothiazide (ZIAC) 10-6.25 MG tablet Reorder      Note:  This note was prepared with assistance of Dragon voice recognition software. Occasional wrong-word or sound-a-like substitutions may have occurred due to the inherent limitations of voice recognition software.   This document serves as a record of services personally performed by Thomasene Lot, DO. It was created on her behalf by Peggye Fothergill, a trained medical scribe. The creation of this record is based on the scribe's personal observations and the provider's statements to them.   This case required medical decision making of at least moderate complexity. The above documentation has been reviewed to be accurate and was completed by Carlye Grippe, D.O.       Patient Care Team    Relationship Specialty Notifications Start End  Thomasene Lot, DO PCP - General Family Medicine  11/19/15   Lewayne Bunting, MD Consulting Physician Cardiology  10/31/15    Dominica Severin, MD Consulting Physician Orthopedic Surgery  10/31/15     -Vitals obtained; medications/ allergies reconciled;  personal medical, social, Sx etc.histories were updated by CMA, reviewed by me and are reflected in chart  Patient Active Problem List   Diagnosis Date Noted  . Hypertriglyceridemia 12/26/2015  . History of alcohol use disorder 12/26/2015  . Anxiety state 11/01/2015  . Emotional crisis as acute reaction to exceptional (gross) stress 11/01/2015  . Mixed hyperlipidemia 09/28/2013  . Essential hypertension 09/28/2013  . Vitamin D deficiency 12/26/2015  . Aortic insufficiency 11/01/2015  . GERD (gastroesophageal reflux disease) 10/31/2015  . Gallstone Pancreatitis 10/31/2015  . Tobacco abuse counseling 10/31/2015  . Right carotid bruit 10/17/2014  . Tobacco abuse 09/28/2013  . Low HDL (under 40) 12/26/2015  . h/o PVC's (premature ventricular contractions) 11/01/2015  . dx in past w/ Fibromyalgia 11/01/2015  . Diarrhea- 2nd to GB removal 10/31/2015  . h/o Chest pains 10/17/2014  . Allergic sinusitis 03/12/2019  . Sleep difficulties 03/12/2019  . Personal history of noncompliance with medical treatment, presenting hazards to health 03/12/2019  . Smoker unmotivated to quit 03/12/2019  . Otitis media 10/10/2018  . Rhinosinusitis 10/10/2018  . Vitamin D insufficiency 08/11/2018  . Dysuria 10/18/2017  . Flank pain 10/18/2017  . Abnormal urinalysis 10/18/2017  . Loose stools 10/18/2017  . Nasal drainage 05/18/2017  . Acute frontal sinusitis 05/18/2017  . Vitamin B12 nutritional deficiency 07/02/2016  . Neuropathy 05/23/2015  . Arthritis of hand 05/23/2015  . Cerebral aneurysm 01/24/2015  . Palpitations 09/28/2013  . Bicuspid aortic valve 09/28/2013     Current Meds  Medication Sig  . ALPRAZolam (XANAX) 0.5 MG tablet Take 1 tablet (0.5 mg total) by mouth as needed.  . bisoprolol-hydrochlorothiazide (ZIAC) 10-6.25 MG tablet Take 1 tablet by mouth daily.   . Cholecalciferol (VITAMIN D3) 5000 units CAPS Take 1 capsule (5,000 Units total) by mouth daily.  Marland Kitchen ibuprofen (ADVIL,MOTRIN) 200 MG tablet Take 200 mg by mouth every 6 (six) hours as needed for mild pain or moderate pain.  Marland Kitchen omeprazole (PRILOSEC) 20 MG capsule Take 2 capsules (40 mg total) by mouth daily. (Patient taking differently: Take 20 mg by mouth daily. )  . pseudoephedrine-acetaminophen (TYLENOL SINUS) 30-500 MG TABS tablet Take 1 tablet by mouth every 4 (four) hours as needed.  . [DISCONTINUED] ALPRAZolam (XANAX) 0.5 MG tablet Take 1 tablet (0.5 mg total) by mouth daily as needed for anxiety.  . [DISCONTINUED] bisoprolol-hydrochlorothiazide (ZIAC) 10-6.25 MG tablet TAKE 1 TABLET BY MOUTH DAILY. *PATIENT NEEDS OFFICE VISIT FOR REFILLS*  . [DISCONTINUED] promethazine (PHENERGAN) 25 MG tablet Take 1 tablet (25 mg total) by mouth every 6 (six) hours as needed for nausea or vomiting.     Allergies  Allergen Reactions  . Crestor [Rosuvastatin]     Mood changes  . Pravastatin     Muscle aches, elevated LFTs     ROS:  See above HPI for pertinent positives and negatives   Objective:   Blood pressure 128/72, temperature 97.6 F (36.4 C), temperature source Oral, height 5\' 4"  (1.626 m), weight 161 lb (73 kg).  (if some vitals are omitted, this means that patient was UNABLE to obtain them even though they were asked to get them prior to OV today.  They were asked to call at their earliest convenience with these once obtained.)  General: A & O * 3; visually in no acute distress; in usual state of health.  Skin: Visible skin appears normal and pt's usual skin color HEENT:  EOMI, head is normocephalic and atraumatic.  Sclera are anicteric. Neck has a good range of motion.  Lips are noncyanotic Chest: normal chest excursion and movement Respiratory: speaking in full sentences, no conversational dyspnea; no use of accessory muscles Psych: insight good, mood- appears full

## 2019-03-13 LAB — NOVEL CORONAVIRUS, NAA: SARS-CoV-2, NAA: NOT DETECTED

## 2019-05-23 ENCOUNTER — Ambulatory Visit
Admission: EM | Admit: 2019-05-23 | Discharge: 2019-05-23 | Disposition: A | Discharge status: Home / Self Care | Payer: Self-pay | Attending: Physician Assistant | Admitting: Physician Assistant

## 2019-05-23 ENCOUNTER — Telehealth: Payer: Self-pay | Admitting: Family Medicine

## 2019-05-23 DIAGNOSIS — R030 Elevated blood-pressure reading, without diagnosis of hypertension: Secondary | ICD-10-CM

## 2019-05-23 NOTE — Telephone Encounter (Signed)
Patient has an appt for BP, she has been getting abnormal readings lately and wants to discuss something options she is considering until her appt Friday. Please contact her when available

## 2019-05-23 NOTE — Discharge Instructions (Signed)
Your blood pressure is good at this time. Monitor your blood pressure twice a day for the next 1-2, please document and follow up with PCP. Keep hydrated, urine should be clear to pale yellow in color. If sudden worsening of symptoms with one sided weakness, passing out, go to the emergency department for further evaluation.

## 2019-05-23 NOTE — ED Provider Notes (Signed)
[name]    CSN: [id number] Arrival date & time: 05/23/19  1449      History   Chief Complaint Chief Complaint  Patient presents with  . Hypertension    HPI Mary Wade is a 59 y.o. female.   59 year old female with history of HLD, HTN, pancreatitis, fibromyalgia, cerebral aneurysm, comes in with worries for elevated blood pressure. States earlier today, felt lightheaded with headache. As a results, she took her BP and found it to be 178/104. She talked with PCP, who worried about hypertensive urgency, and sent patient for evaluation. Patient is now asymptomatic. Denies nausea/vomiting. Denies chest pain, shortness of breath. Denies weakness, dizziness, syncope. She had been experiencing intermittent left sided chest pain without obvious exacerbating factor. Denies exertional chest pain, dyspnea on exertion.   Patient with history of HTN, on bisoprolol-HCTZ for the past 10 years without need to change dosage. Does not do daily BP checks, but has not had increased BP readings at office visits.      Past Medical History:  Diagnosis Date  . Aortic insufficiency    a. 10/2013: mod AI by echo.  . Bicuspid aortic valve    a. 10/24/13 showed EF 55-60%, no RWMA, bicuspid AV with mod AI, mildly dilated ascending aorta, PASP 7mmHg.  Marland Kitchen Cerebral aneurysm   . Chronic diarrhea   . Fibromyalgia   . Gallstones   . GERD (gastroesophageal reflux disease)   . Heart murmur   . Hyperlipidemia   . Hypertension   . Pancreatitis    2 previous episodes, last one 2013  . PVC's (premature ventricular contractions)    a. 10/2013 event monitor: NSR and PVCs.  . Tobacco abuse     Patient Active Problem List   Diagnosis Date Noted  . Allergic sinusitis 03/12/2019  . Sleep difficulties 03/12/2019  . Personal history of noncompliance with medical treatment, presenting hazards to health 03/12/2019  . Smoker unmotivated to quit 03/12/2019  . Otitis media 10/10/2018  . Rhinosinusitis  10/10/2018  . Vitamin D insufficiency 08/11/2018  . Dysuria 10/18/2017  . Flank pain 10/18/2017  . Abnormal urinalysis 10/18/2017  . Loose stools 10/18/2017  . Nasal drainage 05/18/2017  . Acute frontal sinusitis 05/18/2017  . Vitamin B12 nutritional deficiency 07/02/2016  . Hypertriglyceridemia 12/26/2015  . Vitamin D deficiency 12/26/2015  . Low HDL (under 40) 12/26/2015  . History of alcohol use disorder 12/26/2015  . h/o PVC's (premature ventricular contractions) 11/01/2015  . dx in past w/ Fibromyalgia 11/01/2015  . Aortic insufficiency 11/01/2015  . Anxiety state 11/01/2015  . Emotional crisis as acute reaction to exceptional (gross) stress 11/01/2015  . GERD (gastroesophageal reflux disease) 10/31/2015  . Gallstone Pancreatitis 10/31/2015  . Diarrhea- 2nd to GB removal 10/31/2015  . Tobacco abuse counseling 10/31/2015  . Neuropathy 05/23/2015  . Arthritis of hand 05/23/2015  . Cerebral aneurysm 01/24/2015  . Right carotid bruit 10/17/2014  . h/o Chest pains 10/17/2014  . Palpitations 09/28/2013  . Bicuspid aortic valve 09/28/2013  . Mixed hyperlipidemia 09/28/2013  . Tobacco abuse 09/28/2013  . Essential hypertension 09/28/2013    Past Surgical History:  Procedure Laterality Date  . ABDOMINAL HYSTERECTOMY    . APPENDECTOMY    . CHOLECYSTECTOMY      OB History   No obstetric history on file.      Home Medications    Prior to Admission medications   Medication Sig Start Date End Date Taking? Authorizing Provider  ALPRAZolam Duanne Moron) 0.5 MG  tablet Take 1 tablet (0.5 mg total) by mouth as needed. 03/12/19   Opalski, Gavin Pound, DO  bisoprolol-hydrochlorothiazide (ZIAC) 10-6.25 MG tablet Take 1 tablet by mouth daily. 03/12/19   Thomasene Lot, DO  Cholecalciferol (VITAMIN D3) 5000 units CAPS Take 1 capsule (5,000 Units total) by mouth daily. 10/08/16   Opalski, Gavin Pound, DO  ibuprofen (ADVIL,MOTRIN) 200 MG tablet Take 200 mg by mouth every 6 (six) hours as needed for  mild pain or moderate pain.    [provider]  omeprazole (PRILOSEC) 20 MG capsule Take 2 capsules (40 mg total) by mouth daily. Patient taking differently: Take 20 mg by mouth daily.  11/07/14   Barrett, Joline Salt, PA-C  pseudoephedrine-acetaminophen (TYLENOL SINUS) 30-500 MG TABS tablet Take 1 tablet by mouth every 4 (four) hours as needed.    [provider]  Vitamin D, Ergocalciferol, (DRISDOL) 1.25 MG (50000 UT) CAPS capsule TAKE ONE CAPSULE BY MOUTH EVERY 7 DAYS Patient not taking: Reported on 03/12/2019 07/27/18   Thomasene Lot, DO    Family History Family History  Problem Relation Age of Onset  . CAD Mother        Died at 39, several blockages but patient unknown when this started  . Aneurysm Mother        Brain aneurysm - died of this during surgery  . Hypertension Mother   . Hyperlipidemia Mother   . Thyroid disease Mother   . Heart disease Father        Died of MI at age 84 - enlarged heart  . Heart attack Father   . Early death Father   . Diabetes Paternal Grandmother   . Esophageal cancer Cousin   . Ulcerative colitis Daughter   . Diabetes Daughter   . Diabetes Son     Social History Social History   Tobacco Use  . Smoking status: Former Smoker    Packs/day: 1.00    Years: 42.00    Pack years: 42.00    Types: Cigarettes    Quit date: 01/23/2016    Years since quitting: 3.3  . Smokeless tobacco: Never Used  Substance Use Topics  . Alcohol use: Yes    Alcohol/week: 0.0 standard drinks    Comment: twice per week - 2-6 beers on each occasion  . Drug use: No     Allergies   Crestor [rosuvastatin] and Pravastatin   Review of Systems Review of Systems  Reason unable to perform ROS: See HPI as above.     Physical Exam Triage Vital Signs ED Triage Vitals  Enc Vitals Group     BP 05/23/19 1455 134/76     Pulse Rate 05/23/19 1455 74     Resp 05/23/19 1455 16     Temp 05/23/19 1455 98.5 F (36.9 C)     Temp Source 05/23/19 1455  Oral     SpO2 05/23/19 1455 95 %     Weight --      Height --      Head Circumference --      Peak Flow --      Pain Score 05/23/19 1544 0     Pain Loc --      Pain Edu? --      Excl. in GC? --    No data found.  Updated Vital Signs BP 136/67 (BP Location: Left Arm)   Pulse 74   Temp 98.5 F (36.9 C) (Oral)   Resp 16   SpO2 95%   Visual Acuity  Right Eye Distance:   Left Eye Distance:   Bilateral Distance:    Right Eye Near:   Left Eye Near:    Bilateral Near:     Physical Exam Constitutional:      General: She is not in acute distress.    Appearance: Normal appearance. She is well-developed. She is not toxic-appearing or diaphoretic.  HENT:     Head: Normocephalic and atraumatic.  Eyes:     Conjunctiva/sclera: Conjunctivae normal.     Pupils: Pupils are equal, round, and reactive to light.  Cardiovascular:     Rate and Rhythm: Normal rate and regular rhythm.  Pulmonary:     Effort: Pulmonary effort is normal. No respiratory distress.     Comments: Speaking in full sentences without difficulty. LCTAB Musculoskeletal:     Cervical back: Normal range of motion and neck supple.  Skin:    General: Skin is warm and dry.  Neurological:     Mental Status: She is alert and oriented to person, place, and time.     Comments: Cranial nerves II-XII grossly intact. Strength 5/5 bilaterally for upper and lower extremity. Sensation intact. Normal coordination with normal finger to nose, heel to shin. Negative pronator drift, romberg. Gait intact. Able to ambulate on own without difficulty.       UC Treatments / Results  Labs (all labs ordered are listed, but only abnormal results are displayed) Labs Reviewed - No data to display  EKG   Radiology No results found.  Procedures Procedures (including critical care time)  Medications Ordered in UC Medications - No data to display  Initial Impression / Assessment and Plan / UC Course  I have reviewed the triage  vital signs and the nursing notes.  Pertinent labs & imaging results that were available during my care of the patient were reviewed by me and considered in my medical decision making (see chart for details).    Patient currently asymptomatic. BP 136/67. EKG NSR, 69bpm, no acute ST changes. Will have patient monitor BP BID for the next 1-2 weeks. Push fluids. Patient to follow up with PCP for recheck and further management needed. Return precautions given. Patient expresses understanding and agrees to plan.  Final Clinical Impressions(s) / UC Diagnoses   Final diagnoses:  Elevated blood pressure reading   ED Prescriptions    None     PDMP not reviewed this encounter.   Belinda Fisher, PA-C 05/23/19 2137

## 2019-05-23 NOTE — Telephone Encounter (Signed)
Patient is aware of the below message and states she might go to UC but wants to keep apt with [place] Friday.    AS, CMA

## 2019-05-23 NOTE — Telephone Encounter (Signed)
She has hypertensive urgency and I highly recommend since she is symptomatic with a significantly elevated blood pressure, this is something that urgently needs to be seen and cannot wait.  I recommend she go to the closest urgent care if she insists on driving somewhere however, the best management would be for her to dial 911 and be driven to the closest emergency care facility

## 2019-05-23 NOTE — ED Triage Notes (Signed)
Pt states had an episode of feeling lightheaded, headache and b/p was 178/104 that lasted around an hour. States has felt good this week. Hx of fibromyagia and hurting from waste down. States has had some lt side to center angina this week also.

## 2019-05-23 NOTE — Telephone Encounter (Signed)
Patient states that she has had a severe headache like an 'aura' and states that shes having dizziness and feeling light headed with a BP reading of 178/104.  I advised patient since she did have an elevated BP and was symptomatic that I suggest she go to UC for evaluation but patient declines and wants me to send a message to provider for instruction. Patient states she is going to take a xanax and maybe double up on her BP med. I advised patient to wait until we called her back for this. Sending message to provider.   AS, CMA

## 2019-05-24 NOTE — Progress Notes (Signed)
Impression and Recommendations:    1. Essential hypertension      Essential hypertension - Plan: bisoprolol-hydrochlorothiazide (ZIAC) 10-6.25 MG tablet: - Blood pressure currently is at goal.   - Reassurance provided that elevated blood pressure was most likely situational given it happened shortly after (30 mins) an argument with her work boss, and blood pressure was wnl in UC and is normal today in the office.   - Explained the importance of monitoring blood pressure to ensure they are staying stable and verbalized she will be more diligent on checking them weekly.   - Explained importance of routine chronic follow-ups including bloodwork to monitor disease and possible complications. She currently is uninsured and is self-pay, which is the reason she hasn't routinely followed-up and hasn't done bloodwork since 2018. Patient is in the process of trying to obtain health insurance. Patient verbalized understanding and stated she would make an appointment for a chronic f/up with labs as soon as she obtained insurance. Patient agreeable to self-pay for only necessary labs if not able to obtain insurance in 3-4 months.      - Patient will continue current treatment regimen. Patient will monitor blood pressure at home and notify the clinic if her blood pressure is not at goal before making any medication changes.    - Counseled patient on pathophysiology of disease and discussed various treatment options, which always includes dietary and lifestyle modification as first line.   - Lifestyle changes such as dash and heart healthy diets and engaging in a regular exercise program discussed extensively with patient.   - Ambulatory blood pressure monitoring encouraged at least 2-3 times weekly.  Keep log and bring in every office visit.  Reminded patient that if she ever feel poorly in any way, to check their blood pressure and pulse.  - Handouts provided and/or told to go online at the [employer] website for further information  - We will continue to monitor    There are no diagnoses linked to this encounter.    Education and routine counseling performed. Handouts provided.  No orders of the defined types were placed in this encounter.   Meds ordered this encounter  Medications  . bisoprolol-hydrochlorothiazide (ZIAC) 10-6.25 MG tablet    Sig: Take 1 tablet by mouth daily.    Dispense:  90 tablet    Refill:  1    Medications Discontinued During This Encounter  Medication Reason  . Vitamin D, Ergocalciferol, (DRISDOL) 1.25 MG (50000 UT) CAPS capsule Completed Course  . bisoprolol-hydrochlorothiazide (ZIAC) 10-6.25 MG tablet Reorder     The patient was counseled, risk factors were discussed, anticipatory guidance given.  Gross side effects, risk and benefits, and alternatives of medications discussed with patient.  Patient is aware that all medications have potential side effects and we are unable to predict every side effect or drug-drug interaction that may occur.  Expresses verbal understanding and consents to current therapy plan and treatment regimen.  Return for is self-pay/ call back to sch chronic f/up as soon as you get insurance/3-4 mons.  Please see AVS handed out to patient at the end of our visit for further patient instructions/ counseling done pertaining to today's office visit.       The [employer] was signed into law in 2016 which includes the topic of electronic health records.  This provides immediate access to information in MyChart.  This includes consultation notes, operative notes, office notes, lab results and pathology  reports.  If you have any questions about what you read please let us know at your next visit or call us at the office.  We are right here with you.    Subjective:     HPI: Mary Wade is a 59 y.o. female who presents to [facility] today for follow up on  hypertension. Patient called the office 05/23/19 for medical recommendation of elevated bp reading of 178/104 and was symptomatic- dizziness, light headedness. She was advised to seek emergent care and went to [facility] for evaluation. Patient's work-up revealed no concerning abnormalities, she was asymptomatic and bp was 136/67. Patient reports she had an argument with her boss that day, which got her very emotional and shortly after didn't feel well and checked her blood pressure, which was elevated. Denies any other hypertensive urgency episodes except when she had pre-eclampsia during one of her pregnancies.     HTN:  - Pt has not been checking blood pressure regularly, only when feeling symptomatic, which is seldom.   - Patient reports good compliance with blood pressure medications  - Pt reports doing pilates 20 mins everyday and has started walking as well.  - Denies medication S-E  - She denies: chest pain,palpitations, exercise intolerance, shortness of breath, dizziness, visual changes, headache, lower extremity swelling.    Last 3 blood pressure readings in our office are as follows: BP Readings from Last 3 Encounters:  05/25/19 119/70  05/23/19 136/67  03/12/19 128/72    Pulse Readings from Last 3 Encounters:  05/25/19 91  05/23/19 74  10/18/17 79    Filed Weights   05/25/19 0810  Weight: 162 lb 4.8 oz (73.6 kg)      Patient Care Team    Relationship Specialty Notifications Start End  Thomasene Lot, DO PCP - General Family Medicine  11/19/15   Lewayne Bunting, MD Consulting Physician Cardiology  10/31/15   Dominica Severin, MD Consulting Physician Orthopedic Surgery  10/31/15      Lab Results  Component Value Date   CREATININE 0.47 (L) 07/02/2016   BUN 14 07/02/2016   NA 140 07/02/2016   K 4.7 07/02/2016   CL 100 07/02/2016   CO2 23 07/02/2016    Lab Results  Component Value Date   CHOL 276 (H) 07/02/2016   CHOL 241 (H) 11/19/2015   CHOL 252 (H)  01/17/2015    Lab Results  Component Value Date   HDL 46 07/02/2016   HDL 37 (L) 11/19/2015   HDL 27 (L) 01/17/2015    Lab Results  Component Value Date   LDLCALC 153 (H) 07/02/2016   LDLCALC NOT CALC 11/19/2015   LDLCALC NOT CALC 01/17/2015    Lab Results  Component Value Date   TRIG 385 (H) 07/02/2016   TRIG 472 (H) 11/19/2015   TRIG 535 (H) 01/17/2015    Lab Results  Component Value Date   CHOLHDL 6.0 (H) 07/02/2016   CHOLHDL 6.5 (H) 11/19/2015   CHOLHDL 9.3 (H) 01/17/2015    Lab Results  Component Value Date   LDLDIRECT 140.2 01/31/2014   LDLDIRECT 152.2 12/26/2013   LDLDIRECT 114.9 10/23/2013   ===================================================================   Patient Active Problem List   Diagnosis Date Noted  . Cerebral aneurysm 01/24/2015  . Bicuspid aortic valve 09/28/2013  . Essential hypertension 09/28/2013  . Allergic sinusitis 03/12/2019  . Sleep difficulties 03/12/2019  . Personal history of noncompliance with medical treatment, presenting hazards to health 03/12/2019  . Smoker  unmotivated to quit 03/12/2019  . Otitis media 10/10/2018  . Rhinosinusitis 10/10/2018  . Vitamin D insufficiency 08/11/2018  . Dysuria 10/18/2017  . Flank pain 10/18/2017  . Abnormal urinalysis 10/18/2017  . Loose stools 10/18/2017  . Nasal drainage 05/18/2017  . Acute frontal sinusitis 05/18/2017  . Vitamin B12 nutritional deficiency 07/02/2016  . Hypertriglyceridemia 12/26/2015  . Vitamin D deficiency 12/26/2015  . Low HDL (under 40) 12/26/2015  . History of alcohol use disorder 12/26/2015  . h/o PVC's (premature ventricular contractions) 11/01/2015  . dx in past w/ Fibromyalgia 11/01/2015  . Aortic insufficiency 11/01/2015  . Anxiety state 11/01/2015  . Emotional crisis as acute reaction to exceptional (gross) stress 11/01/2015  . GERD (gastroesophageal reflux disease) 10/31/2015  . Gallstone Pancreatitis 10/31/2015  . Diarrhea- 2nd to GB removal  10/31/2015  . Tobacco abuse counseling 10/31/2015  . Neuropathy 05/23/2015  . Arthritis of hand 05/23/2015  . Right carotid bruit 10/17/2014  . h/o Chest pains 10/17/2014  . Palpitations 09/28/2013  . Mixed hyperlipidemia 09/28/2013  . Tobacco abuse 09/28/2013     Past Medical History:  Diagnosis Date  . Aortic insufficiency    a. 10/2013: mod AI by echo.  . Bicuspid aortic valve    a. 10/24/13 showed EF 55-60%, no RWMA, bicuspid AV with mod AI, mildly dilated ascending aorta, PASP .  Marland Kitchen Cerebral aneurysm   . Chronic diarrhea   . Fibromyalgia   . Gallstones   . GERD (gastroesophageal reflux disease)   . Heart murmur   . Hyperlipidemia   . Hypertension   . Pancreatitis    2 previous episodes, last one 2013  . PVC's (premature ventricular contractions)    a. 10/2013 event monitor: NSR and PVCs.  . Tobacco abuse      Past Surgical History:  Procedure Laterality Date  . ABDOMINAL HYSTERECTOMY    . APPENDECTOMY    . CHOLECYSTECTOMY       Family History  Problem Relation Age of Onset  . CAD Mother        Died at 68, several blockages but patient unknown when this started  . Aneurysm Mother        Brain aneurysm - died of this during surgery  . Hypertension Mother   . Hyperlipidemia Mother   . Thyroid disease Mother   . Heart disease Father        Died of MI at age 12 - enlarged heart  . Heart attack Father   . Early death Father   . Diabetes Paternal Grandmother   . Esophageal cancer Cousin   . Ulcerative colitis Daughter   . Diabetes Daughter   . Diabetes Son      Social History   Substance and Sexual Activity  Drug Use No  ,  Social History   Substance and Sexual Activity  Alcohol Use Yes  . Alcohol/week: 0.0 standard drinks   Comment: twice per week - 2-6 beers on each occasion  ,  Social History   Tobacco Use  Smoking Status Former Smoker  . Packs/day: 1.00  . Years: 42.00  . Pack years: 42.00  . Types: Cigarettes  . Quit date:  01/23/2016  . Years since quitting: 3.3  Smokeless Tobacco Never Used  ,    Current Outpatient Medications on File Prior to Visit  Medication Sig Dispense Refill  . ALPRAZolam (XANAX) 0.5 MG tablet Take 1 tablet (0.5 mg total) by mouth as needed. 30 tablet 0  . Cholecalciferol (VITAMIN D3) 5000  units CAPS Take 1 capsule (5,000 Units total) by mouth daily. 30 capsule   . ibuprofen (ADVIL,MOTRIN) 200 MG tablet Take 200 mg by mouth every 6 (six) hours as needed for mild pain or moderate pain.    Marland Kitchen omeprazole (PRILOSEC) 20 MG capsule Take 2 capsules (40 mg total) by mouth daily. (Patient taking differently: Take 20 mg by mouth daily. ) 30 capsule 11  . pseudoephedrine-acetaminophen (TYLENOL SINUS) 30-500 MG TABS tablet Take 1 tablet by mouth every 4 (four) hours as needed.     No current facility-administered medications on file prior to visit.     Allergies  Allergen Reactions  . Crestor [Rosuvastatin]     Mood changes  . Pravastatin     Muscle aches, elevated LFTs     Review of Systems:   General:  Denies fever, chills, insomnia Optho/Auditory:   Denies visual changes, blurred vision Respiratory:   Denies SOB, cough, wheeze,   Cardiovascular:   Denies chest pain, palpitations, painful respirations Gastrointestinal:   Denies nausea, vomiting, diarrhea.  Endocrine:     Denies new hot or cold intolerance Musculoskeletal:  Denies joint swelling, gait issues, or new unexplained myalgias/ arthralgias Skin:  Denies rash, suspicious lesions  Neurological:    Denies dizziness, unexplained weakness, numbness  Psychiatric/Behavioral: Denies SI/HI, hallucinations  Objective:    Blood pressure 119/70, pulse 91, temperature 97.9 F (36.6 C), temperature source Oral, height 5\' 4"  (1.626 m), weight 162 lb 4.8 oz (73.6 kg), SpO2 96 %.  Body mass index is 27.86 kg/m.  General: Well Developed, well nourished, and in no acute distress.  HEENT: Normocephalic, atraumatic, pupils equal round  reactive to light, neck supple, No carotid bruits, no JVD Skin: Warm and dry,  Cardiac: Regular rate and rhythm, S1, S2 WNL's Respiratory: ECTA B/L, Not using accessory muscles, speaking in full sentences. NeuroM-Sk: Ambulates w/o assistance, moves ext * 4 w/o difficulty, sensation grossly intact.  Ext: no edema Psych: No HI/SI, judgement and insight good, Euthymic mood. Full Affect.

## 2019-05-24 NOTE — Telephone Encounter (Signed)
Noted thanks °

## 2019-05-25 ENCOUNTER — Encounter: Payer: Self-pay | Admitting: Physician Assistant

## 2019-05-25 ENCOUNTER — Other Ambulatory Visit: Payer: Self-pay

## 2019-05-25 ENCOUNTER — Ambulatory Visit (INDEPENDENT_AMBULATORY_CARE_PROVIDER_SITE_OTHER): Payer: Self-pay | Admitting: Physician Assistant

## 2019-05-25 VITALS — BP 119/70 | HR 91 | Temp 97.9°F | Ht 64.0 in | Wt 162.3 lb

## 2019-05-25 DIAGNOSIS — I10 Essential (primary) hypertension: Secondary | ICD-10-CM

## 2019-05-25 DIAGNOSIS — Z566 Other physical and mental strain related to work: Secondary | ICD-10-CM

## 2019-05-25 MED ORDER — BISOPROLOL-HYDROCHLOROTHIAZIDE 10-6.25 MG PO TABS: 1.0000 | ORAL_TABLET | Freq: Every day | ORAL | 1 refills | Status: DC

## 2019-05-25 NOTE — Patient Instructions (Signed)
Stress   -Also we need to transition your brain into thinking more positively.  These tasks below are some things I want you to do every day 1)  write 3 new things that you are grateful for every day for 21 days  2)  exercise daily- walk for 15 minutes twice a day every day 3)  you are going to journal every day about one positive experience that you had 4)  meditate every day.  You can go on YouTube and look for 15-minute relaxation meditation or what ever.  But we need to make sure that you are in the moment and relaxing and deep breathing every day 5)  Write 1 positive email every day to praise someone in your life     - If you have insomnia or difficulty sleeping, this information is for you:  - Avoid caffeinated beverages after lunch,  no alcoholic beverages,  no eating within 2-3 hours of lying down,  avoid exposure to blue light before bed,  avoid daytime naps, and  needs to maintain a regular sleep schedule- go to sleep and wake up around the same time every night.   - Resolve concerns or worries before entering bedroom:  Discussed relaxation techniques with patient and to keep a journal to write down fears\ worries.  I suggested seeing a counselor for CBT.   - Recommend patient meditate or do deep breathing exercises to help relax.   Incorporate the use of white noise machines or listen to "sleep meditation music", or recordings of guided meditations for sleep from YouTube which are free, such as  "guided meditation for detachment from over thinking"  by Ina Kick.      Your goal blood pressure should be around 135/85 or less on a regular basis.    However, if you are 58 years old or greater, then higher blood pressures are often tolerated based on your medical conditions.  Please follow the recommendations discussed with you by your primary care provider and/or cardiologist.       Hypertension Hypertension, commonly called high blood pressure, is when the force of  blood pumping through the arteries is too strong. The arteries are the blood vessels that carry blood from the heart throughout the body. Hypertension forces the heart to work harder to pump blood and may cause arteries to become narrow or stiff. Having untreated or uncontrolled hypertension can cause heart attacks, strokes, kidney disease, and other problems. A blood pressure reading consists of a higher number over a lower number. Ideally, your blood pressure should be below 120/80. The first ("top") number is called the systolic pressure. It is a measure of the pressure in your arteries as your heart beats. The second ("bottom") number is called the diastolic pressure. It is a measure of the pressure in your arteries as the heart relaxes. What are the causes? The cause of this condition is not known. What increases the risk? Some risk factors for high blood pressure are under your control. Others are not. Factors you can change  Smoking.  Having type 2 diabetes mellitus, high cholesterol, or both.  Not getting enough exercise or physical activity.  Being overweight.  Having too much fat, sugar, calories, or salt (sodium) in your diet.  Drinking too much alcohol. Factors that are difficult or impossible to change  Having chronic kidney disease.  Having a family history of high blood pressure.  Age. Risk increases with age.  Race. You may be at higher risk if  you are African-American.  Gender. Men are at higher risk than women before age 56. After age 25, women are at higher risk than men.  Having obstructive sleep apnea.  Stress. What are the signs or symptoms? Extremely high blood pressure (hypertensive crisis) may cause:  Headache.  Anxiety.  Shortness of breath.  Nosebleed.  Nausea and vomiting.  Severe chest pain.  Jerky movements you cannot control (seizures).  How is this diagnosed? This condition is diagnosed by measuring your blood pressure while you are  seated, with your arm resting on a surface. The cuff of the blood pressure monitor will be placed directly against the skin of your upper arm at the level of your heart. It should be measured at least twice using the same arm. Certain conditions can cause a difference in blood pressure between your right and left arms. Certain factors can cause blood pressure readings to be lower or higher than normal (elevated) for a short period of time:  When your blood pressure is higher when you are in a health care provider's office than when you are at home, this is called white coat hypertension. Most people with this condition do not need medicines.  When your blood pressure is higher at home than when you are in a health care provider's office, this is called masked hypertension. Most people with this condition may need medicines to control blood pressure.  If you have a high blood pressure reading during one visit or you have normal blood pressure with other risk factors:  You may be asked to return on a different day to have your blood pressure checked again.  You may be asked to monitor your blood pressure at home for 1 week or longer.  If you are diagnosed with hypertension, you may have other blood or imaging tests to help your health care provider understand your overall risk for other conditions. How is this treated? This condition is treated by making healthy lifestyle changes, such as eating healthy foods, exercising more, and reducing your alcohol intake. Your health care provider may prescribe medicine if lifestyle changes are not enough to get your blood pressure under control, and if:  Your systolic blood pressure is above 130.  Your diastolic blood pressure is above 80.  Your personal target blood pressure may vary depending on your medical conditions, your age, and other factors. Follow these instructions at home: Eating and drinking  Eat a diet that is high in fiber and potassium,  and low in sodium, added sugar, and fat. An example eating plan is called the DASH (Dietary Approaches to Stop Hypertension) diet. To eat this way: ? Eat plenty of fresh fruits and vegetables. Try to fill half of your plate at each meal with fruits and vegetables. ? Eat whole grains, such as whole wheat pasta, brown rice, or whole grain bread. Fill about one quarter of your plate with whole grains. ? Eat or drink low-fat dairy products, such as skim milk or low-fat yogurt. ? Avoid fatty cuts of meat, processed or cured meats, and poultry with skin. Fill about one quarter of your plate with lean proteins, such as fish, chicken without skin, beans, eggs, and tofu. ? Avoid premade and processed foods. These tend to be higher in sodium, added sugar, and fat.  Reduce your daily sodium intake. Most people with hypertension should eat less than 1,500 mg of sodium a day.  Limit alcohol intake to no more than 1 drink a day for nonpregnant women  and 2 drinks a day for men. One drink equals 12 oz of beer, 5 oz of wine, or 1 oz of hard liquor. Lifestyle  Work with your health care provider to maintain a healthy body weight or to lose weight. Ask what an ideal weight is for you.  Get at least 30 minutes of exercise that causes your heart to beat faster (aerobic exercise) most days of the week. Activities may include walking, swimming, or biking.  Include exercise to strengthen your muscles (resistance exercise), such as pilates or lifting weights, as part of your weekly exercise routine. Try to do these types of exercises for 30 minutes at least 3 days a week.  Do not use any products that contain nicotine or tobacco, such as cigarettes and e-cigarettes. If you need help quitting, ask your health care provider.  Monitor your blood pressure at home as told by your health care provider.  Keep all follow-up visits as told by your health care provider. This is important. Medicines  Take over-the-counter and  prescription medicines only as told by your health care provider. Follow directions carefully. Blood pressure medicines must be taken as prescribed.  Do not skip doses of blood pressure medicine. Doing this puts you at risk for problems and can make the medicine less effective.  Ask your health care provider about side effects or reactions to medicines that you should watch for. Contact a health care provider if:  You think you are having a reaction to a medicine you are taking.  You have headaches that keep coming back (recurring).  You feel dizzy.  You have swelling in your ankles.  You have trouble with your vision. Get help right away if:  You develop a severe headache or confusion.  You have unusual weakness or numbness.  You feel faint.  You have severe pain in your chest or abdomen.  You vomit repeatedly.  You have trouble breathing. Summary  Hypertension is when the force of blood pumping through your arteries is too strong. If this condition is not controlled, it may put you at risk for serious complications.  Your personal target blood pressure may vary depending on your medical conditions, your age, and other factors. For most people, a normal blood pressure is less than 120/80.  Hypertension is treated with lifestyle changes, medicines, or a combination of both. Lifestyle changes include weight loss, eating a healthy, low-sodium diet, exercising more, and limiting alcohol. This information is not intended to replace advice given to you by your health care provider. Make sure you discuss any questions you have with your health care provider. Document Released: 02/01/2005 Document Revised: 12/31/2015 Document Reviewed: 12/31/2015 Elsevier Interactive Patient Education  2018 [employer].    How to Take Your Blood Pressure   Blood pressure is a measurement of how strongly your blood is pressing against the walls of your arteries. Arteries are blood vessels that  carry blood from your heart throughout your body. Your health care provider takes your blood pressure at each office visit. You can also take your own blood pressure at home with a blood pressure machine. You may need to take your own blood pressure:  To confirm a diagnosis of high blood pressure (hypertension).  To monitor your blood pressure over time.  To make sure your blood pressure medicine is working.  Supplies needed: To take your blood pressure, you will need a blood pressure machine. You can buy a blood pressure machine, or blood pressure monitor, at most  drugstores or online. There are several types of home blood pressure monitors. When choosing one, consider the following:  Choose a monitor that has an arm cuff.  Choose a monitor that wraps snugly around your upper arm. You should be able to fit only one finger between your arm and the cuff.  Do not choose a monitor that measures your blood pressure from your wrist or finger.  Your health care provider can suggest a reliable monitor that will meet your needs. How to prepare To get the most accurate reading, avoid the following for 30 minutes before you check your blood pressure:  Drinking caffeine.  Drinking alcohol.  Eating.  Smoking.  Exercising.  Five minutes before you check your blood pressure:  Empty your bladder.  Sit quietly without talking in a dining chair, rather than in a soft couch or armchair.  How to take your blood pressure To check your blood pressure, follow the instructions in the manual that came with your blood pressure monitor. If you have a digital blood pressure monitor, the instructions may be as follows: 1. Sit up straight. 2. Place your feet on the floor. Do not cross your ankles or legs. 3. Rest your left arm at the level of your heart on a table or desk or on the arm of a chair. 4. Pull up your shirt sleeve. 5. Wrap the blood pressure cuff around the upper part of your left arm, 1  inch (2.5 cm) above your elbow. It is best to wrap the cuff around bare skin. 6. Fit the cuff snugly around your arm. You should be able to place only one finger between the cuff and your arm. 7. Position the cord inside the groove of your elbow. 8. Press the power button. 9. Sit quietly while the cuff inflates and deflates. 10. Read the digital reading on the monitor screen and write it down (record it). 11. Wait 2-3 minutes, then repeat the steps, starting at step 1.  What does my blood pressure reading mean? A blood pressure reading consists of a higher number over a lower number. Ideally, your blood pressure should be below 120/80. The first ("top") number is called the systolic pressure. It is a measure of the pressure in your arteries as your heart beats. The second ("bottom") number is called the diastolic pressure. It is a measure of the pressure in your arteries as the heart relaxes. Blood pressure is classified into four stages. The following are the stages for adults who do not have a short-term serious illness or a chronic condition. Systolic pressure and diastolic pressure are measured in a unit called mm Hg. Normal  Systolic pressure: below 315.  Diastolic pressure: below 80. Elevated  Systolic pressure: 400-867.  Diastolic pressure: below 80. Hypertension stage 1  Systolic pressure: 619-509.  Diastolic pressure: 32-67. Hypertension stage 2  Systolic pressure: 124 or above.  Diastolic pressure: 90 or above. You can have prehypertension or hypertension even if only the systolic or only the diastolic number in your reading is higher than normal. Follow these instructions at home:  Check your blood pressure as often as recommended by your health care provider.  Take your monitor to the next appointment with your health care provider to make sure: ? That you are using it correctly. ? That it provides accurate readings.  Be sure you understand what your goal blood  pressure numbers are.  Tell your health care provider if you are having any side effects from blood pressure  medicine. Contact a health care provider if:  Your blood pressure is consistently high. Get help right away if:  Your systolic blood pressure is higher than 180.  Your diastolic blood pressure is higher than 110. This information is not intended to replace advice given to you by your health care provider. Make sure you discuss any questions you have with your health care provider. Document Released: 07/11/2015 Document Revised: 09/23/2015 Document Reviewed: 07/11/2015 Elsevier Interactive Patient Education  [employer]2018 Elsevier Inc.

## 2019-10-24 ENCOUNTER — Other Ambulatory Visit: Payer: Self-pay | Admitting: Physician Assistant

## 2019-10-26 ENCOUNTER — Encounter: Payer: Self-pay | Admitting: Physician Assistant

## 2019-10-26 ENCOUNTER — Other Ambulatory Visit: Payer: Self-pay

## 2019-10-26 ENCOUNTER — Other Ambulatory Visit: Payer: 59

## 2019-10-26 DIAGNOSIS — E782 Mixed hyperlipidemia: Secondary | ICD-10-CM

## 2019-10-26 DIAGNOSIS — E559 Vitamin D deficiency, unspecified: Secondary | ICD-10-CM

## 2019-10-26 DIAGNOSIS — E781 Pure hyperglyceridemia: Secondary | ICD-10-CM

## 2019-10-26 DIAGNOSIS — I10 Essential (primary) hypertension: Secondary | ICD-10-CM

## 2019-10-27 LAB — COMPREHENSIVE METABOLIC PANEL
ALT: 19 IU/L (ref 0–32)
AST: 16 IU/L (ref 0–40)
Albumin/Globulin Ratio: 1.8 (ref 1.2–2.2)
Albumin: 4.2 g/dL (ref 3.8–4.9)
Alkaline Phosphatase: 76 IU/L (ref 48–121)
BUN/Creatinine Ratio: 23 (ref 9–23)
BUN: 10 mg/dL (ref 6–24)
Bilirubin Total: 0.2 mg/dL (ref 0.0–1.2)
CO2: 24 mmol/L (ref 20–29)
Calcium: 9 mg/dL (ref 8.7–10.2)
Chloride: 102 mmol/L (ref 96–106)
Creatinine, Ser: 0.44 mg/dL — ABNORMAL LOW (ref 0.57–1.00)
GFR calc Af Amer: 129 mL/min/{1.73_m2} (ref >59)
GFR calc non Af Amer: 112 mL/min/{1.73_m2} (ref >59)
Globulin, Total: 2.3 g/dL (ref 1.5–4.5)
Glucose: 114 mg/dL — ABNORMAL HIGH (ref 65–99)
Potassium: 4.4 mmol/L (ref 3.5–5.2)
Sodium: 140 mmol/L (ref 134–144)
Total Protein: 6.5 g/dL (ref 6.0–8.5)

## 2019-10-27 LAB — CBC
Hematocrit: 41.4 % (ref 34.0–46.6)
Hemoglobin: 13.6 g/dL (ref 11.1–15.9)
MCH: 30 pg (ref 26.6–33.0)
MCHC: 32.9 g/dL (ref 31.5–35.7)
MCV: 91 fL (ref 79–97)
Platelets: 224 10*3/uL (ref 150–450)
RBC: 4.53 x10E6/uL (ref 3.77–5.28)
RDW: 12.8 % (ref 11.7–15.4)
WBC: 7.9 10*3/uL (ref 3.4–10.8)

## 2019-10-27 LAB — LIPID PANEL
Chol/HDL Ratio: 7.8 ratio — ABNORMAL HIGH (ref 0.0–4.4)
Cholesterol, Total: 248 mg/dL — ABNORMAL HIGH (ref 100–199)
HDL: 32 mg/dL — ABNORMAL LOW (ref >39)
LDL Chol Calc (NIH): 129 mg/dL — ABNORMAL HIGH (ref 0–99)
Triglycerides: 477 mg/dL — ABNORMAL HIGH (ref 0–149)
VLDL Cholesterol Cal: 87 mg/dL — ABNORMAL HIGH (ref 5–40)

## 2019-10-27 LAB — TSH: TSH: 1.1 u[IU]/mL (ref 0.450–4.500)

## 2019-10-27 LAB — HEMOGLOBIN A1C
Est. average glucose Bld gHb Est-mCnc: 123 mg/dL
Hgb A1c MFr Bld: 5.9 % — ABNORMAL HIGH (ref 4.8–5.6)

## 2019-10-27 LAB — VITAMIN D 25 HYDROXY (VIT D DEFICIENCY, FRACTURES): Vit D, 25-Hydroxy: 26.4 ng/mL — ABNORMAL LOW (ref 30.0–100.0)

## 2019-11-02 ENCOUNTER — Telehealth: Payer: Self-pay | Admitting: Physician Assistant

## 2019-11-02 NOTE — Telephone Encounter (Signed)
Letter mailed to patient. AS, CMA

## 2019-11-02 NOTE — Telephone Encounter (Signed)
-----   Message from Mayer Masker, [place] sent at 10/30/2019  2:16 PM EDT ----- Please call Ms. Uriegas and notify of lab results. CBC and TSH are wnl. CMP- fasting glucose is elevated which correlates with increased A1c at 5.9 (prediabetes range) so recommend to reduce carbohydrates and glucose. Mild decline in kidney function so recommend to stay well hydrated and avoid toxic substances to the kidneys such as NSAIDs like ibuprofen or naproxen. If kidney function continues to decline will consider changing blood pressure medication since diuretics can affect kidney function. Vit D is low at 26.4, please inquire if taking Vit D3 supplement. If not, recommend to resume. Lipid panel is elevated. Triglycerides have increased from prior and recommend to reduce simple carbohydrates.   10 year risk score for cardiovascular disease is 12.5% and recommend to work on dietary changes and reduce saturated and trans fats such as fried foods, red meat, dairy, butter to improve cholesterol. Per chart review, pt is intolerant to statins and recommend referral to lipid clinic for other management options or can discuss with cardiologist at upcoming appointment.   The 10-year ASCVD risk score Denman George DC Montez Hageman., et al., 2013) is: 12.5%   Values used to calculate the score:     Age: 59 years     Sex: Female     Is Non-Hispanic African American: No     Diabetic: No     Tobacco smoker: Yes     Systolic Blood Pressure: 119 mmHg     Is BP treated: Yes     HDL Cholesterol: 32 mg/dL     Total Cholesterol: 248 mg/dL  Recommend to schedule a chronic follow up appointment for hypertension, hyperlipidemia, and prediabetes in 1-2 months.  Thank you, Kandis Cocking

## 2019-11-28 NOTE — Progress Notes (Signed)
Referring-Maritza Abonza, PA-C Reason for referral-bicuspid aortic valve  HPI: 59 year old female for evaluation of bicuspid aortic valve at request of Maritza AbonzaPA-C.  Patient seen previously but not since 2016.  ABIs June 2015 normal.  Monitor September 2015 showed sinus with PVCs.  MRA December 2015 showed no thoracic aortic aneurysm.  Brain MRA December 2016 showed small area of vessel irregularity at the anterior right MCA bifurcation felt to be ectasia.  Follow-up recommended 2 years.  Echocardiogram 2016 showed normal LV function, grade 1 diastolic dysfunction question bicuspid aortic valve, mild aortic insufficiency.  Exercise treadmill September 2016 negative.  She has some dyspnea on exertion but no exertional chest pain.  No pedal edema.  No syncope.  She occasionally has palpitations.  Current Outpatient Medications  Medication Sig Dispense Refill   ALPRAZolam (XANAX) 0.5 MG tablet Take 1 tablet (0.5 mg total) by mouth as needed. 30 tablet 0   bisoprolol-hydrochlorothiazide (ZIAC) 10-6.25 MG tablet Take 1 tablet by mouth daily. OFFICE VISIT REQUIRED PRIOR TO ANY FURTHER REFILLS 60 tablet 0   Cholecalciferol (VITAMIN D3) 5000 units CAPS Take 1 capsule (5,000 Units total) by mouth daily. 30 capsule    ibuprofen (ADVIL,MOTRIN) 200 MG tablet Take 200 mg by mouth every 6 (six) hours as needed for mild pain or moderate pain.     omeprazole (PRILOSEC) 20 MG capsule Take 2 capsules (40 mg total) by mouth daily. (Patient taking differently: Take 20 mg by mouth daily. ) 30 capsule 11   pseudoephedrine-acetaminophen (TYLENOL SINUS) 30-500 MG TABS tablet Take 1 tablet by mouth every 4 (four) hours as needed.     No current facility-administered medications for this visit.    Allergies  Allergen Reactions   Crestor [Rosuvastatin]     Mood changes   Pravastatin     Muscle aches, elevated LFTs     Past Medical History:  Diagnosis Date   Aortic insufficiency    a. 10/2013:  mod AI by echo.   Bicuspid aortic valve    a. 10/24/13 showed EF 55-60%, no RWMA, bicuspid AV with mod AI, mildly dilated ascending aorta, PASP .   Cerebral aneurysm    Chronic diarrhea    Fibromyalgia    Gallstones    GERD (gastroesophageal reflux disease)    Heart murmur    Hyperlipidemia    Hypertension    Pancreatitis    2 previous episodes, last one 2013   PVC's (premature ventricular contractions)    a. 10/2013 event monitor: NSR and PVCs.   Tobacco abuse     Past Surgical History:  Procedure Laterality Date   ABDOMINAL HYSTERECTOMY     APPENDECTOMY     CHOLECYSTECTOMY      Social History   Socioeconomic History   Marital status: Single    Spouse name: Not on file   Number of children: 2   Years of education: Not on file   Highest education level: Not on file  Occupational History    Employer: [employer]    Comment: Dental office  Tobacco Use   Smoking status: Former Smoker    Packs/day: 1.00    Years: 42.00    Pack years: 42.00    Types: Cigarettes    Quit date: 01/23/2016    Years since quitting: 3.8   Smokeless tobacco: Never Used  Vaping Use   Vaping Use: Never used  Substance and Sexual Activity   Alcohol use: Yes    Alcohol/week: 0.0 standard drinks  Comment: twice per week - 2-6 beers on each occasion   Drug use: No   Sexual activity: Yes  Other Topics Concern   Not on file  Social History Narrative   Single mother   Daughter and son, about to adopt a foster child in upcoming 2017   Dental hygienist   Social Determinants of Health   Financial Resource Strain:    Difficulty of Paying Living Expenses: Not on file  Food Insecurity:    Worried About Programme researcher, broadcasting/film/video in the Last Year: Not on file   [employer] of Food in the Last Year: Not on file  Transportation Needs:    Lack of Transportation (Medical): Not on file   Lack of Transportation (Non-Medical): Not on file  Physical Activity:    Days of  Exercise per Week: Not on file   Minutes of Exercise per Session: Not on file  Stress:    Feeling of Stress : Not on file  Social Connections:    Frequency of Communication with Friends and Family: Not on file   Frequency of Social Gatherings with Friends and Family: Not on file   Attends Religious Services: Not on file   Active Member of Clubs or Organizations: Not on file   Attends Banker Meetings: Not on file   Marital Status: Not on file  Intimate Partner Violence:    Fear of Current or Ex-Partner: Not on file   Emotionally Abused: Not on file   Physically Abused: Not on file   Sexually Abused: Not on file    Family History  Problem Relation Age of Onset   CAD Mother        Died at 33, several blockages but patient unknown when this started   Aneurysm Mother        Brain aneurysm - died of this during surgery   Hypertension Mother    Hyperlipidemia Mother    Thyroid disease Mother    Heart disease Father        Died of MI at age 27 - enlarged heart   Heart attack Father    Early death Father    Diabetes Paternal Grandmother    Esophageal cancer Cousin    Ulcerative colitis Daughter    Diabetes Daughter    Diabetes Son     ROS: no fevers or chills, productive cough, hemoptysis, dysphasia, odynophagia, melena, hematochezia, dysuria, hematuria, rash, seizure activity, orthopnea, PND, pedal edema, claudication. Remaining systems are negative.  Physical Exam:   Blood pressure 140/60, pulse 71, temperature (!) 97.3 F (36.3 C), height 5\' 4"  (1.626 m), weight 163 lb 9.6 oz (74.2 kg), SpO2 98 %.  General:  Well developed/well nourished in NAD Skin warm/dry Patient not depressed No peripheral clubbing Back-normal HEENT-normal/normal eyelids Neck supple/normal carotid upstroke bilaterally; no bruits; no JVD; no thyromegaly chest - CTA/ normal expansion CV - RRR/normal S1 and S2; no murmurs, rubs or gallops;  PMI  nondisplaced Abdomen -NT/ND, no HSM, no mass, + bowel sounds, no bruit 2+ femoral pulses, no bruits Ext-no edema, chords, 2+ DP Neuro-grossly nonfocal  A/P  1 bicuspid aortic valve-previous echocardiogram showed mild aortic insufficiency.  We will plan to repeat study.  2 question history of right MCA ectasia versus aneurysm-schedule follow-up MRA.  3 hypertension-blood pressure controlled.  Continue present medications and follow.  4 tobacco abuse-patient has discontinued for the past 1 week.  I encouraged her to continue to avoid.  5 history of hyperlipidemia-patient did not tolerate  statins previously.  Recent LDL greater than 100 and triglycerides elevated.  I asked her to begin diet and we can recheck in the future.  Olga Millers, MD

## 2019-12-01 ENCOUNTER — Other Ambulatory Visit: Payer: Self-pay | Admitting: Physician Assistant

## 2019-12-01 DIAGNOSIS — I10 Essential (primary) hypertension: Secondary | ICD-10-CM

## 2019-12-04 ENCOUNTER — Other Ambulatory Visit: Payer: Self-pay

## 2019-12-04 ENCOUNTER — Encounter: Payer: Self-pay | Admitting: Cardiology

## 2019-12-04 ENCOUNTER — Ambulatory Visit: Payer: 59 | Admitting: Cardiology

## 2019-12-04 VITALS — BP 140/60 | HR 71 | Temp 97.3°F | Ht 64.0 in | Wt 163.6 lb

## 2019-12-04 DIAGNOSIS — Q231 Congenital insufficiency of aortic valve: Secondary | ICD-10-CM

## 2019-12-04 DIAGNOSIS — I351 Nonrheumatic aortic (valve) insufficiency: Secondary | ICD-10-CM

## 2019-12-04 DIAGNOSIS — I671 Cerebral aneurysm, nonruptured: Secondary | ICD-10-CM

## 2019-12-04 NOTE — Addendum Note (Signed)
Addended by: Freddi Starr on: 12/04/2019 10:59 AM   Modules accepted: Orders

## 2019-12-04 NOTE — Patient Instructions (Signed)
  Testing/Procedures:  Your physician has requested that you have an echocardiogram. Echocardiography is a painless test that uses sound waves to create images of your heart. It provides your doctor with information about the size and shape of your heart and how well your heart's chambers and valves are working. This procedure takes approximately one hour. There are no restrictions for this procedure.[address]  MRA OF THE HEAD W/WO TO FOLLOW UP ANEURYSM @ [address]= [facility]   Follow-Up: At [facility], you and your health needs are our priority.  As part of our continuing mission to provide you with exceptional heart care, we have created designated Provider Care Teams.  These Care Teams include your primary Cardiologist (physician) and Advanced Practice Providers (APPs -  Physician Assistants and Nurse Practitioners) who all work together to provide you with the care you need, when you need it.  We recommend signing up for the patient portal called "MyChart".  Sign up information is provided on this After Visit Summary.  MyChart is used to connect with patients for Virtual Visits (Telemedicine).  Patients are able to view lab/test results, encounter notes, upcoming appointments, etc.  Non-urgent messages can be sent to your provider as well.   To learn more about what you can do with MyChart, go to [email or web address].    Your next appointment:   12 month(s)  The format for your next appointment:   In Person  Provider:   Olga Millers, MD

## 2019-12-13 ENCOUNTER — Other Ambulatory Visit: Payer: Self-pay

## 2019-12-13 ENCOUNTER — Ambulatory Visit
Admission: RE | Admit: 2019-12-13 | Discharge: 2019-12-13 | Disposition: A | Discharge status: Home / Self Care | Payer: 59 | Source: Ambulatory Visit | Attending: Cardiology | Admitting: Cardiology

## 2019-12-13 DIAGNOSIS — I671 Cerebral aneurysm, nonruptured: Secondary | ICD-10-CM

## 2019-12-14 DIAGNOSIS — I671 Cerebral aneurysm, nonruptured: Secondary | ICD-10-CM

## 2019-12-17 ENCOUNTER — Other Ambulatory Visit: Payer: Self-pay | Admitting: *Deleted

## 2019-12-27 ENCOUNTER — Ambulatory Visit (HOSPITAL_COMMUNITY): Payer: 59 | Attending: Internal Medicine

## 2019-12-27 ENCOUNTER — Other Ambulatory Visit: Payer: Self-pay

## 2019-12-27 DIAGNOSIS — I351 Nonrheumatic aortic (valve) insufficiency: Secondary | ICD-10-CM

## 2020-01-03 ENCOUNTER — Encounter: Payer: Self-pay | Admitting: *Deleted

## 2020-01-03 ENCOUNTER — Other Ambulatory Visit: Payer: Self-pay | Admitting: *Deleted

## 2020-01-03 DIAGNOSIS — I671 Cerebral aneurysm, nonruptured: Secondary | ICD-10-CM

## 2020-01-03 NOTE — Progress Notes (Signed)
amb  

## 2020-01-23 ENCOUNTER — Encounter: Payer: Self-pay | Admitting: *Deleted

## 2020-01-23 ENCOUNTER — Other Ambulatory Visit (HOSPITAL_COMMUNITY): Payer: 59

## 2020-02-06 ENCOUNTER — Ambulatory Visit: Payer: 59 | Admitting: Physician Assistant

## 2020-02-06 ENCOUNTER — Encounter: Payer: Self-pay | Admitting: Physician Assistant

## 2020-02-06 ENCOUNTER — Other Ambulatory Visit: Payer: Self-pay

## 2020-02-06 VITALS — BP 118/71 | HR 88 | Ht 64.0 in | Wt 171.7 lb

## 2020-02-06 DIAGNOSIS — E782 Mixed hyperlipidemia: Secondary | ICD-10-CM | POA: Diagnosis not present

## 2020-02-06 DIAGNOSIS — E781 Pure hyperglyceridemia: Secondary | ICD-10-CM

## 2020-02-06 DIAGNOSIS — F410 Panic disorder [episodic paroxysmal anxiety] without agoraphobia: Secondary | ICD-10-CM

## 2020-02-06 DIAGNOSIS — I10 Essential (primary) hypertension: Secondary | ICD-10-CM | POA: Diagnosis not present

## 2020-02-06 DIAGNOSIS — E559 Vitamin D deficiency, unspecified: Secondary | ICD-10-CM

## 2020-02-06 DIAGNOSIS — R7303 Prediabetes: Secondary | ICD-10-CM

## 2020-02-06 DIAGNOSIS — F430 Acute stress reaction: Secondary | ICD-10-CM

## 2020-02-06 DIAGNOSIS — Z716 Tobacco abuse counseling: Secondary | ICD-10-CM

## 2020-02-06 MED ORDER — ALPRAZOLAM 0.5 MG PO TABS: 0.5000 mg | ORAL_TABLET | ORAL | 0 refills | Status: DC | PRN

## 2020-02-06 MED ORDER — BISOPROLOL-HYDROCHLOROTHIAZIDE 10-6.25 MG PO TABS: 1.0000 | ORAL_TABLET | Freq: Every day | ORAL | 1 refills | Status: DC

## 2020-02-06 MED ORDER — VITAMIN D (ERGOCALCIFEROL) 1.25 MG (50000 UNIT) PO CAPS: 50000.0000 [IU] | ORAL_CAPSULE | ORAL | 1 refills | Status: DC

## 2020-02-06 NOTE — Progress Notes (Signed)
Established Patient Office Visit  Subjective:  Patient ID: Mary SermonSusan M Leiterman, female    DOB: 04/28/1960  Age: 59 y.o. MRN: [id number]  CC:  Chief Complaint  Patient presents with  . Hypertension    HPI Mary SermonSusan M Sidor presents for follow up on hypertension. Patient reports has started a new job which has decreased stress levels. Uses Xanax once-twice/month.   HTN: Pt denies new onset chest pain, palpitations, dizziness or lower extremity swelling. States has chronic palpitations (hx of PVCs), no changes from baseline. Taking medication as directed without side effects. Does not check BP at home as used to. Pt tries to stay hydrated.  HLD: Not on statin medication therapy. Reports has not made significant diet changes but plans to join [employer] at the beginning of the year.  Prediabetes: Denies increased thirst or urination.  Tobacco use: Reports has quit smoking for 5 months, which has lead to weight gain. Tries to avoid triggers/cravings.  Past Medical History:  Diagnosis Date  . Aortic insufficiency    a. 10/2013: mod AI by echo.  . Bicuspid aortic valve    a. 10/24/13 showed EF 55-60%, no RWMA, bicuspid AV with mod AI, mildly dilated ascending aorta, PASP 32mmHg.  Marland Kitchen. Cerebral aneurysm   . Chronic diarrhea   . Fibromyalgia   . Gallstones   . GERD (gastroesophageal reflux disease)   . Heart murmur   . Hyperlipidemia   . Hypertension   . Pancreatitis    2 previous episodes, last one 2013  . PVC's (premature ventricular contractions)    a. 10/2013 event monitor: NSR and PVCs.  . Tobacco abuse     Past Surgical History:  Procedure Laterality Date  . ABDOMINAL HYSTERECTOMY    . APPENDECTOMY    . CHOLECYSTECTOMY      Family History  Problem Relation Age of Onset  . CAD Mother        Died at 2373, several blockages but patient unknown when this started  . Aneurysm Mother        Brain aneurysm - died of this during surgery  . Hypertension Mother   . Hyperlipidemia  Mother   . Thyroid disease Mother   . Heart disease Father        Died of MI at age 58 - enlarged heart  . Heart attack Father   . Early death Father   . Diabetes Paternal Grandmother   . Esophageal cancer Cousin   . Ulcerative colitis Daughter   . Diabetes Daughter   . Diabetes Son     Social History   Socioeconomic History  . Marital status: Single    Spouse name: Not on file  . Number of children: 2  . Years of education: Not on file  . Highest education level: Not on file  Occupational History    Employer: [employer]    Comment: Dental office  Tobacco Use  . Smoking status: Former Smoker    Packs/day: 1.00    Years: 42.00    Pack years: 42.00    Types: Cigarettes    Quit date: 01/23/2016    Years since quitting: 4.0  . Smokeless tobacco: Never Used  Vaping Use  . Vaping Use: Never used  Substance and Sexual Activity  . Alcohol use: Yes    Alcohol/week: 0.0 standard drinks    Comment: twice per week - 2-6 beers on each occasion  . Drug use: No  . Sexual activity: Yes  Other Topics Concern  .  Not on file  Social History Narrative   Single mother   Daughter and son, about to adopt a foster child in upcoming 2017   Dental hygienist   Social Determinants of Health   Financial Resource Strain: Not on file  Food Insecurity: Not on file  Transportation Needs: Not on file  Physical Activity: Not on file  Stress: Not on file  Social Connections: Not on file  Intimate Partner Violence: Not on file    Outpatient Medications Prior to Visit  Medication Sig Dispense Refill  . Cholecalciferol (VITAMIN D3) 5000 units CAPS Take 1 capsule (5,000 Units total) by mouth daily. 30 capsule   . ibuprofen (ADVIL,MOTRIN) 200 MG tablet Take 200 mg by mouth every 6 (six) hours as needed for mild pain or moderate pain.    Marland Kitchen omeprazole (PRILOSEC) 20 MG capsule Take 2 capsules (40 mg total) by mouth daily. (Patient taking differently: Take 20 mg by mouth daily.) 30 capsule 11  .  pseudoephedrine-acetaminophen (TYLENOL SINUS) 30-500 MG TABS tablet Take 1 tablet by mouth every 4 (four) hours as needed.    . ALPRAZolam (XANAX) 0.5 MG tablet Take 1 tablet (0.5 mg total) by mouth as needed. 30 tablet 0  . bisoprolol-hydrochlorothiazide (ZIAC) 10-6.25 MG tablet Take 1 tablet by mouth daily. OFFICE VISIT REQUIRED PRIOR TO ANY FURTHER REFILLS 60 tablet 0   No facility-administered medications prior to visit.    Allergies  Allergen Reactions  . Crestor [Rosuvastatin]     Mood changes  . Pravastatin     Muscle aches, elevated LFTs    ROS Review of Systems A fourteen system review of systems was performed and found to be positive as per HPI.  Objective:    Physical Exam General:  Well Developed, well nourished, appropriate for stated age.  Neuro:  Alert and oriented,  extra-ocular muscles intact, no focal deficits  HEENT:  Normocephalic, atraumatic, neck supple, no carotid bruits appreciated  Skin:  no gross rash, warm, pink. Cardiac:  RRR, S1 S2 Respiratory:  ECTA B/L, Not using accessory muscles, speaking in full sentences- unlabored. Vascular:  Ext warm, no cyanosis apprec.; no gross edema Psych:  No HI/SI, judgement and insight good, Euthymic mood. Full Affect.  BP 118/71   Pulse 88   Ht 5\' 4"  (1.626 m)   Wt 171 lb 11.2 oz (77.9 kg)   SpO2 96%   BMI 29.47 kg/m  Wt Readings from Last 3 Encounters:  02/06/20 171 lb 11.2 oz (77.9 kg)  12/04/19 163 lb 9.6 oz (74.2 kg)  05/25/19 162 lb 4.8 oz (73.6 kg)     Health Maintenance Due  Topic Date Due  . COVID-19 Vaccine (1) Never done  . MAMMOGRAM  Never done  . INFLUENZA VACCINE  09/16/2019    There are no preventive care reminders to display for this patient.  Lab Results  Component Value Date   TSH 1.100 10/26/2019   Lab Results  Component Value Date   WBC 7.9 10/26/2019   HGB 13.6 10/26/2019   HCT 41.4 10/26/2019   MCV 91 10/26/2019   PLT 224 10/26/2019   Lab Results  Component Value Date    NA 140 10/26/2019   K 4.4 10/26/2019   CO2 24 10/26/2019   GLUCOSE 114 (H) 10/26/2019   BUN 10 10/26/2019   CREATININE 0.44 (L) 10/26/2019   BILITOT 0.2 10/26/2019   ALKPHOS 76 10/26/2019   AST 16 10/26/2019   ALT 19 10/26/2019   PROT 6.5 10/26/2019  ALBUMIN 4.2 10/26/2019   CALCIUM 9.0 10/26/2019   ANIONGAP 6 10/18/2014   Lab Results  Component Value Date   CHOL 248 (H) 10/26/2019   Lab Results  Component Value Date   HDL 32 (L) 10/26/2019   Lab Results  Component Value Date   LDLCALC 129 (H) 10/26/2019   Lab Results  Component Value Date   TRIG 477 (H) 10/26/2019   Lab Results  Component Value Date   CHOLHDL 7.8 (H) 10/26/2019   Lab Results  Component Value Date   HGBA1C 5.9 (H) 10/26/2019      Assessment & Plan:   Problem List Items Addressed This Visit      Cardiovascular and Mediastinum   Essential hypertension - Primary (Chronic)   Relevant Medications   bisoprolol-hydrochlorothiazide (ZIAC) 10-6.25 MG tablet     Other   Hypertriglyceridemia (Chronic)   Relevant Medications   bisoprolol-hydrochlorothiazide (ZIAC) 10-6.25 MG tablet   Vitamin D deficiency (Chronic)   Relevant Medications   Vitamin D, Ergocalciferol, (DRISDOL) 1.25 MG (50000 UNIT) CAPS capsule   Mixed hyperlipidemia   Relevant Medications   bisoprolol-hydrochlorothiazide (ZIAC) 10-6.25 MG tablet    Other Visit Diagnoses    Panic attack as reaction to stress       Relevant Medications   ALPRAZolam (XANAX) 0.5 MG tablet   Prediabetes       Encounter for tobacco use cessation counseling         Essential hypertension: -Controlled. -Continue current medication regimen.  Provider refill. -Recommend to stay well-hydrated and follow low-sodium diet. -Last CMP: Creatinine 0.44, GFR 112, no electrolyte imbalances -Will continue to monitor.  Mixed hyperlipidemia, hypertriglyceridemia: -Last lipid panel: Total cholesterol 248, triglycerides 477, HDL 32, LDL 129  -Discussed  with patient to follow a heart healthy diet and reduce saturated and transfats as well as simple carbohydrates. -Stay as active as possible. -Will continue to monitor and repeat lipid panel at follow-up visit.  Vitamin D deficiency: -Last vitamin D 26.4, mildly low.  Patient reports compliance with vitamin D3 5000 units, and vitamin D continues to be low so will send prescription for weekly vitamin D. -Will continue to monitor.  Panic attack as reaction to stress: -Stable. -PDMP reviewed, no aberrancies noted.  Xanax last filled 03/12/2019.  Provided refill. -Will continue to monitor.  Prediabetes: -Last A1c 5.9, increased from prior. -Recommend to monitor/reduce carbohydrates and glucose. Encourage to join [employer]. -Recommend to continue to stay as active as possible. -Will continue to monitor.  Encounter for tobacco use cessation counseling: -Smoking cessation instruction/counseling given:  commended patient for quitting and reviewed strategies for preventing relapses    Meds ordered this encounter  Medications  . ALPRAZolam (XANAX) 0.5 MG tablet    Sig: Take 1 tablet (0.5 mg total) by mouth as needed.    Dispense:  30 tablet    Refill:  0  . bisoprolol-hydrochlorothiazide (ZIAC) 10-6.25 MG tablet    Sig: Take 1 tablet by mouth daily.    Dispense:  90 tablet    Refill:  1  . Vitamin D, Ergocalciferol, (DRISDOL) 1.25 MG (50000 UNIT) CAPS capsule    Sig: Take 1 capsule (50,000 Units total) by mouth every 7 (seven) days.    Dispense:  12 capsule    Refill:  1    Order Specific Question:   Supervising Provider    Answer:   Nani Gasser D [[id number]]    Follow-up: Return in about 4 months (around 06/06/2020) for HTN, HLD, PreDM and  FBW (lipid panel, cmp, a1c).   Note:  This note was prepared with assistance of Dragon voice recognition software. Occasional wrong-word or sound-a-like substitutions may have occurred due to the inherent limitations of voice recognition  software.   Mayer Masker, PA-C

## 2020-02-06 NOTE — Patient Instructions (Signed)
High Triglycerides Eating Plan Triglycerides are a type of fat in the blood. High levels of triglycerides can increase your risk of heart disease and stroke. If your triglyceride levels are high, choosing the right foods can help lower your triglycerides and keep your heart healthy. Work with your health care provider or a diet and nutrition specialist (dietitian) to develop an eating plan that is right for you. What are tips for following this plan? General guidelines   Lose weight, if you are overweight. For most people, losing 5-10 lbs (2-5 kg) helps lower triglyceride levels. A weight-loss plan may include. ? 30 minutes of exercise at least 5 days a week. ? Reducing the amount of calories, sugar, and fat you eat.  Eat a wide variety of fresh fruits, vegetables, and whole grains. These foods are high in fiber.  Eat foods that contain healthy fats, such as fatty fish, nuts, seeds, and olive oil.  Avoid foods that are high in added sugar, added salt (sodium), saturated fat, and trans fat.  Avoid low-fiber, refined carbohydrates such as white bread, crackers, noodles, and white rice.  Avoid foods with partially hydrogenated oils (trans fats), such as fried foods or stick margarine.  Limit alcohol intake to no more than 1 drink a day for nonpregnant women and 2 drinks a day for men. One drink equals 12 oz of beer, 5 oz of wine, or 1 oz of hard liquor. Your health care provider may recommend that you drink less depending on your overall health. Reading food labels  Check food labels for the amount of saturated fat. Choose foods with no or very little saturated fat.  Check food labels for the amount of trans fat. Choose foods with no trans fat.  Check food labels for the amount of cholesterol. Choose foods low in cholesterol. Ask your dietitian how much cholesterol you should have each day.  Check food labels for the amount of sodium. Choose foods with less than 140 milligrams (mg) per  serving. Shopping  Buy dairy products labeled as nonfat (skim) or low-fat (1%).  Avoid buying processed or prepackaged foods. These are often high in added sugar, sodium, and fat. Cooking  Choose healthy fats when cooking, such as olive oil or canola oil.  Cook foods using lower fat methods, such as baking, broiling, boiling, or grilling.  Make your own sauces, dressings, and marinades when possible, instead of buying them. Store-bought sauces, dressings, and marinades are often high in sodium and sugar. Meal planning  Eat more home-cooked food and less restaurant, buffet, and fast food.  Eat fatty fish at least 2 times each week. Examples of fatty fish include salmon, trout, mackerel, tuna, and herring.  If you eat whole eggs, do not eat more than 3 egg yolks per week. What foods are recommended? The items listed may not be a complete list. Talk with your dietitian about what dietary choices are best for you. Grains Whole wheat or whole grain breads, crackers, cereals, and pasta. Unsweetened oatmeal. Bulgur. Barley. Quinoa. Brown rice. Whole wheat flour tortillas. Vegetables Fresh or frozen vegetables. Low-sodium canned vegetables. Fruits All fresh, canned (in natural juice), or frozen fruits. Meats and other protein foods Skinless chicken or turkey. Ground chicken or turkey. Lean cuts of pork, trimmed of fat. Fish and seafood, especially salmon, trout, and herring. Egg whites. Dried beans, peas, or lentils. Unsalted nuts or seeds. Unsalted canned beans. Natural peanut or almond butter. Dairy Low-fat dairy products. Skim or low-fat (1%) milk. Reduced fat (  2%) and low-sodium cheese. Low-fat ricotta cheese. Low-fat cottage cheese. Plain, low-fat yogurt. Fats and oils Tub margarine without trans fats. Light or reduced-fat mayonnaise. Light or reduced-fat salad dressings. Avocado. Safflower, olive, sunflower, soybean, and canola oils. What foods are not recommended? The items listed  may not be a complete list. Talk with your dietitian about what dietary choices are best for you. Grains White bread. White (regular) pasta. White rice. Cornbread. Bagels. Pastries. Crackers that contain trans fat. Vegetables Creamed or fried vegetables. Vegetables in a cheese sauce. Fruits Sweetened dried fruit. Canned fruit in syrup. Fruit juice. Meats and other protein foods Fatty cuts of meat. Ribs. Chicken wings. Bacon. Sausage. Bologna. Salami. Chitterlings. Fatback. Hot dogs. Bratwurst. Packaged lunch meats. Dairy Whole or reduced-fat (2%) milk. Half-and-half. Cream cheese. Full-fat or sweetened yogurt. Full-fat cheese. Nondairy creamers. Whipped toppings. Processed cheese or cheese spreads. Cheese curds. Beverages Alcohol. Sweetened drinks, such as soda, lemonade, fruit drinks, or punches. Fats and oils Butter. Stick margarine. Lard. Shortening. Ghee. Bacon fat. Tropical oils, such as coconut, palm kernel, or palm oils. Sweets and desserts Corn syrup. Sugars. Honey. Molasses. Candy. Jam and jelly. Syrup. Sweetened cereals. Cookies. Pies. Cakes. Donuts. Muffins. Ice cream. Condiments Store-bought sauces, dressings, and marinades that are high in sugar, such as ketchup and barbecue sauce. Summary  High levels of triglycerides can increase the risk of heart disease and stroke. Choosing the right foods can help lower your triglycerides.  Eat plenty of fresh fruits, vegetables, and whole grains. Choose low-fat dairy and lean meats. Eat fatty fish at least twice a week.  Avoid processed and prepackaged foods with added sugar, sodium, saturated fat, and trans fat.  If you need suggestions or have questions about what types of food are good for you, talk with your health care provider or a dietitian. This information is not intended to replace advice given to you by your health care provider. Make sure you discuss any questions you have with your health care provider. Document Revised:  01/14/2017 Document Reviewed: 04/06/2016 Elsevier Patient Education  2020 Elsevier Inc.  

## 2020-02-11 ENCOUNTER — Other Ambulatory Visit: Payer: Self-pay

## 2020-02-11 ENCOUNTER — Ambulatory Visit (HOSPITAL_COMMUNITY): Payer: 59 | Attending: Cardiology

## 2020-02-11 DIAGNOSIS — I351 Nonrheumatic aortic (valve) insufficiency: Secondary | ICD-10-CM

## 2020-02-11 LAB — ECHOCARDIOGRAM COMPLETE
AR max vel: 2.17 cm2
AV Area VTI: 2.36 cm2
AV Area mean vel: 2.37 cm2
AV Mean grad: 10.5 mmHg
AV Peak grad: 20.9 mmHg
Ao pk vel: 2.29 m/s
Area-P 1/2: 3.08 cm2
P 1/2 time: 378 msec
S' Lateral: 3.3 cm

## 2020-02-13 ENCOUNTER — Other Ambulatory Visit: Payer: Self-pay | Admitting: *Deleted

## 2020-02-13 ENCOUNTER — Telehealth: Payer: Self-pay | Admitting: *Deleted

## 2020-02-13 DIAGNOSIS — I712 Thoracic aortic aneurysm, without rupture, unspecified: Secondary | ICD-10-CM

## 2020-02-13 DIAGNOSIS — Q231 Congenital insufficiency of aortic valve: Secondary | ICD-10-CM

## 2020-02-13 NOTE — Telephone Encounter (Signed)
-----   Message from Lewayne Bunting, MD sent at 02/11/2020 11:34 AM EST ----- FU echo one year; CTA of thoracic aorta for dilatation Olga Millers

## 2020-02-13 NOTE — Telephone Encounter (Signed)
Spoke with pt, aware of echo results. Echo and CTA orders placed. CTA scheduled 03-04-20 @ 9:20 am message sent to patient via my chart as she was driving. She may call and reschedule to later time in the day.

## 2020-02-23 LAB — BASIC METABOLIC PANEL
BUN/Creatinine Ratio: 31 — ABNORMAL HIGH (ref 9–23)
BUN: 13 mg/dL (ref 6–24)
CO2: 25 mmol/L (ref 20–29)
Calcium: 8.8 mg/dL (ref 8.7–10.2)
Chloride: 104 mmol/L (ref 96–106)
Creatinine, Ser: 0.42 mg/dL — ABNORMAL LOW (ref 0.57–1.00)
GFR calc Af Amer: 131 mL/min/{1.73_m2} (ref >59)
GFR calc non Af Amer: 113 mL/min/{1.73_m2} (ref >59)
Glucose: 107 mg/dL — ABNORMAL HIGH (ref 65–99)
Potassium: 4.4 mmol/L (ref 3.5–5.2)
Sodium: 141 mmol/L (ref 134–144)

## 2020-03-04 ENCOUNTER — Other Ambulatory Visit: Payer: 59

## 2020-03-18 ENCOUNTER — Other Ambulatory Visit: Payer: Self-pay

## 2020-03-18 ENCOUNTER — Telehealth: Payer: Self-pay | Admitting: Physician Assistant

## 2020-03-18 ENCOUNTER — Encounter: Payer: Self-pay | Admitting: Physician Assistant

## 2020-03-18 ENCOUNTER — Ambulatory Visit (INDEPENDENT_AMBULATORY_CARE_PROVIDER_SITE_OTHER): Payer: 59 | Admitting: Physician Assistant

## 2020-03-18 VITALS — BP 130/73 | HR 71 | Ht 64.0 in | Wt 171.5 lb

## 2020-03-18 DIAGNOSIS — H6983 Other specified disorders of Eustachian tube, bilateral: Secondary | ICD-10-CM

## 2020-03-18 DIAGNOSIS — J069 Acute upper respiratory infection, unspecified: Secondary | ICD-10-CM | POA: Diagnosis not present

## 2020-03-18 MED ORDER — PREDNISONE 20 MG PO TABS
ORAL_TABLET | ORAL | 0 refills | Status: DC
Start: 2020-03-18 — End: 2020-04-10

## 2020-03-18 MED ORDER — FLUTICASONE PROPIONATE 50 MCG/ACT NA SUSP: 2.0000 | Freq: Every day | NASAL | 6 refills | Status: DC

## 2020-03-18 NOTE — Progress Notes (Signed)
Acute Office Visit  Subjective:    Patient ID: Mary Wade, female    DOB: [birth year], 60 y.o.   MRN: [id number]  Chief Complaint  Patient presents with  . Ear Pain    HPI Patient is in today with complaints of bilateral ear fullness (ears pop) , nasal congestion, dizziness and mild cough for 3 weeks.  Patient reports she went to urgent care and was tested for Covid which was negative.  She was given amoxicillin 800 mg for 10 days and recently completed antibiotic.  Has been taking Chlortab and Advil with minimal relief. Denies sore throat, fever, chills or wheezing. Does have some mild shortness of breath with wearing mask.  Past Medical History:  Diagnosis Date  . Aortic insufficiency    a. 10/2013: mod AI by echo.  . Bicuspid aortic valve    a. 10/24/13 showed EF 55-60%, no RWMA, bicuspid AV with mod AI, mildly dilated ascending aorta, PASP .  Marland Kitchen Cerebral aneurysm   . Chronic diarrhea   . Fibromyalgia   . Gallstones   . GERD (gastroesophageal reflux disease)   . Heart murmur   . Hyperlipidemia   . Hypertension   . Pancreatitis    2 previous episodes, last one 2013  . PVC's (premature ventricular contractions)    a. 10/2013 event monitor: NSR and PVCs.  . Tobacco abuse     Past Surgical History:  Procedure Laterality Date  . ABDOMINAL HYSTERECTOMY    . APPENDECTOMY    . CHOLECYSTECTOMY      Family History  Problem Relation Age of Onset  . CAD Mother        Died at 6, several blockages but patient unknown when this started  . Aneurysm Mother        Brain aneurysm - died of this during surgery  . Hypertension Mother   . Hyperlipidemia Mother   . Thyroid disease Mother   . Heart disease Father        Died of MI at age 70 - enlarged heart  . Heart attack Father   . Early death Father   . Diabetes Paternal Grandmother   . Esophageal cancer Cousin   . Ulcerative colitis Daughter   . Diabetes Daughter   . Diabetes Son     Social History   Socioeconomic  History  . Marital status: Single    Spouse name: Not on file  . Number of children: 2  . Years of education: Not on file  . Highest education level: Not on file  Occupational History    Employer: [employer]    Comment: Dental office  Tobacco Use  . Smoking status: Former Smoker    Packs/day: 1.00    Years: 42.00    Pack years: 42.00    Types: Cigarettes    Quit date: 01/23/2016    Years since quitting: 4.1  . Smokeless tobacco: Never Used  Vaping Use  . Vaping Use: Never used  Substance and Sexual Activity  . Alcohol use: Yes    Alcohol/week: 0.0 standard drinks    Comment: twice per week - 2-6 beers on each occasion  . Drug use: No  . Sexual activity: Yes  Other Topics Concern  . Not on file  Social History Narrative   Single mother   Daughter and son, about to adopt a foster child in upcoming 2017   Dental hygienist   Social Determinants of Health   Financial Resource Strain: Not on file  Food Insecurity: Not on file  Transportation Needs: Not on file  Physical Activity: Not on file  Stress: Not on file  Social Connections: Not on file  Intimate Partner Violence: Not on file    Outpatient Medications Prior to Visit  Medication Sig Dispense Refill  . ALPRAZolam (XANAX) 0.5 MG tablet Take 1 tablet (0.5 mg total) by mouth as needed. 30 tablet 0  . bisoprolol-hydrochlorothiazide (ZIAC) 10-6.25 MG tablet Take 1 tablet by mouth daily. 90 tablet 1  . Cholecalciferol (VITAMIN D3) 5000 units CAPS Take 1 capsule (5,000 Units total) by mouth daily. 30 capsule   . ibuprofen (ADVIL,MOTRIN) 200 MG tablet Take 200 mg by mouth every 6 (six) hours as needed for mild pain or moderate pain.    Marland Kitchen omeprazole (PRILOSEC) 20 MG capsule Take 2 capsules (40 mg total) by mouth daily. (Patient taking differently: Take 20 mg by mouth daily.) 30 capsule 11  . pseudoephedrine-acetaminophen (TYLENOL SINUS) 30-500 MG TABS tablet Take 1 tablet by mouth every 4 (four) hours as needed.    . Vitamin  D, Ergocalciferol, (DRISDOL) 1.25 MG (50000 UNIT) CAPS capsule Take 1 capsule (50,000 Units total) by mouth every 7 (seven) days. 12 capsule 1   No facility-administered medications prior to visit.    Allergies  Allergen Reactions  . Crestor [Rosuvastatin]     Mood changes  . Pravastatin     Muscle aches, elevated LFTs    Review of Systems  Constitutional: Negative for chills and fever.  HENT: Positive for congestion, sinus pressure and tinnitus. Negative for ear discharge, ear pain, rhinorrhea, sneezing, sore throat and trouble swallowing.   Eyes: Negative for pain, discharge and itching.  Respiratory: Positive for cough and shortness of breath. Negative for wheezing and stridor.   Cardiovascular: Negative for chest pain.  Musculoskeletal: Negative for arthralgias, joint swelling and myalgias.  Skin: Negative for color change, rash and wound.  Allergic/Immunologic: Positive for environmental allergies.  Neurological: Negative for syncope, speech difficulty and numbness.  Hematological: Negative for adenopathy.  Psychiatric/Behavioral: Negative for agitation and confusion.       Objective:    Physical Exam Constitutional:      General: She is not in acute distress.    Appearance: She is not toxic-appearing.  HENT:     Head: Normocephalic.     Right Ear: Ear canal and external ear normal. Tenderness present. No drainage. A middle ear effusion is present. There is no impacted cerumen. No foreign body. No mastoid tenderness. Tympanic membrane is not erythematous or bulging.     Left Ear: Ear canal and external ear normal. Tenderness present. No drainage. A middle ear effusion is present. There is no impacted cerumen. No foreign body. No mastoid tenderness. Tympanic membrane is not erythematous or bulging.     Nose: Congestion present.     Mouth/Throat:     Pharynx: Oropharynx is clear. No oropharyngeal exudate or posterior oropharyngeal erythema.  Eyes:     Extraocular  Movements: Extraocular movements intact.     Conjunctiva/sclera: Conjunctivae normal.  Cardiovascular:     Rate and Rhythm: Normal rate and regular rhythm.     Pulses: Normal pulses.     Heart sounds: Normal heart sounds.  Pulmonary:     Effort: Pulmonary effort is normal. No respiratory distress.     Breath sounds: Normal breath sounds. No stridor. No wheezing, rhonchi or rales.  Musculoskeletal:        General: Normal range of motion.     Cervical  back: Normal range of motion and neck supple.  Lymphadenopathy:     Cervical: No cervical adenopathy.  Skin:    General: Skin is warm and dry.  Neurological:     General: No focal deficit present.     Mental Status: She is alert.     BP 130/73   Pulse 71   Ht 5\' 4"  (1.626 m)   Wt 171 lb 8 oz (77.8 kg)   SpO2 98%   BMI 29.44 kg/m  Wt Readings from Last 3 Encounters:  03/18/20 171 lb 8 oz (77.8 kg)  02/06/20 171 lb 11.2 oz (77.9 kg)  12/04/19 163 lb 9.6 oz (74.2 kg)    Health Maintenance Due  Topic Date Due  . COVID-19 Vaccine (1) Never done  . MAMMOGRAM  Never done  . INFLUENZA VACCINE  09/16/2019    There are no preventive care reminders to display for this patient.   Lab Results  Component Value Date   TSH 1.100 10/26/2019   Lab Results  Component Value Date   WBC 7.9 10/26/2019   HGB 13.6 10/26/2019   HCT 41.4 10/26/2019   MCV 91 10/26/2019   PLT 224 10/26/2019   Lab Results  Component Value Date   NA 141 02/22/2020   K 4.4 02/22/2020   CO2 25 02/22/2020   GLUCOSE 107 (H) 02/22/2020   BUN 13 02/22/2020   CREATININE 0.42 (L) 02/22/2020   BILITOT 0.2 10/26/2019   ALKPHOS 76 10/26/2019   AST 16 10/26/2019   ALT 19 10/26/2019   PROT 6.5 10/26/2019   ALBUMIN 4.2 10/26/2019   CALCIUM 8.8 02/22/2020   ANIONGAP 6 10/18/2014   Lab Results  Component Value Date   CHOL 248 (H) 10/26/2019   Lab Results  Component Value Date   HDL 32 (L) 10/26/2019   Lab Results  Component Value Date   LDLCALC 129  (H) 10/26/2019   Lab Results  Component Value Date   TRIG 477 (H) 10/26/2019   Lab Results  Component Value Date   CHOLHDL 7.8 (H) 10/26/2019   Lab Results  Component Value Date   HGBA1C 5.9 (H) 10/26/2019       Assessment & Plan:   Problem List Items Addressed This Visit   None   Visit Diagnoses    Upper respiratory tract infection, unspecified type    -  Primary   Relevant Medications   predniSONE (DELTASONE) 20 MG tablet   fluticasone (FLONASE) 50 MCG/ACT nasal spray   Dysfunction of both eustachian tubes       Relevant Medications   predniSONE (DELTASONE) 20 MG tablet   fluticasone (FLONASE) 50 MCG/ACT nasal spray     Upper respiratory tract infection, unspecified type, Dysfunction of both eustachian tubes: -Patient recently completed antibiotic therapy for 10 days and no signs of otitis media present on exam so recommend holding off on starting new antibiotic therapy. Symptoms have been ongoing for >10 days with minimal improvement so will start short course of corticosteroid therapy and Flonase. -Recommend to continue home supportive care, use a Nettie pot or nasal saline, and try a decongestant for a few days (BP is well controlled). -If symptoms fail to improve or worsen then will consider starting new antibiotic therapy.  Meds ordered this encounter  Medications  . predniSONE (DELTASONE) 20 MG tablet    Sig: 2 tabs by mouth for 2 days, 1 tab by mouth 2 days, 1/2 tab by mouth 2 days    Dispense:  7  tablet    Refill:  0  . fluticasone (FLONASE) 50 MCG/ACT nasal spray    Sig: Place 2 sprays into both nostrils daily.    Dispense:  16 g    Refill:  6     Mayer Masker, PA-C

## 2020-03-18 NOTE — Patient Instructions (Signed)

## 2020-03-18 NOTE — Telephone Encounter (Signed)
Per Kandis Cocking please contact patient and add to schedule for tomorrow 03/19/20 at 8am. Have pt arrive 5 min early for check in. AS, CMA

## 2020-03-21 ENCOUNTER — Ambulatory Visit
Admission: RE | Admit: 2020-03-21 | Discharge: 2020-03-21 | Disposition: A | Discharge status: Home / Self Care | Payer: 59 | Source: Ambulatory Visit | Attending: Cardiology | Admitting: Cardiology

## 2020-03-21 DIAGNOSIS — I712 Thoracic aortic aneurysm, without rupture, unspecified: Secondary | ICD-10-CM

## 2020-03-21 MED ORDER — IOPAMIDOL (ISOVUE-370) INJECTION 76%
75.0000 mL | Freq: Once | INTRAVENOUS | Status: AC | PRN
  Administered 2020-03-21: 75 mL via INTRAVENOUS

## 2020-03-31 ENCOUNTER — Telehealth: Payer: Self-pay | Admitting: Physician Assistant

## 2020-03-31 NOTE — Telephone Encounter (Signed)
Patient denies SOB or difficulty breathing. Patient stats 96%. Patient given information to schedule on-demand video visit with [facility]. Pt advised we could not refer to Covid antibody clinic bc she has not had PCR send off covid testing done. Patient verbalized understanding.

## 2020-03-31 NOTE — Telephone Encounter (Signed)
Patient states she has tested positive for COVID as of yesterday. She did two home test kits and they were both positive. She would like a referral for an antibody infusion, and also asked about a medication called  I believe "hydrochlorifin". Thanks

## 2020-04-07 ENCOUNTER — Telehealth: Payer: Self-pay | Admitting: Physician Assistant

## 2020-04-07 NOTE — Telephone Encounter (Signed)
Please contact pt to add to acute slot with Maritza for this wk. Patient can come into office. We will wait to give note to return to work until she is evaluated by provider. AS, CMA

## 2020-04-07 NOTE — Telephone Encounter (Signed)
Patient states she was COVID positive ten days ago. Patient went to [facility] for treatment. Patient was given prednisone, vitamins, and zinc. Patient will need a note to return for work. Patient is still not feeling well and has no energy. Patient wants to know if she can follow up with [place]. Please advise, thanks.

## 2020-04-08 ENCOUNTER — Encounter: Payer: Self-pay | Admitting: Emergency Medicine

## 2020-04-08 ENCOUNTER — Ambulatory Visit
Admission: EM | Admit: 2020-04-08 | Discharge: 2020-04-08 | Disposition: A | Discharge status: Home / Self Care | Payer: 59 | Attending: Family Medicine | Admitting: Family Medicine

## 2020-04-08 ENCOUNTER — Other Ambulatory Visit: Payer: Self-pay

## 2020-04-08 ENCOUNTER — Ambulatory Visit (INDEPENDENT_AMBULATORY_CARE_PROVIDER_SITE_OTHER): Payer: 59

## 2020-04-08 DIAGNOSIS — R110 Nausea: Secondary | ICD-10-CM

## 2020-04-08 DIAGNOSIS — R079 Chest pain, unspecified: Secondary | ICD-10-CM | POA: Diagnosis not present

## 2020-04-08 DIAGNOSIS — U071 COVID-19: Secondary | ICD-10-CM

## 2020-04-08 DIAGNOSIS — H9203 Otalgia, bilateral: Secondary | ICD-10-CM

## 2020-04-08 DIAGNOSIS — R0789 Other chest pain: Secondary | ICD-10-CM | POA: Diagnosis not present

## 2020-04-08 DIAGNOSIS — R197 Diarrhea, unspecified: Secondary | ICD-10-CM

## 2020-04-08 MED ORDER — ONDANSETRON HCL 4 MG PO TABS: 4.0000 mg | ORAL_TABLET | Freq: Four times a day (QID) | ORAL | 0 refills | Status: DC

## 2020-04-08 MED ORDER — FAMOTIDINE 40 MG PO TABS: 40.0000 mg | ORAL_TABLET | Freq: Every day | ORAL | 0 refills | Status: DC

## 2020-04-08 NOTE — ED Provider Notes (Incomplete)
[name]    CSN: [id number] Arrival date & time: 04/08/20  1312      History   Chief Complaint Chief Complaint  Patient presents with  . Chest Pain  . Diarrhea    HPI Mary SermonSusan M Wade is a 60 y.o. female.   HPI Patient newly diagnosed with COVID-19 on 03/31/2020 presents today for evaluation of chest burning, diarrhea, ear pain, shortness of breath and fatigue along with nausea. She has been treated with two recent rounds of prednisone, two courses of antibiotics (all records not available or view on Care everywhere-[facility] She is concern regarding the aforementioned symptoms. She is afebrile. She has multiple underlying comorbid . She was symptomatics of COVID symptoms first week of February, however did not test positive until 2/14.  Past Medical History:  Diagnosis Date  . Aortic insufficiency    a. 10/2013: mod AI by echo.  . Bicuspid aortic valve    a. 10/24/13 showed EF 55-60%, no RWMA, bicuspid AV with mod AI, mildly dilated ascending aorta, PASP 32mmHg.  Mary Wade. Cerebral aneurysm   . Chronic diarrhea   . Fibromyalgia   . Gallstones   . GERD (gastroesophageal reflux disease)   . Heart murmur   . Hyperlipidemia   . Hypertension   . Pancreatitis    2 previous episodes, last one 2013  . PVC's (premature ventricular contractions)    a. 10/2013 event monitor: NSR and PVCs.  . Tobacco abuse     Patient Active Problem List   Diagnosis Date Noted  . Allergic sinusitis 03/12/2019  . Sleep difficulties 03/12/2019  . Personal history of noncompliance with medical treatment, presenting hazards to health 03/12/2019  . Smoker unmotivated to quit 03/12/2019  . Otitis media 10/10/2018  . Rhinosinusitis 10/10/2018  . Vitamin D insufficiency 08/11/2018  . Dysuria 10/18/2017  . Flank pain 10/18/2017  . Abnormal urinalysis 10/18/2017  . Loose stools 10/18/2017  . Nasal drainage 05/18/2017  . Acute frontal sinusitis 05/18/2017  .  Vitamin B12 nutritional deficiency 07/02/2016  . Hypertriglyceridemia 12/26/2015  . Vitamin D deficiency 12/26/2015  . Low HDL (under 40) 12/26/2015  . History of alcohol use disorder 12/26/2015  . h/o PVC's (premature ventricular contractions) 11/01/2015  . dx in past w/ Fibromyalgia 11/01/2015  . Aortic insufficiency 11/01/2015  . Anxiety state 11/01/2015  . Emotional crisis as acute reaction to exceptional (gross) stress 11/01/2015  . GERD (gastroesophageal reflux disease) 10/31/2015  . Gallstone Pancreatitis 10/31/2015  . Diarrhea- 2nd to GB removal 10/31/2015  . Tobacco abuse counseling 10/31/2015  . Neuropathy 05/23/2015  . Arthritis of hand 05/23/2015  . Cerebral aneurysm 01/24/2015  . Right carotid bruit 10/17/2014  . h/o Chest pains 10/17/2014  . Palpitations 09/28/2013  . Bicuspid aortic valve 09/28/2013  . Mixed hyperlipidemia 09/28/2013  . Tobacco abuse 09/28/2013  . Essential hypertension 09/28/2013    Past Surgical History:  Procedure Laterality Date  . ABDOMINAL HYSTERECTOMY    . APPENDECTOMY    . CHOLECYSTECTOMY      OB History   No obstetric history on file.      Home Medications    Prior to Admission medications   Medication Sig Start Date End Date Taking? Authorizing Provider  ALPRAZolam Prudy Feeler(XANAX) 0.5 MG tablet Take 1 tablet (0.5 mg total) by mouth as needed. 02/06/20   Mayer MaskerAbonza, Maritza, PA-C  bisoprolol-hydrochlorothiazide (ZIAC) 10-6.25 MG tablet Take 1 tablet by mouth daily. 02/06/20   Mayer MaskerAbonza, Maritza, PA-C  Cholecalciferol (VITAMIN  D3) 5000 units CAPS Take 1 capsule (5,000 Units total) by mouth daily. 10/08/16   Opalski, Deborah, DO  fluticasone (FLONASE) 50 MCG/ACT nasal spray Place 2 sprays into both nostrils daily. 03/18/20   Mayer Masker, PA-C  ibuprofen (ADVIL,MOTRIN) 200 MG tablet Take 200 mg by mouth every 6 (six) hours as needed for mild pain or moderate pain.    [provider]  omeprazole (PRILOSEC) 20 MG capsule Take 2 capsules  (40 mg total) by mouth daily. Patient taking differently: Take 20 mg by mouth daily. 11/07/14   Barrett, Joline Salt, PA-C  predniSONE (DELTASONE) 20 MG tablet 2 tabs by mouth for 2 days, 1 tab by mouth 2 days, 1/2 tab by mouth 2 days 03/18/20   Mayer Masker, PA-C  pseudoephedrine-acetaminophen (TYLENOL SINUS) 30-500 MG TABS tablet Take 1 tablet by mouth every 4 (four) hours as needed.    [provider]  Vitamin D, Ergocalciferol, (DRISDOL) 1.25 MG (50000 UNIT) CAPS capsule Take 1 capsule (50,000 Units total) by mouth every 7 (seven) days. 02/06/20   Mayer Masker, PA-C    Family History Family History  Problem Relation Age of Onset  . CAD Mother        Died at 71, several blockages but patient unknown when this started  . Aneurysm Mother        Brain aneurysm - died of this during surgery  . Hypertension Mother   . Hyperlipidemia Mother   . Thyroid disease Mother   . Heart disease Father        Died of MI at age 81 - enlarged heart  . Heart attack Father   . Early death Father   . Diabetes Paternal Grandmother   . Esophageal cancer Cousin   . Ulcerative colitis Daughter   . Diabetes Daughter   . Diabetes Son     Social History Social History   Tobacco Use  . Smoking status: Former Smoker    Packs/day: 1.00    Years: 42.00    Pack years: 42.00    Types: Cigarettes    Quit date: 01/23/2016    Years since quitting: 4.2  . Smokeless tobacco: Never Used  Vaping Use  . Vaping Use: Never used  Substance Use Topics  . Alcohol use: Yes    Alcohol/week: 0.0 standard drinks    Comment: twice per week - 2-6 beers on each occasion  . Drug use: No     Allergies   Crestor [rosuvastatin] and Pravastatin   Review of Systems Review of Systems Pertinent negatives listed in HPI  Physical Exam Triage Vital Signs ED Triage Vitals  Enc Vitals Group     BP 04/08/20 1410 122/77     Pulse Rate 04/08/20 1325 76     Resp 04/08/20 1410 19     Temp 04/08/20 1410 97.8 F  (36.6 C)     Temp src --      SpO2 04/08/20 1325 95 %     Weight --      Height --      Head Circumference --      Peak Flow --      Pain Score 04/08/20 1408 5     Pain Loc --      Pain Edu? --      Excl. in GC? --    No data found.  Updated Vital Signs BP 122/77 (BP Location: Right Arm)   Pulse 78   Temp 97.8 F (36.6 C)   Resp  19   SpO2 94%   Visual Acuity Right Eye Distance:   Left Eye Distance:   Bilateral Distance:    Right Eye Near:   Left Eye Near:    Bilateral Near:     Physical Exam General appearance: alert, Ill-appearing, no distress Head: Normocephalic, without obvious abnormality, atraumatic ENT: Bilateral Ears external and internal normal,  Nares: mucosal edema, congestion, oropharynx normalw/o exudate Respiratory: Respirations even, unlabored, coarse lung sound (upper bronchials), expiratory wheeze Heart:Rate and rhythm normal.  Abdomen: BS +, no distention, no rebound tenderness, or no mass Neurological: GCS 15, normal coordination, normal gait Extremities: No gross deformities Skin: Skin color, texture, turgor normal. No rashes seen  Psych: Appropriate mood and affect.   UC Treatments / Results  Labs (all labs ordered are listed, but only abnormal results are displayed) Labs Reviewed - No data to display  EKG NSR, 67 BPM , No ST changes   Radiology DG Chest 2 View  Result Date: 04/08/2020 CLINICAL DATA:  Chest from COVID positive. EXAM: CHEST - 2 VIEW COMPARISON:  CT 03/21/2020. FINDINGS: Mediastinum and hilar structures normal. Heart size normal. No focal infiltrate. No pleural effusion or pneumothorax. Degenerative change thoracic spine. IMPRESSION: No acute cardiopulmonary disease. Electronically Signed   By: Maisie Fus  Register   On: 04/08/2020 14:56   Procedures Procedures (including critical care time)  Medications Ordered in UC Medications - No data to display  Initial Impression / Assessment and Plan / UC Course  I have reviewed  the triage vital signs and the nursing notes.  Pertinent labs & imaging results that were available during my care of the patient were reviewed by me and considered in my medical decision making (see chart for details).    ECG no acute ST changes. CXR negative. Suspect chest burning related to acid indigestion given patient recently treated with two rounds of oral steroids and antibioticus suspect this has resulted in flare of acid reflux. Bilateral ear pain no evidence of infection, suspect eustachian tube dysfunction. Given recent two rounds of prednisone,treatment recommendations for ET dysfunction except oral antihistamines and Flonase. Zofran for nausea. PCP and ER precautions given. Patient verbalized understanding and agreement of plan. Final Clinical Impressions(s) / UC Diagnoses   Final diagnoses:  Burning chest pain  COVID-19 virus infection  Ear pain, bilateral  Nausea without vomiting  Diarrhea, unspecified type     Discharge Instructions     Hold your omeprazole and start famotidine 40 mg once daily and continue over the next 7 days.  For nausea take Zofran as prescribed and as needed. Suspect diarrhea and chest burning or related to increased acid indigestion from prolonged use of prednisone and also can be associated with an acute COVID-19 viral infection.  Therefore take medication as prescribed.  After completion of famotidine resume use of omeprazole daily.  Your work-up today was normal.  If any of your symptoms become severe or you develop any shortness of breath or severe chest pain go immediately to the emergency department for further work-up and evaluation.    ED Prescriptions    Medication Sig Dispense Auth. Provider   famotidine (PEPCID) 40 MG tablet Take 1 tablet (40 mg total) by mouth daily for 5 days. 5 tablet Bing Neighbors, FNP   ondansetron (ZOFRAN) 4 MG tablet Take 1 tablet (4 mg total) by mouth every 6 (six) hours. 12 tablet Bing Neighbors, FNP      PDMP not reviewed this encounter.   Bing Neighbors, FNP  04/11/20 1539    Bing Neighbors, FNP 04/11/20 [phone]

## 2020-04-08 NOTE — Discharge Instructions (Addendum)
Hold your omeprazole and start famotidine 40 mg once daily and continue over the next 7 days.  For nausea take Zofran as prescribed and as needed. Suspect diarrhea and chest burning or related to increased acid indigestion from prolonged use of prednisone and also can be associated with an acute COVID-19 viral infection.  Therefore take medication as prescribed.  After completion of famotidine resume use of omeprazole daily.  Your work-up today was normal.  If any of your symptoms become severe or you develop any shortness of breath or severe chest pain go immediately to the emergency department for further work-up and evaluation.

## 2020-04-08 NOTE — ED Triage Notes (Signed)
Patient c/o "chest burning when breathing in" x 1 day .   Patient endorses she was diagnosed with COVID-19 on 2/13.   Patient endorses fatigue and nausea. Patient states "nausea woke me up out of my sleep".   Patient endorses that she has been able to "keep food down".   Patient has taken Ibuprofen w/ no relief of symptoms.    Patient c/o diarrhea x 10 days.   Patient endorses about 8 episodes of diarrhea daily.

## 2020-04-10 ENCOUNTER — Ambulatory Visit (INDEPENDENT_AMBULATORY_CARE_PROVIDER_SITE_OTHER): Payer: 59 | Admitting: Physician Assistant

## 2020-04-10 ENCOUNTER — Encounter: Payer: Self-pay | Admitting: Physician Assistant

## 2020-04-10 ENCOUNTER — Other Ambulatory Visit: Payer: Self-pay

## 2020-04-10 VITALS — BP 120/71 | HR 80 | Temp 98.5°F | Ht 64.0 in | Wt 169.2 lb

## 2020-04-10 DIAGNOSIS — U071 COVID-19: Secondary | ICD-10-CM

## 2020-04-10 DIAGNOSIS — R0602 Shortness of breath: Secondary | ICD-10-CM | POA: Diagnosis not present

## 2020-04-10 MED ORDER — ALBUTEROL SULFATE HFA 108 (90 BASE) MCG/ACT IN AERS: 2.0000 | INHALATION_SPRAY | Freq: Four times a day (QID) | RESPIRATORY_TRACT | 0 refills | Status: AC | PRN

## 2020-04-10 NOTE — Progress Notes (Signed)
Acute Office Visit  Subjective:    Patient ID: Mary Wade, female    DOB: [birth year], 60 y.o.   MRN: [id number]  Chief Complaint  Patient presents with  . Covid Positive    HPI Patient is in today for follow up on Covid-19 infection.  Patient reports was diagnosed with Covid about 2 weeks ago.  Went to [facility] and was given azithromycin and prednisone taper.  States started feeling better and then felt bad again.  Went to urgent care 2 days ago for burning chest sensation, diarrhea and shortness of breath.  Shortness of breath has mildly improved, continues to feel tired and has mental fog.  Has been monitoring oxygen saturations at home which have been 93% or higher.  Has noticed her pulse in the mornings is elevated such as in the 120s which improves and lowers throughout the day.  Was advised to hold omeprazole and start famotidine.  Past Medical History:  Diagnosis Date  . Aortic insufficiency    a. 10/2013: mod AI by echo.  . Bicuspid aortic valve    a. 10/24/13 showed EF 55-60%, no RWMA, bicuspid AV with mod AI, mildly dilated ascending aorta, PASP .  Marland Kitchen Cerebral aneurysm   . Chronic diarrhea   . Fibromyalgia   . Gallstones   . GERD (gastroesophageal reflux disease)   . Heart murmur   . Hyperlipidemia   . Hypertension   . Pancreatitis    2 previous episodes, last one 2013  . PVC's (premature ventricular contractions)    a. 10/2013 event monitor: NSR and PVCs.  . Tobacco abuse     Past Surgical History:  Procedure Laterality Date  . ABDOMINAL HYSTERECTOMY    . APPENDECTOMY    . CHOLECYSTECTOMY      Family History  Problem Relation Age of Onset  . CAD Mother        Died at 56, several blockages but patient unknown when this started  . Aneurysm Mother        Brain aneurysm - died of this during surgery  . Hypertension Mother   . Hyperlipidemia Mother   . Thyroid disease Mother   . Heart disease Father        Died of MI at age 68 -  enlarged heart  . Heart attack Father   . Early death Father   . Diabetes Paternal Grandmother   . Esophageal cancer Cousin   . Ulcerative colitis Daughter   . Diabetes Daughter   . Diabetes Son     Social History   Socioeconomic History  . Marital status: Single    Spouse name: Not on file  . Number of children: 2  . Years of education: Not on file  . Highest education level: Not on file  Occupational History    Employer: [employer]    Comment: Dental office  Tobacco Use  . Smoking status: Former Smoker    Packs/day: 1.00    Years: 42.00    Pack years: 42.00    Types: Cigarettes    Quit date: 01/23/2016    Years since quitting: 4.2  . Smokeless tobacco: Never Used  Vaping Use  . Vaping Use: Never used  Substance and Sexual Activity  . Alcohol use: Yes    Alcohol/week: 0.0 standard drinks    Comment: twice per week - 2-6 beers on each occasion  . Drug use: No  . Sexual activity: Yes  Other Topics Concern  .  Not on file  Social History Narrative   Single mother   Daughter and son, about to adopt a foster child in upcoming 2017   Dental hygienist   Social Determinants of Health   Financial Resource Strain: Not on file  Food Insecurity: Not on file  Transportation Needs: Not on file  Physical Activity: Not on file  Stress: Not on file  Social Connections: Not on file  Intimate Partner Violence: Not on file    Outpatient Medications Prior to Visit  Medication Sig Dispense Refill  . ALPRAZolam (XANAX) 0.5 MG tablet Take 1 tablet (0.5 mg total) by mouth as needed. 30 tablet 0  . bisoprolol-hydrochlorothiazide (ZIAC) 10-6.25 MG tablet Take 1 tablet by mouth daily. 90 tablet 1  . Cholecalciferol (VITAMIN D3) 5000 units CAPS Take 1 capsule (5,000 Units total) by mouth daily. 30 capsule   . famotidine (PEPCID) 40 MG tablet Take 1 tablet (40 mg total) by mouth daily for 5 days. 5 tablet 0  . fluticasone (FLONASE) 50 MCG/ACT nasal spray Place 2 sprays into both  nostrils daily. 16 g 6  . ibuprofen (ADVIL,MOTRIN) 200 MG tablet Take 200 mg by mouth every 6 (six) hours as needed for mild pain or moderate pain.    Marland Kitchen ondansetron (ZOFRAN) 4 MG tablet Take 1 tablet (4 mg total) by mouth every 6 (six) hours. 12 tablet 0  . pseudoephedrine-acetaminophen (TYLENOL SINUS) 30-500 MG TABS tablet Take 1 tablet by mouth every 4 (four) hours as needed.    . Vitamin D, Ergocalciferol, (DRISDOL) 1.25 MG (50000 UNIT) CAPS capsule Take 1 capsule (50,000 Units total) by mouth every 7 (seven) days. 12 capsule 1  . omeprazole (PRILOSEC) 20 MG capsule Take 2 capsules (40 mg total) by mouth daily. (Patient not taking: Reported on 04/10/2020) 30 capsule 11  . predniSONE (DELTASONE) 20 MG tablet 2 tabs by mouth for 2 days, 1 tab by mouth 2 days, 1/2 tab by mouth 2 days (Patient not taking: Reported on 04/10/2020) 7 tablet 0   No facility-administered medications prior to visit.    Allergies  Allergen Reactions  . Crestor [Rosuvastatin]     Mood changes  . Pravastatin     Muscle aches, elevated LFTs    Review of Systems A fourteen system review of systems was performed and found to be positive as per HPI.    Objective:    Physical Exam General:  Well Developed, well nourished, appropriate for stated age.  Neuro:  Alert and oriented,  extra-ocular muscles intact  HEENT:  Normocephalic, atraumatic, normal TM's of both ears, neck supple  Skin:  no gross rash, warm, pink. Cardiac:  RRR, S1 S2 wnl's Respiratory:  ECTA B/L, slight decreased breath sounds at lung bases noted, Not using accessory muscles, speaking in full sentences- unlabored. Vascular:  Ext warm, no cyanosis apprec.; cap RF less 2 sec. Psych:  No HI/SI, judgement and insight good, Euthymic mood. Full Affect.    BP 120/71   Pulse 80   Temp 98.5 F (36.9 C)   Ht 5\' 4"  (1.626 m)   Wt 169 lb 3.2 oz (76.7 kg)   SpO2 96%   BMI 29.04 kg/m  Wt Readings from Last 3 Encounters:  04/10/20 169 lb 3.2 oz (76.7  kg)  03/18/20 171 lb 8 oz (77.8 kg)  02/06/20 171 lb 11.2 oz (77.9 kg)    Health Maintenance Due  Topic Date Due  . COVID-19 Vaccine (1) Never done  . MAMMOGRAM  Never done  .  INFLUENZA VACCINE  09/16/2019    There are no preventive care reminders to display for this patient.   Lab Results  Component Value Date   TSH 1.100 10/26/2019   Lab Results  Component Value Date   WBC 7.9 10/26/2019   HGB 13.6 10/26/2019   HCT 41.4 10/26/2019   MCV 91 10/26/2019   PLT 224 10/26/2019   Lab Results  Component Value Date   NA 141 02/22/2020   K 4.4 02/22/2020   CO2 25 02/22/2020   GLUCOSE 107 (H) 02/22/2020   BUN 13 02/22/2020   CREATININE 0.42 (L) 02/22/2020   BILITOT 0.2 10/26/2019   ALKPHOS 76 10/26/2019   AST 16 10/26/2019   ALT 19 10/26/2019   PROT 6.5 10/26/2019   ALBUMIN 4.2 10/26/2019   CALCIUM 8.8 02/22/2020   ANIONGAP 6 10/18/2014   Lab Results  Component Value Date   CHOL 248 (H) 10/26/2019   Lab Results  Component Value Date   HDL 32 (L) 10/26/2019   Lab Results  Component Value Date   LDLCALC 129 (H) 10/26/2019   Lab Results  Component Value Date   TRIG 477 (H) 10/26/2019   Lab Results  Component Value Date   CHOLHDL 7.8 (H) 10/26/2019   Lab Results  Component Value Date   HGBA1C 5.9 (H) 10/26/2019       Assessment & Plan:   Problem List Items Addressed This Visit   None   Visit Diagnoses    COVID-19 virus infection    -  Primary   SOB (shortness of breath)       Relevant Medications   albuterol (VENTOLIN HFA) 108 (90 Base) MCG/ACT inhaler     Covid-19 virus infection, SOB: -Reassurance provided. Discussed with patient potential post Covid syndrome symptoms.  -Recommend to continue monitoring oxygen saturations and pulse. Increase physical activity as tolerated. -Monitor for worsening symptoms. -Recommend to use albuterol inhaler as needed for shortness of breath.  -Advised to resume omeprazole if acid reflux symptoms not  controlled with famotidine.   Meds ordered this encounter  Medications  . albuterol (VENTOLIN HFA) 108 (90 Base) MCG/ACT inhaler    Sig: Inhale 2 puffs into the lungs every 6 (six) hours as needed for wheezing or shortness of breath.    Dispense:  8 g    Refill:  0     Mayer Masker, PA-C

## 2020-04-10 NOTE — Patient Instructions (Signed)

## 2020-06-06 ENCOUNTER — Encounter: Payer: Self-pay | Admitting: Physician Assistant

## 2020-06-06 ENCOUNTER — Ambulatory Visit (INDEPENDENT_AMBULATORY_CARE_PROVIDER_SITE_OTHER): Payer: 59 | Admitting: Physician Assistant

## 2020-06-06 VITALS — BP 139/74 | HR 74 | Temp 97.7°F | Ht 64.0 in | Wt 172.5 lb

## 2020-06-06 DIAGNOSIS — I10 Essential (primary) hypertension: Secondary | ICD-10-CM | POA: Diagnosis not present

## 2020-06-06 DIAGNOSIS — M79641 Pain in right hand: Secondary | ICD-10-CM

## 2020-06-06 DIAGNOSIS — Z0000 Encounter for general adult medical examination without abnormal findings: Secondary | ICD-10-CM

## 2020-06-06 DIAGNOSIS — M79642 Pain in left hand: Secondary | ICD-10-CM

## 2020-06-06 DIAGNOSIS — R159 Full incontinence of feces: Secondary | ICD-10-CM

## 2020-06-06 DIAGNOSIS — F410 Panic disorder [episodic paroxysmal anxiety] without agoraphobia: Secondary | ICD-10-CM | POA: Diagnosis not present

## 2020-06-06 DIAGNOSIS — F430 Acute stress reaction: Secondary | ICD-10-CM

## 2020-06-06 DIAGNOSIS — G629 Polyneuropathy, unspecified: Secondary | ICD-10-CM

## 2020-06-06 DIAGNOSIS — E782 Mixed hyperlipidemia: Secondary | ICD-10-CM

## 2020-06-06 DIAGNOSIS — R140 Abdominal distension (gaseous): Secondary | ICD-10-CM

## 2020-06-06 MED ORDER — ATORVASTATIN CALCIUM 10 MG PO TABS: 10.0000 mg | ORAL_TABLET | Freq: Every day | ORAL | 1 refills | Status: DC

## 2020-06-06 MED ORDER — GABAPENTIN 100 MG PO CAPS: 100.0000 mg | ORAL_CAPSULE | Freq: Every day | ORAL | 0 refills | Status: DC

## 2020-06-06 NOTE — Addendum Note (Signed)
Addended by: Mayer Masker on: 06/06/2020 01:34 PM   Modules accepted: Orders

## 2020-06-06 NOTE — Progress Notes (Addendum)
Established Patient Office Visit  Subjective:  Patient ID: Mary Wade, female    DOB: May 29, 1960  Age: 60 y.o. MRN: [id number]  CC:  Chief Complaint  Patient presents with  . Hypertension    HPI Mary Wade presents for follow up on hypertension, hyperlipidemia and mood management. Patient has c/o bilateral hand pain which she attributes to arthritis and has been taking ibuprofen 400-600 mg once to twice per day frequently which helps with the pain. Pain is worse at night. Also has chronic GI issues and is concerned about possible pancreatic insuffiencey. Patient has been evaluated by gastroenterology and had a colonoscopy but has not been tested for exocrine pancreatic insuffiencey. States certain foods such as peppers will cause distension and flatulence. Also reports bowel accidents, especially with forceful activities such as coughing. Reports has had pancreatitis twice.  HTN: Pt denies chest pain, palpitations, dizziness or lower extremity swelling. Taking medication as directed without side effects. Checks BP at home and work. States yesterday BP was 117/68. Has not taken her blood pressure medication today yet. Pt follows a low salt diet.  HLD: Pt reports has tried two cholesterol medications in the past and one she did not tolerate and the other caused liver enzyme elevation. States is willing to try another medication.  Mood: Reports still has about 10 tablets left from December 2021 prescription.   Neuropathy: Patient states tried Gabapentin 100 mg to help with neuropathy and seemed to be effective and would like to try again.   Past Medical History:  Diagnosis Date  . Aortic insufficiency    a. 10/2013: mod AI by echo.  . Bicuspid aortic valve    a. 10/24/13 showed EF 55-60%, no RWMA, bicuspid AV with mod AI, mildly dilated ascending aorta, PASP .  Marland Kitchen Cerebral aneurysm   . Chronic diarrhea   . Fibromyalgia   . Gallstones   . GERD (gastroesophageal reflux disease)    . Heart murmur   . Hyperlipidemia   . Hypertension   . Pancreatitis    2 previous episodes, last one 2013  . PVC's (premature ventricular contractions)    a. 10/2013 event monitor: NSR and PVCs.  . Tobacco abuse     Past Surgical History:  Procedure Laterality Date  . ABDOMINAL HYSTERECTOMY    . APPENDECTOMY    . CHOLECYSTECTOMY      Family History  Problem Relation Age of Onset  . CAD Mother        Died at 53, several blockages but patient unknown when this started  . Aneurysm Mother        Brain aneurysm - died of this during surgery  . Hypertension Mother   . Hyperlipidemia Mother   . Thyroid disease Mother   . Heart disease Father        Died of MI at age 41 - enlarged heart  . Heart attack Father   . Early death Father   . Diabetes Paternal Grandmother   . Esophageal cancer Cousin   . Ulcerative colitis Daughter   . Diabetes Daughter   . Diabetes Son     Social History   Socioeconomic History  . Marital status: Single    Spouse name: Not on file  . Number of children: 2  . Years of education: Not on file  . Highest education level: Not on file  Occupational History    Employer: [employer]    Comment: Dental office  Tobacco Use  . Smoking status:  Former Smoker    Packs/day: 1.00    Years: 42.00    Pack years: 42.00    Types: Cigarettes    Quit date: 01/23/2016    Years since quitting: 4.3  . Smokeless tobacco: Never Used  Vaping Use  . Vaping Use: Never used  Substance and Sexual Activity  . Alcohol use: Yes    Alcohol/week: 0.0 standard drinks    Comment: twice per week - 2-6 beers on each occasion  . Drug use: No  . Sexual activity: Yes  Other Topics Concern  . Not on file  Social History Narrative   Single mother   Daughter and son, about to adopt a foster child in upcoming 2017   Dental hygienist   Social Determinants of Health   Financial Resource Strain: Not on file  Food Insecurity: Not on file  Transportation Needs: Not on file   Physical Activity: Not on file  Stress: Not on file  Social Connections: Not on file  Intimate Partner Violence: Not on file    Outpatient Medications Prior to Visit  Medication Sig Dispense Refill  . albuterol (VENTOLIN HFA) 108 (90 Base) MCG/ACT inhaler Inhale 2 puffs into the lungs every 6 (six) hours as needed for wheezing or shortness of breath. 8 g 0  . ALPRAZolam (XANAX) 0.5 MG tablet Take 1 tablet (0.5 mg total) by mouth as needed. 30 tablet 0  . bisoprolol-hydrochlorothiazide (ZIAC) 10-6.25 MG tablet Take 1 tablet by mouth daily. 90 tablet 1  . Cholecalciferol (VITAMIN D3) 5000 units CAPS Take 1 capsule (5,000 Units total) by mouth daily. 30 capsule   . fluticasone (FLONASE) 50 MCG/ACT nasal spray Place 2 sprays into both nostrils daily. 16 g 6  . ibuprofen (ADVIL,MOTRIN) 200 MG tablet Take 200 mg by mouth every 6 (six) hours as needed for mild pain or moderate pain.    Marland Kitchen omeprazole (PRILOSEC) 20 MG capsule Take 2 capsules (40 mg total) by mouth daily. 30 capsule 11  . ondansetron (ZOFRAN) 4 MG tablet Take 1 tablet (4 mg total) by mouth every 6 (six) hours. 12 tablet 0  . pseudoephedrine-acetaminophen (TYLENOL SINUS) 30-500 MG TABS tablet Take 1 tablet by mouth every 4 (four) hours as needed.    . Vitamin D, Ergocalciferol, (DRISDOL) 1.25 MG (50000 UNIT) CAPS capsule Take 1 capsule (50,000 Units total) by mouth every 7 (seven) days. 12 capsule 1  . famotidine (PEPCID) 40 MG tablet Take 1 tablet (40 mg total) by mouth daily for 5 days. 5 tablet 0   No facility-administered medications prior to visit.    Allergies  Allergen Reactions  . Crestor [Rosuvastatin]     Mood changes  . Pravastatin     Muscle aches, elevated LFTs    ROS Review of Systems A fourteen system review of systems was performed and found to be positive as per HPI.  Objective:    Physical Exam General:  Well Developed, well nourished, in no acute distress Neuro:  Alert and oriented,  extra-ocular  muscles intact  HEENT:  Normocephalic, atraumatic, neck supple Skin:  no gross rash, warm, pink. Cardiac:  RRR, S1 S2 wnl's  Respiratory:  ECTA B/L w/o wheezing, Not using accessory muscles, speaking in full sentences- unlabored. Vascular:  Ext warm, no cyanosis apprec.; cap RF less 2 sec. Psych:  No HI/SI, judgement and insight good, Euthymic mood. Full Affect.   BP 139/74   Pulse 74   Temp 97.7 F (36.5 C)   Ht $R'5\' 4"'Yw$  (1.626  m)   Wt 172 lb 8 oz (78.2 kg)   SpO2 97%   BMI 29.61 kg/m  Wt Readings from Last 3 Encounters:  06/06/20 172 lb 8 oz (78.2 kg)  04/10/20 169 lb 3.2 oz (76.7 kg)  03/18/20 171 lb 8 oz (77.8 kg)     Health Maintenance Due  Topic Date Due  . COVID-19 Vaccine (1) Never done  . MAMMOGRAM  Never done    There are no preventive care reminders to display for this patient.  Lab Results  Component Value Date   TSH 1.100 10/26/2019   Lab Results  Component Value Date   WBC 7.9 10/26/2019   HGB 13.6 10/26/2019   HCT 41.4 10/26/2019   MCV 91 10/26/2019   PLT 224 10/26/2019   Lab Results  Component Value Date   NA 141 02/22/2020   K 4.4 02/22/2020   CO2 25 02/22/2020   GLUCOSE 107 (H) 02/22/2020   BUN 13 02/22/2020   CREATININE 0.42 (L) 02/22/2020   BILITOT 0.2 10/26/2019   ALKPHOS 76 10/26/2019   AST 16 10/26/2019   ALT 19 10/26/2019   PROT 6.5 10/26/2019   ALBUMIN 4.2 10/26/2019   CALCIUM 8.8 02/22/2020   ANIONGAP 6 10/18/2014   Lab Results  Component Value Date   CHOL 248 (H) 10/26/2019   Lab Results  Component Value Date   HDL 32 (L) 10/26/2019   Lab Results  Component Value Date   LDLCALC 129 (H) 10/26/2019   Lab Results  Component Value Date   TRIG 477 (H) 10/26/2019   Lab Results  Component Value Date   CHOLHDL 7.8 (H) 10/26/2019   Lab Results  Component Value Date   HGBA1C 5.9 (H) 10/26/2019      Assessment & Plan:   Problem List Items Addressed This Visit      Cardiovascular and Mediastinum   Essential  hypertension - Primary (Chronic)   Relevant Medications   atorvastatin (LIPITOR) 10 MG tablet   Other Relevant Orders   Comp Met (CMET)   Lipid Profile   CBC w/Diff   HgB A1c   Pancreatic Elastase, Fecal     Nervous and Auditory   Neuropathy   Relevant Medications   gabapentin (NEURONTIN) 100 MG capsule     Other   Mixed hyperlipidemia   Relevant Medications   atorvastatin (LIPITOR) 10 MG tablet   Other Relevant Orders   Comp Met (CMET)   Lipid Profile   CBC w/Diff   HgB A1c   Pancreatic Elastase, Fecal    Other Visit Diagnoses    Panic attack as reaction to stress       Abdominal bloating       Relevant Orders   Pancreatic Elastase, Fecal   Incontinence of feces, unspecified fecal incontinence type       Relevant Orders   Pancreatic Elastase, Fecal   Healthcare maintenance       Relevant Medications   atorvastatin (LIPITOR) 10 MG tablet   Other Relevant Orders   Comp Met (CMET)   Lipid Profile   CBC w/Diff   HgB A1c   Pancreatic Elastase, Fecal   Pain in both hands         Essential hypertension: -Fairly controlled. -Continue current medication regimen. -Will repeat CMP for medication monitoring. -Will continue to monitor.  Mixed hyperlipidemia: -Last lipid panel: total cholesterol 248, triglycerides 477, HDL 32, LDL 129 -Patient has med allergies to rosuvastatin and pravastatin. Will trial atorvastatin 10 mg. Recommend  to continue CoQ-10. -Follow a heart healthy diet. -Will collect lipid panel and hepatic function today. Advised to schedule lab visit in 6 weeks to repeat lipid panel and hepatic function.  Abdominal bloating, Incontinence of feces: -Discussed with patient referral to gastroenterology and declines at this time.  Advised hypertriglyceridemia can increase risk for pancreatitis and recommend improvement of cholesterol/triglycerides. Will place orders for fecal elastase to evaluate for possible EPI given patient's concern.  -Reviewed endoscopy,  colonoscopy and pathology results 01/2015. Normal esophagus, stomach and duodenum, no signs of colitis, benign polyp (nonadenomatous).   Panic attack as a reaction to stress: -Stable. -Continue current medication as needed for severe anxiety/panic attack.  -Will continue to monitor.   Pain in both hands: -Recommend to apply topical anti-inflammatory such as diclofenac gel (Voltaren) and reduce frequent use of ibuprofen. Will repeat renal function and if normal or stable will send rx for meloxicam 7.5 mg to take as needed for pain relief.  -If symptoms fail to improve or worsen recommend further evaluation with imaging studies or referral to Sports Med.  Neuropathy: -Will send rx for Gabapentin 100 mg. -Will continue to monitor.  Meds ordered this encounter  Medications  . atorvastatin (LIPITOR) 10 MG tablet    Sig: Take 1 tablet (10 mg total) by mouth daily.    Dispense:  30 tablet    Refill:  1    Order Specific Question:   Supervising Provider    Answer:   Beatrice Lecher D [[id number]]  . gabapentin (NEURONTIN) 100 MG capsule    Sig: Take 1 capsule (100 mg total) by mouth at bedtime.    Dispense:  30 capsule    Refill:  0    Order Specific Question:   Supervising Provider    Answer:   Beatrice Lecher D [[id number]]    Follow-up: Return in about 4 months (around 10/06/2020) for HLD, HTN, Abdominal issues (labs in 6 wks-Lipid and Hepatic Function).   Note:  This note was prepared with assistance of Dragon voice recognition software. Occasional wrong-word or sound-a-like substitutions may have occurred due to the inherent limitations of voice recognition software.  Lorrene Reid, PA-C

## 2020-06-06 NOTE — Patient Instructions (Signed)
High Cholesterol  High cholesterol is a condition in which the blood has high levels of a white, waxy substance similar to fat (cholesterol). The liver makes all the cholesterol that the body needs. The human body needs small amounts of cholesterol to help build cells. A person gets extra or excess cholesterol from the food that he or she eats. The blood carries cholesterol from the liver to the rest of the body. If you have high cholesterol, deposits (plaques) may build up on the walls of your arteries. Arteries are the blood vessels that carry blood away from your heart. These plaques make the arteries narrow and stiff. Cholesterol plaques increase your risk for heart attack and stroke. Work with your health care provider to keep your cholesterol levels in a healthy range. What increases the risk? The following factors may make you more likely to develop this condition:  Eating foods that are high in animal fat (saturated fat) or cholesterol.  Being overweight.  Not getting enough exercise.  A family history of high cholesterol (familial hypercholesterolemia).  Use of tobacco products.  Having diabetes. What are the signs or symptoms? There are no symptoms of this condition. How is this diagnosed? This condition may be diagnosed based on the results of a blood test.  If you are older than 59 years of age, your health care provider may check your cholesterol levels every 4-6 years.  You may be checked more often if you have high cholesterol or other risk factors for heart disease. The blood test for cholesterol measures:  "Bad" cholesterol, or LDL cholesterol. This is the main type of cholesterol that causes heart disease. The desired level is less than 100 mg/dL.  "Good" cholesterol, or HDL cholesterol. HDL helps protect against heart disease by cleaning the arteries and carrying the LDL to the liver for processing. The desired level for HDL is 60 mg/dL or higher.  Triglycerides.  These are fats that your body can store or burn for energy. The desired level is less than 150 mg/dL.  Total cholesterol. This measures the total amount of cholesterol in your blood and includes LDL, HDL, and triglycerides. The desired level is less than 200 mg/dL. How is this treated? This condition may be treated with:  Diet changes. You may be asked to eat foods that have more fiber and less saturated fats or added sugar.  Lifestyle changes. These may include regular exercise, maintaining a healthy weight, and quitting use of tobacco products.  Medicines. These are given when diet and lifestyle changes have not worked. You may be prescribed a statin medicine to help lower your cholesterol levels. Follow these instructions at home: Eating and drinking  Eat a healthy, balanced diet. This diet includes: ? Daily servings of a variety of fresh, frozen, or canned fruits and vegetables. ? Daily servings of whole grain foods that are rich in fiber. ? Foods that are low in saturated fats and trans fats. These include poultry and fish without skin, lean cuts of meat, and low-fat dairy products. ? A variety of fish, especially oily fish that contain omega-3 fatty acids. Aim to eat fish at least 2 times a week.  Avoid foods and drinks that have added sugar.  Use healthy cooking methods, such as roasting, grilling, broiling, baking, poaching, steaming, and stir-frying. Do not fry your food except for stir-frying.   Lifestyle  Get regular exercise. Aim to exercise for a total of 150 minutes a week. Increase your activity level by doing activities   such as gardening, walking, and taking the stairs.  Do not use any products that contain nicotine or tobacco, such as cigarettes, e-cigarettes, and chewing tobacco. If you need help quitting, ask your health care provider.   General instructions  Take over-the-counter and prescription medicines only as told by your health care provider.  Keep all  follow-up visits as told by your health care provider. This is important. Where to find more information  American Heart Association: www.heart.org  National Heart, Lung, and Blood Institute: www.nhlbi.nih.gov Contact a health care provider if:  You have trouble achieving or maintaining a healthy diet or weight.  You are starting an exercise program.  You are unable to stop smoking. Get help right away if:  You have chest pain.  You have trouble breathing.  You have any symptoms of a stroke. "BE FAST" is an easy way to remember the main warning signs of a stroke: ? B - Balance. Signs are dizziness, sudden trouble walking, or loss of balance. ? E - Eyes. Signs are trouble seeing or a sudden change in vision. ? F - Face. Signs are sudden weakness or numbness of the face, or the face or eyelid drooping on one side. ? A - Arms. Signs are weakness or numbness in an arm. This happens suddenly and usually on one side of the body. ? S - Speech. Signs are sudden trouble speaking, slurred speech, or trouble understanding what people say. ? T - Time. Time to call emergency services. Write down what time symptoms started.  You have other signs of a stroke, such as: ? A sudden, severe headache with no known cause. ? Nausea or vomiting. ? Seizure. These symptoms may represent a serious problem that is an emergency. Do not wait to see if the symptoms will go away. Get medical help right away. Call your local emergency services (911 in the [place] Do not drive yourself to the hospital. Summary  Cholesterol plaques increase your risk for heart attack and stroke. Work with your health care provider to keep your cholesterol levels in a healthy range.  Eat a healthy, balanced diet, get regular exercise, and maintain a healthy weight.  Do not use any products that contain nicotine or tobacco, such as cigarettes, e-cigarettes, and chewing tobacco.  Get help right away if you have any symptoms of a  stroke. This information is not intended to replace advice given to you by your health care provider. Make sure you discuss any questions you have with your health care provider. Document Revised: 01/01/2019 Document Reviewed: 01/01/2019 Elsevier Patient Education  2021 Else[employer] Inc.  

## 2020-06-07 LAB — CBC WITH DIFFERENTIAL/PLATELET
Basophils Absolute: 0.1 10*3/uL (ref 0.0–0.2)
Basos: 1 %
EOS (ABSOLUTE): 0.2 10*3/uL (ref 0.0–0.4)
Eos: 3 %
Hematocrit: 38.5 % (ref 34.0–46.6)
Hemoglobin: 13 g/dL (ref 11.1–15.9)
Immature Grans (Abs): 0 10*3/uL (ref 0.0–0.1)
Immature Granulocytes: 0 %
Lymphocytes Absolute: 2.6 10*3/uL (ref 0.7–3.1)
Lymphs: 42 %
MCH: 30.4 pg (ref 26.6–33.0)
MCHC: 33.8 g/dL (ref 31.5–35.7)
MCV: 90 fL (ref 79–97)
Monocytes Absolute: 0.4 10*3/uL (ref 0.1–0.9)
Monocytes: 7 %
Neutrophils Absolute: 2.9 10*3/uL (ref 1.4–7.0)
Neutrophils: 47 %
Platelets: 240 10*3/uL (ref 150–450)
RBC: 4.28 x10E6/uL (ref 3.77–5.28)
RDW: 13.1 % (ref 11.7–15.4)
WBC: 6.2 10*3/uL (ref 3.4–10.8)

## 2020-06-07 LAB — COMPREHENSIVE METABOLIC PANEL
ALT: 33 IU/L — ABNORMAL HIGH (ref 0–32)
AST: 23 IU/L (ref 0–40)
Albumin/Globulin Ratio: 1.8 (ref 1.2–2.2)
Albumin: 4.2 g/dL (ref 3.8–4.9)
Alkaline Phosphatase: 67 IU/L (ref 44–121)
BUN/Creatinine Ratio: 35 — ABNORMAL HIGH (ref 9–23)
BUN: 15 mg/dL (ref 6–24)
Bilirubin Total: 0.3 mg/dL (ref 0.0–1.2)
CO2: 22 mmol/L (ref 20–29)
Calcium: 9 mg/dL (ref 8.7–10.2)
Chloride: 102 mmol/L (ref 96–106)
Creatinine, Ser: 0.43 mg/dL — ABNORMAL LOW (ref 0.57–1.00)
Globulin, Total: 2.4 g/dL (ref 1.5–4.5)
Glucose: 110 mg/dL — ABNORMAL HIGH (ref 65–99)
Potassium: 4.3 mmol/L (ref 3.5–5.2)
Sodium: 142 mmol/L (ref 134–144)
Total Protein: 6.6 g/dL (ref 6.0–8.5)
eGFR: 112 mL/min/{1.73_m2} (ref >59)

## 2020-06-07 LAB — LIPID PANEL
Chol/HDL Ratio: 7.2 ratio — ABNORMAL HIGH (ref 0.0–4.4)
Cholesterol, Total: 237 mg/dL — ABNORMAL HIGH (ref 100–199)
HDL: 33 mg/dL — ABNORMAL LOW (ref >39)
LDL Chol Calc (NIH): 124 mg/dL — ABNORMAL HIGH (ref 0–99)
Triglycerides: 449 mg/dL — ABNORMAL HIGH (ref 0–149)
VLDL Cholesterol Cal: 80 mg/dL — ABNORMAL HIGH (ref 5–40)

## 2020-06-07 LAB — HEMOGLOBIN A1C
Est. average glucose Bld gHb Est-mCnc: 157 mg/dL
Hgb A1c MFr Bld: 7.1 % — ABNORMAL HIGH (ref 4.8–5.6)

## 2020-06-13 ENCOUNTER — Encounter: Payer: Self-pay | Admitting: Nurse Practitioner

## 2020-06-13 ENCOUNTER — Other Ambulatory Visit: Payer: Self-pay

## 2020-06-13 ENCOUNTER — Telehealth: Payer: Self-pay | Admitting: Physician Assistant

## 2020-06-13 ENCOUNTER — Ambulatory Visit (INDEPENDENT_AMBULATORY_CARE_PROVIDER_SITE_OTHER): Payer: 59 | Admitting: Nurse Practitioner

## 2020-06-13 VITALS — BP 129/73 | HR 79 | Temp 98.1°F | Ht 64.0 in | Wt 169.6 lb

## 2020-06-13 DIAGNOSIS — J0110 Acute frontal sinusitis, unspecified: Secondary | ICD-10-CM

## 2020-06-13 MED ORDER — AZITHROMYCIN 250 MG PO TABS: ORAL_TABLET | ORAL | 0 refills | Status: DC

## 2020-06-13 NOTE — Progress Notes (Signed)
Acute Office Visit  Subjective:    Patient ID: Mary Wade, female    DOB: [birth year], 60 y.o.   MRN: [id number]  Chief Complaint  Patient presents with  . Acute Visit    Nasal Congestion  . Cough    HPI Patient is in today for evaluation of sore throat and headache. She states that symptoms started yesterday after she took down a large tree in her yard on Wednesday. She states that there was much pollen and dust in the tree. She states that symptoms started the very next day. She denies fever, nausea, or vomiting. She has been taking ChlorTab and Ibuprofen which have not been helping. She has had a negative COVID 19 test.   Past Medical History:  Diagnosis Date  . Aortic insufficiency    a. 10/2013: mod AI by echo.  . Bicuspid aortic valve    a. 10/24/13 showed EF 55-60%, no RWMA, bicuspid AV with mod AI, mildly dilated ascending aorta, PASP .  Marland Kitchen Cerebral aneurysm   . Chronic diarrhea   . Fibromyalgia   . Gallstones   . GERD (gastroesophageal reflux disease)   . Heart murmur   . Hyperlipidemia   . Hypertension   . Pancreatitis    2 previous episodes, last one 2013  . PVC's (premature ventricular contractions)    a. 10/2013 event monitor: NSR and PVCs.  . Tobacco abuse     Past Surgical History:  Procedure Laterality Date  . ABDOMINAL HYSTERECTOMY    . APPENDECTOMY    . CHOLECYSTECTOMY      Family History  Problem Relation Age of Onset  . CAD Mother        Died at 54, several blockages but patient unknown when this started  . Aneurysm Mother        Brain aneurysm - died of this during surgery  . Hypertension Mother   . Hyperlipidemia Mother   . Thyroid disease Mother   . Heart disease Father        Died of MI at age 19 - enlarged heart  . Heart attack Father   . Early death Father   . Diabetes Paternal Grandmother   . Esophageal cancer Cousin   . Ulcerative colitis Daughter   . Diabetes Daughter   . Diabetes Son     Social History    Socioeconomic History  . Marital status: Single    Spouse name: Not on file  . Number of children: 2  . Years of education: Not on file  . Highest education level: Not on file  Occupational History    Employer: [employer]    Comment: Dental office  Tobacco Use  . Smoking status: Former Smoker    Packs/day: 1.00    Years: 42.00    Pack years: 42.00    Types: Cigarettes    Quit date: 01/23/2016    Years since quitting: 4.4  . Smokeless tobacco: Never Used  Vaping Use  . Vaping Use: Never used  Substance and Sexual Activity  . Alcohol use: Yes    Alcohol/week: 0.0 standard drinks    Comment: twice per week - 2-6 beers on each occasion  . Drug use: No  . Sexual activity: Yes  Other Topics Concern  . Not on file  Social History Narrative   Single mother   Daughter and son, about to adopt a foster child in upcoming 2017   Dental hygienist   Social Determinants of Health   Financial  Resource Strain: Not on file  Food Insecurity: Not on file  Transportation Needs: Not on file  Physical Activity: Not on file  Stress: Not on file  Social Connections: Not on file  Intimate Partner Violence: Not on file    Outpatient Medications Prior to Visit  Medication Sig Dispense Refill  . albuterol (VENTOLIN HFA) 108 (90 Base) MCG/ACT inhaler Inhale 2 puffs into the lungs every 6 (six) hours as needed for wheezing or shortness of breath. 8 g 0  . ALPRAZolam (XANAX) 0.5 MG tablet Take 1 tablet (0.5 mg total) by mouth as needed. 30 tablet 0  . atorvastatin (LIPITOR) 10 MG tablet Take 1 tablet (10 mg total) by mouth daily. 30 tablet 1  . bisoprolol-hydrochlorothiazide (ZIAC) 10-6.25 MG tablet Take 1 tablet by mouth daily. 90 tablet 1  . Cholecalciferol (VITAMIN D3) 5000 units CAPS Take 1 capsule (5,000 Units total) by mouth daily. 30 capsule   . fluticasone (FLONASE) 50 MCG/ACT nasal spray Place 2 sprays into both nostrils daily. 16 g 6  . gabapentin (NEURONTIN) 100 MG capsule Take 1  capsule (100 mg total) by mouth at bedtime. 30 capsule 0  . ibuprofen (ADVIL,MOTRIN) 200 MG tablet Take 200 mg by mouth every 6 (six) hours as needed for mild pain or moderate pain.    Marland Kitchen omeprazole (PRILOSEC) 20 MG capsule Take 2 capsules (40 mg total) by mouth daily. 30 capsule 11  . ondansetron (ZOFRAN) 4 MG tablet Take 1 tablet (4 mg total) by mouth every 6 (six) hours. 12 tablet 0  . Vitamin D, Ergocalciferol, (DRISDOL) 1.25 MG (50000 UNIT) CAPS capsule Take 1 capsule (50,000 Units total) by mouth every 7 (seven) days. 12 capsule 1  . pseudoephedrine-acetaminophen (TYLENOL SINUS) 30-500 MG TABS tablet Take 1 tablet by mouth every 4 (four) hours as needed.    . famotidine (PEPCID) 40 MG tablet Take 1 tablet (40 mg total) by mouth daily for 5 days. 5 tablet 0   No facility-administered medications prior to visit.    Allergies  Allergen Reactions  . Crestor [Rosuvastatin]     Mood changes  . Pravastatin     Muscle aches, elevated LFTs    Review of Systems  Constitutional: Positive for fatigue. Negative for chills and fever.  HENT: Positive for congestion, postnasal drip, rhinorrhea, sinus pressure, sinus pain and sore throat.   Eyes: Negative.   Respiratory: Positive for cough. Negative for wheezing.   Cardiovascular: Negative for chest pain and palpitations.  Gastrointestinal: Negative for constipation, nausea and vomiting.  Musculoskeletal: Negative for back pain and myalgias.  Skin: Negative for rash.  Allergic/Immunologic: Positive for environmental allergies.  Neurological: Positive for headaches. Negative for dizziness and weakness.  Hematological: Positive for adenopathy.  Psychiatric/Behavioral: Negative for agitation and confusion. The patient is not nervous/anxious.   All other systems reviewed and are negative.      Objective:    Physical Exam Vitals and nursing note reviewed.  Constitutional:      Appearance: Normal appearance. She is well-developed. She is  ill-appearing.  HENT:     Head: Normocephalic and atraumatic.     Right Ear: Tympanic membrane is erythematous and bulging.     Left Ear: Tympanic membrane is erythematous and bulging.     Nose: Congestion present.     Right Sinus: Maxillary sinus tenderness and frontal sinus tenderness present.     Left Sinus: Maxillary sinus tenderness and frontal sinus tenderness present.     Mouth/Throat:  Pharynx: Posterior oropharyngeal erythema present.  Eyes:     Pupils: Pupils are equal, round, and reactive to light.  Cardiovascular:     Rate and Rhythm: Normal rate and regular rhythm.     Pulses: Normal pulses.     Heart sounds: Normal heart sounds.  Pulmonary:     Effort: Pulmonary effort is normal.     Breath sounds: Normal breath sounds.  Abdominal:     Palpations: Abdomen is soft.  Musculoskeletal:        General: Normal range of motion.     Cervical back: Normal range of motion and neck supple.  Lymphadenopathy:     Cervical: Cervical adenopathy present.  Skin:    General: Skin is warm and dry.     Capillary Refill: Capillary refill takes less than 2 seconds.  Neurological:     General: No focal deficit present.     Mental Status: She is alert and oriented to person, place, and time.  Psychiatric:        Mood and Affect: Mood normal.        Behavior: Behavior normal.        Thought Content: Thought content normal.        Judgment: Judgment normal.     Today's Vitals   06/13/20 1041  BP: 129/73  Pulse: 79  Temp: 98.1 F (36.7 C)  SpO2: 95%  Weight: 169 lb 9.6 oz (76.9 kg)  Height: 5\' 4"  (1.626 m)   Body mass index is 29.11 kg/m.   Wt Readings from Last 3 Encounters:  06/21/20 165 lb (74.8 kg)  06/13/20 169 lb 9.6 oz (76.9 kg)  06/06/20 172 lb 8 oz (78.2 kg)    Health Maintenance Due  Topic Date Due  . COVID-19 Vaccine (1) Never done  . MAMMOGRAM  Never done    There are no preventive care reminders to display for this patient.   Lab Results   Component Value Date   TSH 1.100 10/26/2019   Lab Results  Component Value Date   WBC 10.2 06/21/2020   HGB 13.1 06/21/2020   HCT 38.4 06/21/2020   MCV 90.6 06/21/2020   PLT 289 06/21/2020   Lab Results  Component Value Date   NA 139 06/21/2020   K 3.9 06/21/2020   CO2 27 06/21/2020   GLUCOSE 126 (H) 06/21/2020   BUN 19 06/21/2020   CREATININE 0.57 06/21/2020   BILITOT 0.3 06/06/2020   ALKPHOS 67 06/06/2020   AST 23 06/06/2020   ALT 33 (H) 06/06/2020   PROT 6.6 06/06/2020   ALBUMIN 4.2 06/06/2020   CALCIUM 8.9 06/21/2020   ANIONGAP 8 06/21/2020   EGFR 112 06/06/2020   Lab Results  Component Value Date   CHOL 237 (H) 06/06/2020   Lab Results  Component Value Date   HDL 33 (L) 06/06/2020   Lab Results  Component Value Date   LDLCALC 124 (H) 06/06/2020   Lab Results  Component Value Date   TRIG 449 (H) 06/06/2020   Lab Results  Component Value Date   CHOLHDL 7.2 (H) 06/06/2020   Lab Results  Component Value Date   HGBA1C 7.1 (H) 06/06/2020       Assessment & Plan:  1. Acute non-recurrent frontal sinusitis Stat z-pack. Take as directed fro 6 days. Rest and increase fluids. Recommend taking OTC medication as needed and as indicated to improve acute symptoms   Problem List Items Addressed This Visit      Respiratory  Acute frontal sinusitis - Primary       Meds ordered this encounter  Medications  . DISCONTD: azithromycin (ZITHROMAX) 250 MG tablet    Sig: z-pack - take as directed for 5 days    Dispense:  6 tablet    Refill:  0    Order Specific Question:   Supervising Provider    Answer:   Beatrice Lecher D [[id number]]     Ronnell Freshwater, NP

## 2020-06-13 NOTE — Telephone Encounter (Signed)
Patient has a negative COVID test as of 4/28. Patient is experiencing cough, runny nose and pain and pressure around ears. Is asking for treatment for sinus infection. Please contact and advise. Thank you

## 2020-06-13 NOTE — Telephone Encounter (Signed)
Patient schedule for today at 1030am with Heather. AS, CMA

## 2020-06-13 NOTE — Patient Instructions (Signed)

## 2020-06-21 ENCOUNTER — Other Ambulatory Visit: Payer: Self-pay

## 2020-06-21 ENCOUNTER — Emergency Department (HOSPITAL_BASED_OUTPATIENT_CLINIC_OR_DEPARTMENT_OTHER)
Admission: EM | Admit: 2020-06-21 | Discharge: 2020-06-22 | Disposition: A | Discharge status: Home / Self Care | Payer: 59 | Attending: Emergency Medicine | Admitting: Emergency Medicine

## 2020-06-21 ENCOUNTER — Encounter (HOSPITAL_BASED_OUTPATIENT_CLINIC_OR_DEPARTMENT_OTHER): Payer: Self-pay | Admitting: Emergency Medicine

## 2020-06-21 DIAGNOSIS — M542 Cervicalgia: Secondary | ICD-10-CM | POA: Diagnosis not present

## 2020-06-21 DIAGNOSIS — I10 Essential (primary) hypertension: Secondary | ICD-10-CM | POA: Diagnosis not present

## 2020-06-21 DIAGNOSIS — R202 Paresthesia of skin: Secondary | ICD-10-CM | POA: Insufficient documentation

## 2020-06-21 DIAGNOSIS — Z87891 Personal history of nicotine dependence: Secondary | ICD-10-CM | POA: Diagnosis not present

## 2020-06-21 DIAGNOSIS — Z79899 Other long term (current) drug therapy: Secondary | ICD-10-CM | POA: Diagnosis not present

## 2020-06-21 LAB — CBC WITH DIFFERENTIAL/PLATELET
Abs Immature Granulocytes: 0.05 10*3/uL (ref 0.00–0.07)
Basophils Absolute: 0.1 10*3/uL (ref 0.0–0.1)
Basophils Relative: 1 %
Eosinophils Absolute: 0.3 10*3/uL (ref 0.0–0.5)
Eosinophils Relative: 3 %
HCT: 38.4 % (ref 36.0–46.0)
Hemoglobin: 13.1 g/dL (ref 12.0–15.0)
Immature Granulocytes: 1 %
Lymphocytes Relative: 36 %
Lymphs Abs: 3.7 10*3/uL (ref 0.7–4.0)
MCH: 30.9 pg (ref 26.0–34.0)
MCHC: 34.1 g/dL (ref 30.0–36.0)
MCV: 90.6 fL (ref 80.0–100.0)
Monocytes Absolute: 0.7 10*3/uL (ref 0.1–1.0)
Monocytes Relative: 7 %
Neutro Abs: 5.4 10*3/uL (ref 1.7–7.7)
Neutrophils Relative %: 52 %
Platelets: 289 10*3/uL (ref 150–400)
RBC: 4.24 MIL/uL (ref 3.87–5.11)
RDW: 12.8 % (ref 11.5–15.5)
WBC: 10.2 10*3/uL (ref 4.0–10.5)
nRBC: 0 % (ref 0.0–0.2)

## 2020-06-21 LAB — BASIC METABOLIC PANEL
Anion gap: 8 (ref 5–15)
BUN: 19 mg/dL (ref 6–20)
CO2: 27 mmol/L (ref 22–32)
Calcium: 8.9 mg/dL (ref 8.9–10.3)
Chloride: 104 mmol/L (ref 98–111)
Creatinine, Ser: 0.57 mg/dL (ref 0.44–1.00)
GFR, Estimated: >60 mL/min (ref >60)
Glucose, Bld: 126 mg/dL — ABNORMAL HIGH (ref 70–99)
Potassium: 3.9 mmol/L (ref 3.5–5.1)
Sodium: 139 mmol/L (ref 135–145)

## 2020-06-21 NOTE — ED Notes (Signed)
Patient reports to this nurse that before arriving at the ED she self medicated with Gabapentin and Percocet. Patients states it has eased the pain but the pain is still there.

## 2020-06-21 NOTE — ED Triage Notes (Signed)
Reports left sided face pain and numbness.  Reports it started at 0830 this morning. Initially the pain was in the left side of the neck then progressed to the left face, teeth, and ear.  No droop noted.  Reports she noticed the face was hurting around 1pm.

## 2020-06-21 NOTE — ED Provider Notes (Signed)
[facility] Provider Note: Lowella Dell, MD, FACEP  CSN: [id number] MRN: [id number] ARRIVAL: 06/21/20 at 2207 ROOM: [facility]04/[facility]04   CHIEF COMPLAINT  Facial Pain   HISTORY OF PRESENT ILLNESS  06/21/20 11:15 PM Mary Wade is a 60 y.o. female who developed pain in her neck this morning beginning about 8:30 AM.  The pain is localized at the location of the left jugulodigastric lymph node but that lymph node is not swollen.  The pain radiates to the left ear.  It has sharp and dull components.  She describes the pain as feeling like a sore throat but it is not worse with swallowing.  The pain is exacerbated by moving her tongue to the right and relieved by moving her tongue to the left.  She has some paresthesias of the left side of the lower face but denies actual numbness.  There is no swelling or erythema.  There is no focal tooth pain.  She has no facial droop but is having difficulty speaking due to the pain caused with moving her tongue.  She took a Percocet prior to arrival with partial relief.   Past Medical History:  Diagnosis Date  . Aortic insufficiency    a. 10/2013: mod AI by echo.  . Bicuspid aortic valve    a. 10/24/13 showed EF 55-60%, no RWMA, bicuspid AV with mod AI, mildly dilated ascending aorta, PASP .  Marland Kitchen Cerebral aneurysm   . Chronic diarrhea   . Fibromyalgia   . Gallstones   . GERD (gastroesophageal reflux disease)   . Heart murmur   . Hyperlipidemia   . Hypertension   . Pancreatitis    2 previous episodes, last one 2013  . PVC's (premature ventricular contractions)    a. 10/2013 event monitor: NSR and PVCs.  . Tobacco abuse     Past Surgical History:  Procedure Laterality Date  . ABDOMINAL HYSTERECTOMY    . APPENDECTOMY    . CHOLECYSTECTOMY      Family History  Problem Relation Age of Onset  . CAD Mother        Died at 66, several blockages but patient unknown when this started  . Aneurysm Mother        Brain aneurysm - died of this  during surgery  . Hypertension Mother   . Hyperlipidemia Mother   . Thyroid disease Mother   . Heart disease Father        Died of MI at age 48 - enlarged heart  . Heart attack Father   . Early death Father   . Diabetes Paternal Grandmother   . Esophageal cancer Cousin   . Ulcerative colitis Daughter   . Diabetes Daughter   . Diabetes Son     Social History   Tobacco Use  . Smoking status: Former Smoker    Packs/day: 1.00    Years: 42.00    Pack years: 42.00    Types: Cigarettes    Quit date: 01/23/2016    Years since quitting: 4.4  . Smokeless tobacco: Never Used  Vaping Use  . Vaping Use: Never used  Substance Use Topics  . Alcohol use: Yes    Alcohol/week: 0.0 standard drinks    Comment: twice per week - 2-6 beers on each occasion  . Drug use: No    Prior to Admission medications   Medication Sig Start Date End Date Taking? Authorizing Provider  albuterol (VENTOLIN HFA) 108 (90 Base) MCG/ACT inhaler Inhale 2 puffs into the  lungs every 6 (six) hours as needed for wheezing or shortness of breath. 04/10/20   Mayer MaskerAbonza, Maritza, PA-C  ALPRAZolam (XANAX) 0.5 MG tablet Take 1 tablet (0.5 mg total) by mouth as needed. 02/06/20   Mayer MaskerAbonza, Maritza, PA-C  atorvastatin (LIPITOR) 10 MG tablet Take 1 tablet (10 mg total) by mouth daily. 06/06/20   Mayer MaskerAbonza, Maritza, PA-C  bisoprolol-hydrochlorothiazide (ZIAC) 10-6.25 MG tablet Take 1 tablet by mouth daily. 02/06/20   Mayer MaskerAbonza, Maritza, PA-C  Cholecalciferol (VITAMIN D3) 5000 units CAPS Take 1 capsule (5,000 Units total) by mouth daily. 10/08/16   Thomasene Lotpalski, Deborah, DO  famotidine (PEPCID) 40 MG tablet Take 1 tablet (40 mg total) by mouth daily for 5 days. 04/08/20 04/13/20  Bing NeighborsHarris, Kimberly S, FNP  fluticasone (FLONASE) 50 MCG/ACT nasal spray Place 2 sprays into both nostrils daily. 03/18/20   Mayer MaskerAbonza, Maritza, PA-C  gabapentin (NEURONTIN) 100 MG capsule Take 1 capsule (100 mg total) by mouth at bedtime. 06/06/20   Mayer MaskerAbonza, Maritza, PA-C  ibuprofen  (ADVIL,MOTRIN) 200 MG tablet Take 200 mg by mouth every 6 (six) hours as needed for mild pain or moderate pain.    [provider]  omeprazole (PRILOSEC) 20 MG capsule Take 2 capsules (40 mg total) by mouth daily. 11/07/14   Barrett, Joline Salthonda G, PA-C  ondansetron (ZOFRAN) 4 MG tablet Take 1 tablet (4 mg total) by mouth every 6 (six) hours. 04/08/20   Bing NeighborsHarris, Kimberly S, FNP  oxyCODONE-acetaminophen (PERCOCET) 5-325 MG tablet Take 1 tablet by mouth every 6 (six) hours as needed for severe pain. 06/22/20   Tehila Sokolow, MD  Vitamin D, Ergocalciferol, (DRISDOL) 1.25 MG (50000 UNIT) CAPS capsule Take 1 capsule (50,000 Units total) by mouth every 7 (seven) days. 02/06/20   Mayer MaskerAbonza, Maritza, PA-C    Allergies Crestor [rosuvastatin] and Pravastatin   REVIEW OF SYSTEMS  Negative except as noted here or in the History of Present Illness.   PHYSICAL EXAMINATION  Initial Vital Signs Blood pressure (!) 136/57, pulse 73, temperature 98 F (36.7 C), temperature source Oral, resp. rate 18, height 5\' 4"  (1.626 m), weight 74.8 kg, SpO2 96 %.  Examination General: Well-developed, well-nourished female in no acute distress; appearance consistent with age of record HENT: normocephalic; atraumatic; TMs normal; no dental tenderness; no swelling Eyes: pupils equal, round and reactive to light; extraocular muscles intact Neck: supple; tender left jugulodigastric lymph node without enlargement Heart: regular rate and rhythm Lungs: clear to auscultation bilaterally Abdomen: soft; nondistended; nontender; bowel sounds present Extremities: No deformity; full range of motion; pulses normal Neurologic: Awake, alert and oriented; motor function intact in all extremities and symmetric; no facial droop Skin: Warm and dry Psychiatric: Normal mood and affect   RESULTS  Summary of this visit's results, reviewed and interpreted by myself:   EKG Interpretation  Date/Time:    Ventricular Rate:    PR Interval:     QRS Duration:   QT Interval:    QTC Calculation:   R Axis:     Text Interpretation:        Laboratory Studies: Results for orders placed or performed during the hospital encounter of 06/21/20 (from the past 24 hour(s))  CBC with Differential/Platelet     Status: None   Collection Time: 06/21/20 11:34 PM  Result Value Ref Range   WBC 10.2 4.0 - 10.5 K/uL   RBC 4.24 3.87 - 5.11 MIL/uL   Hemoglobin 13.1 12.0 - 15.0 g/dL   HCT 16.138.4 09.636.0 - 04.546.0 %   MCV 90.6 80.0 -  100.0 fL   MCH 30.9 26.0 - 34.0 pg   MCHC 34.1 30.0 - 36.0 g/dL   RDW 85.8 85.0 - 27.7 %   Platelets 289 150 - 400 K/uL   nRBC 0.0 0.0 - 0.2 %   Neutrophils Relative % 52 %   Neutro Abs 5.4 1.7 - 7.7 K/uL   Lymphocytes Relative 36 %   Lymphs Abs 3.7 0.7 - 4.0 K/uL   Monocytes Relative 7 %   Monocytes Absolute 0.7 0.1 - 1.0 K/uL   Eosinophils Relative 3 %   Eosinophils Absolute 0.3 0.0 - 0.5 K/uL   Basophils Relative 1 %   Basophils Absolute 0.1 0.0 - 0.1 K/uL   Immature Granulocytes 1 %   Abs Immature Granulocytes 0.05 0.00 - 0.07 K/uL  Basic metabolic panel     Status: Abnormal   Collection Time: 06/21/20 11:34 PM  Result Value Ref Range   Sodium 139 135 - 145 mmol/L   Potassium 3.9 3.5 - 5.1 mmol/L   Chloride 104 98 - 111 mmol/L   CO2 27 22 - 32 mmol/L   Glucose, Bld 126 (H) 70 - 99 mg/dL   BUN 19 6 - 20 mg/dL   Creatinine, Ser 4.12 0.44 - 1.00 mg/dL   Calcium 8.9 8.9 - 87.8 mg/dL   GFR, Estimated >67 >67 mL/min   Anion gap 8 5 - 15  Troponin I (High Sensitivity)     Status: None   Collection Time: 06/21/20 11:38 PM  Result Value Ref Range   Troponin I (High Sensitivity) 3 <18 ng/L   Imaging Studies: CT Soft Tissue Neck W Contrast  Result Date: 06/22/2020 CLINICAL DATA:  Left-sided facial pain EXAM: CT NECK WITH CONTRAST TECHNIQUE: Multidetector CT imaging of the neck was performed using the standard protocol following the bolus administration of intravenous contrast. CONTRAST:  OMNIPAQUE IOHEXOL  300 MG/ML  SOLN COMPARISON:  None. FINDINGS: PHARYNX AND LARYNX: The nasopharynx, oropharynx and larynx are normal. Visible portions of the oral cavity, tongue base and floor of mouth are normal. Normal epiglottis, vallecula and pyriform sinuses. The larynx is normal. No retropharyngeal abscess, effusion or lymphadenopathy. SALIVARY GLANDS: Normal parotid, submandibular and sublingual glands. THYROID: Normal. LYMPH NODES: No enlarged or abnormal density lymph nodes. VASCULAR: Major cervical vessels are patent. LIMITED INTRACRANIAL: Normal. VISUALIZED ORBITS: Normal. MASTOIDS AND VISUALIZED PARANASAL SINUSES: No fluid levels or advanced mucosal thickening. No mastoid effusion. SKELETON: No bony spinal canal stenosis. No lytic or blastic lesions. Multilevel degenerative disc disease. UPPER CHEST: Clear. OTHER: None. IMPRESSION: Normal CT of the neck. Electronically Signed   By: Deatra Robinson M.D.   On: 06/22/2020 00:43    ED COURSE and MDM  Nursing notes, initial and subsequent vitals signs, including pulse oximetry, reviewed and interpreted by myself.  Vitals:   06/21/20 2214 06/21/20 2215 06/21/20 2335 06/22/20 0030  BP: (!) 136/57  133/60 (!) 124/53  Pulse: 73  73 72  Resp: 18  16 16   Temp: 98 F (36.7 C)     TempSrc: Oral     SpO2: 96%  99% 97%  Weight:  74.8 kg    Height:  5\' 4"  (1.626 m)     Medications  iohexol (OMNIPAQUE) 300 MG/ML solution 100 mL (100 mLs Intravenous Contrast Given 06/22/20 0006)   1:56 AM The cause of the patient's facial pain is unclear.  It is atypical for trigeminal neuralgia.  Although her left jugulodigastric lymph node is tender it is not enlarged on exam  or on CT.  The patient has an appointment pending with her dentist for further evaluation.  She is already on gabapentin for diabetic neuropathy.   PROCEDURES  Procedures   ED DIAGNOSES     ICD-10-CM   1. Neck pain on left side  M54.2        Naiyana Barbian, Jonny Ruiz, MD 06/22/20 [phone]

## 2020-06-22 ENCOUNTER — Emergency Department (HOSPITAL_BASED_OUTPATIENT_CLINIC_OR_DEPARTMENT_OTHER): Payer: 59

## 2020-06-22 LAB — TROPONIN I (HIGH SENSITIVITY): Troponin I (High Sensitivity): 3 ng/L (ref <18)

## 2020-06-22 MED ORDER — OXYCODONE-ACETAMINOPHEN 5-325 MG PO TABS: 1.0000 | ORAL_TABLET | Freq: Four times a day (QID) | ORAL | 0 refills | Status: DC | PRN

## 2020-06-22 MED ORDER — IOHEXOL 300 MG/ML  SOLN
100.0000 mL | Freq: Once | INTRAMUSCULAR | Status: AC | PRN
  Administered 2020-06-22: 100 mL via INTRAVENOUS

## 2020-06-23 ENCOUNTER — Telehealth: Payer: Self-pay | Admitting: Physician Assistant

## 2020-06-23 ENCOUNTER — Ambulatory Visit: Payer: 59 | Admitting: Nurse Practitioner

## 2020-06-23 ENCOUNTER — Other Ambulatory Visit: Payer: Self-pay

## 2020-06-23 ENCOUNTER — Encounter: Payer: Self-pay | Admitting: Nurse Practitioner

## 2020-06-23 VITALS — BP 105/62 | HR 63 | Temp 97.2°F | Ht 64.0 in | Wt 166.6 lb

## 2020-06-23 DIAGNOSIS — J0110 Acute frontal sinusitis, unspecified: Secondary | ICD-10-CM

## 2020-06-23 DIAGNOSIS — G629 Polyneuropathy, unspecified: Secondary | ICD-10-CM | POA: Diagnosis not present

## 2020-06-23 DIAGNOSIS — E1140 Type 2 diabetes mellitus with diabetic neuropathy, unspecified: Secondary | ICD-10-CM

## 2020-06-23 DIAGNOSIS — R599 Enlarged lymph nodes, unspecified: Secondary | ICD-10-CM | POA: Diagnosis not present

## 2020-06-23 DIAGNOSIS — Z1231 Encounter for screening mammogram for malignant neoplasm of breast: Secondary | ICD-10-CM

## 2020-06-23 DIAGNOSIS — R0989 Other specified symptoms and signs involving the circulatory and respiratory systems: Secondary | ICD-10-CM | POA: Diagnosis not present

## 2020-06-23 MED ORDER — AZITHROMYCIN 250 MG PO TABS: ORAL_TABLET | ORAL | 0 refills | Status: DC

## 2020-06-23 MED ORDER — BLOOD GLUCOSE METER KIT: PACK | 0 refills | Status: DC

## 2020-06-23 MED ORDER — GABAPENTIN 100 MG PO CAPS: 100.0000 mg | ORAL_CAPSULE | Freq: Two times a day (BID) | ORAL | 1 refills | Status: DC

## 2020-06-23 MED ORDER — METFORMIN HCL 500 MG PO TABS: 250.0000 mg | ORAL_TABLET | Freq: Two times a day (BID) | ORAL | 3 refills | Status: DC

## 2020-06-23 NOTE — Telephone Encounter (Signed)
Order placed per standing order protocol. AS< CMA

## 2020-06-23 NOTE — Telephone Encounter (Signed)
Pt added to [facility] schedule for today. Pt is aware of work in apt. AS, CMA

## 2020-06-23 NOTE — Progress Notes (Signed)
Established Patient Office Visit  Subjective:  Patient ID: Mary Wade, female    DOB: [birth year]  Age: 60 y.o. MRN: [id number]  CC:  Chief Complaint  Patient presents with  . Hospitalization Follow-up    HPI LILLYONNA Wade presents for follow up of recent visit to ER. She states that on Saturday, she started noticing left sided neck pain. Pain was localized to area which contains many lymph nodes. She initially thought she would be getting sore throat. She states that the soreness spread into her left jaw and left ear. She also started to develop facial numbness on the left side of the face. She denies facial drooping or problems with her speech, such as slurred speech or garbled speech. She did not that when having to move her tongue to the left side, she would get more severe pain in the left side of the neck where pain originated. She did go to urgent Care on Sunday, Jun 22, 2020. They did a CT scan of her neck which was normal. Troponin level was normal blood sugar was 126, and though WBC count was normal, it had increased from 6.2 to 10.2 in just a few weeks. Of particular note, when she had routine, fasting labs done in 05/2020, her HgbA1c was 7.1. previously, in 09/2019, it had been 5.9, and prior to that, her HgBA1c had been normal. She has not been diagnosed with diabetes.  She was treated for sinus infection about two weeks ago. She was treated with a z-pack. She states that her symptoms did resolve, but where she hurts in her neck, is very similar to where she had lymph node enlargement in the past. She denies presence of chest pain or chest pressure. She states that she has had a few more palpitations than usual. Denies shortness of breath.  She states that she did go to see her dentist this morning. Panoramic x-rays were done and no abnormalities were seen in her jaww. She continues to have the left facial numbness and swelling. States that the very localized area in her left neck is  still bothering her.    Past Medical History:  Diagnosis Date  . Aortic insufficiency    a. 10/2013: mod AI by echo.  . Bicuspid aortic valve    a. 10/24/13 showed EF 55-60%, no RWMA, bicuspid AV with mod AI, mildly dilated ascending aorta, PASP 72mHg.  .Marland KitchenCerebral aneurysm   . Chronic diarrhea   . Fibromyalgia   . Gallstones   . GERD (gastroesophageal reflux disease)   . Heart murmur   . Hyperlipidemia   . Hypertension   . Pancreatitis    2 previous episodes, last one 2013  . PVC's (premature ventricular contractions)    a. 10/2013 event monitor: NSR and PVCs.  . Tobacco abuse     Past Surgical History:  Procedure Laterality Date  . ABDOMINAL HYSTERECTOMY    . APPENDECTOMY    . CHOLECYSTECTOMY      Family History  Problem Relation Age of Onset  . CAD Mother        Died at 729 several blockages but patient unknown when this started  . Aneurysm Mother        Brain aneurysm - died of this during surgery  . Hypertension Mother   . Hyperlipidemia Mother   . Thyroid disease Mother   . Heart disease Father        Died of MI at age 735- enlarged heart  .  Heart attack Father   . Early death Father   . Diabetes Paternal Grandmother   . Esophageal cancer Cousin   . Ulcerative colitis Daughter   . Diabetes Daughter   . Diabetes Son     Social History   Socioeconomic History  . Marital status: Single    Spouse name: Not on file  . Number of children: 2  . Years of education: Not on file  . Highest education level: Not on file  Occupational History    Employer: [employer]    Comment: Dental office  Tobacco Use  . Smoking status: Former Smoker    Packs/day: 1.00    Years: 42.00    Pack years: 42.00    Types: Cigarettes    Quit date: 01/23/2016    Years since quitting: 4.4  . Smokeless tobacco: Never Used  Vaping Use  . Vaping Use: Never used  Substance and Sexual Activity  . Alcohol use: Yes    Alcohol/week: 0.0 standard drinks    Comment: twice per week - 2-6  beers on each occasion  . Drug use: No  . Sexual activity: Yes  Other Topics Concern  . Not on file  Social History Narrative   Single mother   Daughter and son, about to adopt a foster child in upcoming 2017   Dental hygienist   Social Determinants of Health   Financial Resource Strain: Not on file  Food Insecurity: Not on file  Transportation Needs: Not on file  Physical Activity: Not on file  Stress: Not on file  Social Connections: Not on file  Intimate Partner Violence: Not on file    Outpatient Medications Prior to Visit  Medication Sig Dispense Refill  . albuterol (VENTOLIN HFA) 108 (90 Base) MCG/ACT inhaler Inhale 2 puffs into the lungs every 6 (six) hours as needed for wheezing or shortness of breath. 8 g 0  . ALPRAZolam (XANAX) 0.5 MG tablet Take 1 tablet (0.5 mg total) by mouth as needed. 30 tablet 0  . atorvastatin (LIPITOR) 10 MG tablet Take 1 tablet (10 mg total) by mouth daily. 30 tablet 1  . bisoprolol-hydrochlorothiazide (ZIAC) 10-6.25 MG tablet Take 1 tablet by mouth daily. 90 tablet 1  . Cholecalciferol (VITAMIN D3) 5000 units CAPS Take 1 capsule (5,000 Units total) by mouth daily. 30 capsule   . fluticasone (FLONASE) 50 MCG/ACT nasal spray Place 2 sprays into both nostrils daily. 16 g 6  . omeprazole (PRILOSEC) 20 MG capsule Take 2 capsules (40 mg total) by mouth daily. 30 capsule 11  . oxyCODONE-acetaminophen (PERCOCET) 5-325 MG tablet Take 1 tablet by mouth every 6 (six) hours as needed for severe pain. 12 tablet 0  . Vitamin D, Ergocalciferol, (DRISDOL) 1.25 MG (50000 UNIT) CAPS capsule Take 1 capsule (50,000 Units total) by mouth every 7 (seven) days. 12 capsule 1  . gabapentin (NEURONTIN) 100 MG capsule Take 1 capsule (100 mg total) by mouth at bedtime. 30 capsule 0  . famotidine (PEPCID) 40 MG tablet Take 1 tablet (40 mg total) by mouth daily for 5 days. 5 tablet 0  . ibuprofen (ADVIL,MOTRIN) 200 MG tablet Take 200 mg by mouth every 6 (six) hours as needed  for mild pain or moderate pain.    Marland Kitchen ondansetron (ZOFRAN) 4 MG tablet Take 1 tablet (4 mg total) by mouth every 6 (six) hours. 12 tablet 0   No facility-administered medications prior to visit.    Allergies  Allergen Reactions  . Crestor [Rosuvastatin]  Mood changes  . Pravastatin     Muscle aches, elevated LFTs    ROS Review of Systems  Constitutional: Positive for fatigue. Negative for activity change, chills, fever and unexpected weight change.  HENT: Positive for dental problem, ear pain, sinus pressure and sore throat. Negative for congestion, postnasal drip, rhinorrhea and sinus pain.   Eyes: Negative.   Respiratory: Negative for cough, chest tightness, shortness of breath and wheezing.   Cardiovascular: Positive for palpitations. Negative for chest pain.  Gastrointestinal: Negative for constipation, diarrhea, nausea and vomiting.  Endocrine: Negative for cold intolerance, heat intolerance, polydipsia and polyuria.       Blood sugars elevated. Most recent HgbA1c done 05/2020 is 7.1.   Musculoskeletal: Negative for arthralgias, back pain and myalgias.  Skin: Negative for rash.  Allergic/Immunologic: Negative.   Neurological: Positive for weakness and numbness. Negative for dizziness and headaches.  Hematological: Positive for adenopathy.  Psychiatric/Behavioral: The patient is not nervous/anxious.   All other systems reviewed and are negative.     Objective:    Physical Exam Vitals and nursing note reviewed.  Constitutional:      Appearance: Normal appearance. She is ill-appearing.  HENT:     Right Ear: External ear normal.     Left Ear: External ear normal.     Nose: Nose normal.  Eyes:     Extraocular Movements: Extraocular movements intact.     Conjunctiva/sclera: Conjunctivae normal.     Pupils: Pupils are equal, round, and reactive to light.  Neck:     Comments: Slight cervical lymphadenopathy on the left side of the neck.  Cardiovascular:     Rate and  Rhythm: Normal rate and regular rhythm.     Pulses: Normal pulses.     Heart sounds: Normal heart sounds.  Pulmonary:     Effort: Pulmonary effort is normal.     Breath sounds: Normal breath sounds.  Abdominal:     General: Bowel sounds are normal.     Palpations: Abdomen is soft.     Tenderness: There is no abdominal tenderness.  Musculoskeletal:        General: Normal range of motion.     Cervical back: Tenderness present.  Lymphadenopathy:     Cervical: Cervical adenopathy present.  Skin:    General: Skin is warm and dry.  Neurological:     Mental Status: She is alert and oriented to person, place, and time.     Cranial Nerves: No cranial nerve deficit.     Sensory: No sensory deficit.     Motor: No weakness.     Coordination: Coordination normal.     Gait: Gait normal.     Deep Tendon Reflexes: Reflexes normal.  Psychiatric:        Mood and Affect: Mood normal.        Behavior: Behavior normal.        Thought Content: Thought content normal.        Judgment: Judgment normal.     Today's Vitals   06/23/20 1519 06/23/20 1613  BP: (!) 93/57 105/62  Pulse: 67 63  Temp: (!) 97.2 F (36.2 C)   SpO2: 98%   Weight: 166 lb 9.6 oz (75.6 kg)   Height: 5' 4" (1.626 m)    Body mass index is 28.6 kg/m.   Wt Readings from Last 3 Encounters:  06/23/20 166 lb 9.6 oz (75.6 kg)  06/21/20 165 lb (74.8 kg)  06/13/20 169 lb 9.6 oz (76.9 kg)  Health Maintenance Due  Topic Date Due  . COVID-19 Vaccine (1) Never done  . FOOT EXAM  Never done  . OPHTHALMOLOGY EXAM  Never done  . MAMMOGRAM  Never done    There are no preventive care reminders to display for this patient.  Lab Results  Component Value Date   TSH 1.100 10/26/2019   Lab Results  Component Value Date   WBC 10.2 06/21/2020   HGB 13.1 06/21/2020   HCT 38.4 06/21/2020   MCV 90.6 06/21/2020   PLT 289 06/21/2020   Lab Results  Component Value Date   NA 139 06/21/2020   K 3.9 06/21/2020   CO2 27  06/21/2020   GLUCOSE 126 (H) 06/21/2020   BUN 19 06/21/2020   CREATININE 0.57 06/21/2020   BILITOT 0.3 06/06/2020   ALKPHOS 67 06/06/2020   AST 23 06/06/2020   ALT 33 (H) 06/06/2020   PROT 6.6 06/06/2020   ALBUMIN 4.2 06/06/2020   CALCIUM 8.9 06/21/2020   ANIONGAP 8 06/21/2020   EGFR 112 06/06/2020   Lab Results  Component Value Date   CHOL 237 (H) 06/06/2020   Lab Results  Component Value Date   HDL 33 (L) 06/06/2020   Lab Results  Component Value Date   LDLCALC 124 (H) 06/06/2020   Lab Results  Component Value Date   TRIG 449 (H) 06/06/2020   Lab Results  Component Value Date   CHOLHDL 7.2 (H) 06/06/2020   Lab Results  Component Value Date   HGBA1C 7.1 (H) 06/06/2020      Assessment & Plan:  1. Type 2 diabetes mellitus with diabetic neuropathy, without long-term current use of insulin (HCC) Most recent HgbA1c 7.1. unclear if elevation in blood sugars are contributing to current symptoms. Will start metformin 26m twice daily. Prescription for glucose meter with test strips and lancets sent to her pharmacy. Advised her to check blood sugars once daily and as needed for unusual signs and symptoms. Discussed symptoms related to hyper and hypoglycemia. Diet and lifestyle suggestions discussed. Written information provided.  - metFORMIN (GLUCOPHAGE) 500 MG tablet; Take 0.5 tablets (250 mg total) by mouth 2 (two) times daily with a meal.  Dispense: 30 tablet; Refill: 3 - blood glucose meter kit and supplies; Dispense based on patient and insurance preference. Use up to four times daily as directed. (FOR ICD-10 E10.9, E11.9).  Dispense: 1 each; Refill: 0  2. Lymph node enlargement Reviewed CT scan done in Urgent Care which was negative for all acute abnormalities or problems.   3. Neuropathy Facial neuropathy. Was started on gabapentin for peripheral neuropathy in her feet. Takes this only on as needed basis. Will have her start taking this twice daily to see if that  will help with facial tingling and numbness.  - gabapentin (NEURONTIN) 100 MG capsule; Take 1 capsule (100 mg total) by mouth 2 (two) times daily.  Dispense: 60 capsule; Refill: 1  4. Right carotid bruit Carotid doppler study ordered today for further evaluation and treatment.  - [place] Bilateral; Future  5. Acute non-recurrent frontal sinusitis May be fighting off infection. Add z-pack. Take as directed for 5 days. Continue with OTC medications as needed and as indicated.  - azithromycin (ZITHROMAX) 250 MG tablet; z-pack - take as directed for 5 days  Dispense: 6 tablet; Refill: 0   Problem List Items Addressed This Visit      Respiratory   Acute frontal sinusitis   Relevant Medications   azithromycin (ZITHROMAX) 250 MG  tablet     Endocrine   Type 2 diabetes mellitus with diabetic neuropathy, without long-term current use of insulin (HCC) - Primary   Relevant Medications   metFORMIN (GLUCOPHAGE) 500 MG tablet   blood glucose meter kit and supplies     Nervous and Auditory   Neuropathy   Relevant Medications   gabapentin (NEURONTIN) 100 MG capsule     Immune and Lymphatic   Lymph node enlargement     Other   Right carotid bruit (Chronic)   Relevant Orders   US Carotid Bilateral      Meds ordered this encounter  Medications  . azithromycin (ZITHROMAX) 250 MG tablet    Sig: z-pack - take as directed for 5 days    Dispense:  6 tablet    Refill:  0    Order Specific Question:   Supervising Provider    Answer:   Beatrice Lecher D [[id number]]  . metFORMIN (GLUCOPHAGE) 500 MG tablet    Sig: Take 0.5 tablets (250 mg total) by mouth 2 (two) times daily with a meal.    Dispense:  30 tablet    Refill:  3    Order Specific Question:   Supervising Provider    Answer:   Beatrice Lecher D [[id number]]  . gabapentin (NEURONTIN) 100 MG capsule    Sig: Take 1 capsule (100 mg total) by mouth 2 (two) times daily.    Dispense:  60 capsule    Refill:  1    Order Specific  Question:   Supervising Provider    Answer:   Beatrice Lecher D [[id number]]  . blood glucose meter kit and supplies    Sig: Dispense based on patient and insurance preference. Use up to four times daily as directed. (FOR ICD-10 E10.9, E11.9).    Dispense:  1 each    Refill:  0    Order Specific Question:   Supervising Provider    Answer:   Hali Marry [[id number]]    Order Specific Question:   Number of strips    Answer:   100    Order Specific Question:   Number of lancets    Answer:   100    Follow-up: Return in about 4 weeks (around 07/21/2020) for diabetes and neuropathy - bring blood sugar log .    Ronnell Freshwater, NP

## 2020-06-23 NOTE — Telephone Encounter (Signed)
Patient was in ER for her jaw and she had pain and would like a follow up today which our schedule is full. Patient has numbness in her face, Please advise, thanks.

## 2020-06-23 NOTE — Patient Instructions (Signed)
Trigeminal Neuralgia  Trigeminal neuralgia is a nerve disorder that causes severe pain on one side of the face. The pain may last from a few seconds to several minutes. The pain is usually only on one side of the face. Symptoms may occur for days, weeks, or months and then go away for months or years. The pain may return and be worse than before. What are the causes? This condition is caused by damage or pressure to a nerve in the head that is called the trigeminal nerve. An attack can be triggered by:  Talking.  Chewing.  Putting on makeup.  Washing your face.  Shaving your face.  Brushing your teeth.  Touching your face. What increases the risk? You are more likely to develop this condition if you:  Are 50 years of age or older.  Are female. What are the signs or symptoms? The main symptom of this condition is severe pain in the:  Jaw.  Lips.  Eyes.  Nose.  Scalp.  Forehead.  Face. The pain may be:  Intense.  Stabbing.  Electric.  Shock-like. How is this diagnosed? This condition is diagnosed with a physical exam. A CT scan or an MRI may be done to rule out other conditions that can cause facial pain. How is this treated? This condition may be treated with:  Avoiding the things that trigger your symptoms.  Taking prescription medicines (anticonvulsants).  Having surgery. This may be done in severe cases if other medical treatment does not provide relief.  Having procedures such as ablation, thermal, or radiation therapy. It may take up to one month for treatment to start relieving the pain. Follow these instructions at home: Managing pain  Learn as much as you can about how to manage your pain. Ask your health care provider if a pain specialist would be helpful.  Consider talking with a mental health care provider (psychologist) about how to cope with the pain.  Consider joining a pain support group. General instructions  Take  over-the-counter and prescription medicines only as told by your health care provider.  Avoid the things that trigger your symptoms. It may help to: ? Chew on the unaffected side of your mouth. ? Avoid touching your face. ? Avoid blasts of hot or cold air.  Follow your treatment plan as told by your health care provider. This may include: ? Cognitive or behavioral therapy. ? Gentle, regular exercise. ? Meditation or yoga. ? Aromatherapy.  Keep all follow-up visits as told by your health care provider. You may need to be monitored closely to make sure treatment is working well for you. Where to find more information  Facial Pain Association: fpa-support.org Contact a health care provider if:  Your medicine is not helping your symptoms.  You have side effects from the medicine used for treatment.  You develop new, unexplained symptoms, such as: ? Double vision. ? Facial weakness. ? Facial numbness. ? Changes in hearing or balance.  You feel depressed. Get help right away if:  Your pain is severe and is not getting better.  You develop suicidal thoughts. If you ever feel like you may hurt yourself or others, or have thoughts about taking your own life, get help right away. You can go to your nearest emergency department or call:  Your local emergency services (911 in the [place]  A suicide crisis helpline, such as the National Suicide Prevention Lifeline at 1-800-273-8255. This is open 24 hours a day. Summary  Trigeminal neuralgia is a   nerve disorder that causes severe pain on one side of the face. The pain may last from a few seconds to several minutes.  This condition is caused by damage or pressure to a nerve in the head that is called the trigeminal nerve.  Treatment may include avoiding the things that trigger your symptoms, taking medicines, or having surgery or procedures. It may take up to one month for treatment to start relieving the pain.  Avoid the things that  trigger your symptoms.  Keep all follow-up visits as told by your health care provider. You may need to be monitored closely to make sure treatment is working well for you. This information is not intended to replace advice given to you by your health care provider. Make sure you discuss any questions you have with your health care provider. Document Revised: 12/19/2017 Document Reviewed: 12/19/2017 Elsevier Patient Education  2021 Elsevier Inc.  

## 2020-06-24 ENCOUNTER — Other Ambulatory Visit: Payer: Self-pay | Admitting: Physician Assistant

## 2020-06-24 ENCOUNTER — Telehealth: Payer: Self-pay | Admitting: Physician Assistant

## 2020-06-24 DIAGNOSIS — R599 Enlarged lymph nodes, unspecified: Secondary | ICD-10-CM | POA: Insufficient documentation

## 2020-06-24 DIAGNOSIS — E1140 Type 2 diabetes mellitus with diabetic neuropathy, unspecified: Secondary | ICD-10-CM

## 2020-06-24 MED ORDER — BLOOD GLUCOSE METER KIT: PACK | 0 refills | Status: DC

## 2020-06-24 NOTE — Telephone Encounter (Signed)
Called while you were with patient, asked to speak with you. I advised I would send you a message. Thank you

## 2020-06-24 NOTE — Telephone Encounter (Signed)
Patient scheduled for Doppler US 06/25/20 at 745am.   Glucometer and supplies sent to requested pharmacy. AS, CMA

## 2020-06-24 NOTE — Telephone Encounter (Signed)
[address] [facility]  06/25/20 745am Doppler.

## 2020-06-25 ENCOUNTER — Ambulatory Visit
Admission: RE | Admit: 2020-06-25 | Discharge: 2020-06-25 | Disposition: A | Discharge status: Home / Self Care | Payer: 59 | Source: Ambulatory Visit | Attending: Nurse Practitioner | Admitting: Nurse Practitioner

## 2020-06-25 DIAGNOSIS — R0989 Other specified symptoms and signs involving the circulatory and respiratory systems: Secondary | ICD-10-CM

## 2020-06-25 NOTE — Progress Notes (Signed)
Overall, this looks good. No significant stenosis. Arteries patent with good flow.

## 2020-07-18 ENCOUNTER — Other Ambulatory Visit: Payer: Self-pay

## 2020-07-18 ENCOUNTER — Other Ambulatory Visit: Payer: Self-pay | Admitting: Physician Assistant

## 2020-07-18 ENCOUNTER — Other Ambulatory Visit: Payer: 59

## 2020-07-18 DIAGNOSIS — E782 Mixed hyperlipidemia: Secondary | ICD-10-CM

## 2020-07-18 DIAGNOSIS — Z0000 Encounter for general adult medical examination without abnormal findings: Secondary | ICD-10-CM

## 2020-07-19 LAB — HEPATIC FUNCTION PANEL
ALT: 31 IU/L (ref 0–32)
AST: 22 IU/L (ref 0–40)
Albumin: 4.3 g/dL (ref 3.8–4.9)
Alkaline Phosphatase: 74 IU/L (ref 44–121)
Bilirubin Total: 0.2 mg/dL (ref 0.0–1.2)
Bilirubin, Direct: <0.1 mg/dL (ref 0.00–0.40)
Total Protein: 6.5 g/dL (ref 6.0–8.5)

## 2020-07-19 LAB — LIPID PANEL
Chol/HDL Ratio: 7.9 ratio — ABNORMAL HIGH (ref 0.0–4.4)
Cholesterol, Total: 246 mg/dL — ABNORMAL HIGH (ref 100–199)
HDL: 31 mg/dL — ABNORMAL LOW (ref >39)
LDL Chol Calc (NIH): 102 mg/dL — ABNORMAL HIGH (ref 0–99)
Triglycerides: 658 mg/dL (ref 0–149)
VLDL Cholesterol Cal: 113 mg/dL — ABNORMAL HIGH (ref 5–40)

## 2020-07-22 MED ORDER — OMEGA-3-ACID ETHYL ESTERS 1 G PO CAPS: 1.0000 g | ORAL_CAPSULE | Freq: Every day | ORAL | 0 refills | Status: DC

## 2020-07-22 NOTE — Addendum Note (Signed)
Addended by: Sylvester Harder on: 07/22/2020 11:00 AM   Modules accepted: Orders

## 2020-08-11 ENCOUNTER — Other Ambulatory Visit: Payer: Self-pay | Admitting: Physician Assistant

## 2020-08-11 DIAGNOSIS — I10 Essential (primary) hypertension: Secondary | ICD-10-CM

## 2020-08-15 ENCOUNTER — Other Ambulatory Visit: Payer: Self-pay | Admitting: Physician Assistant

## 2020-08-15 DIAGNOSIS — I10 Essential (primary) hypertension: Secondary | ICD-10-CM

## 2020-08-15 DIAGNOSIS — E782 Mixed hyperlipidemia: Secondary | ICD-10-CM

## 2020-08-15 DIAGNOSIS — Z0000 Encounter for general adult medical examination without abnormal findings: Secondary | ICD-10-CM

## 2020-08-20 ENCOUNTER — Telehealth: Payer: Self-pay | Admitting: Physician Assistant

## 2020-08-20 ENCOUNTER — Other Ambulatory Visit: Payer: Self-pay | Admitting: Nurse Practitioner

## 2020-08-20 DIAGNOSIS — H109 Unspecified conjunctivitis: Secondary | ICD-10-CM

## 2020-08-20 MED ORDER — POLYMYXIN B-TRIMETHOPRIM 10000-0.1 UNIT/ML-% OP SOLN: 1.0000 [drp] | OPHTHALMIC | 0 refills | Status: DC

## 2020-08-20 NOTE — Telephone Encounter (Signed)
Patient calling office stating she has pink eye and is requesting treatment of this. Please advise. AS, CMA

## 2020-08-20 NOTE — Telephone Encounter (Signed)
Please let the patient know that prescription for polytrim eye drops sent to [facility]. She should insert 1 drop in effected eye every 4 hours while awake for next week. Recommend warm compresses to eyes to soothe and reduce build up and caking of drainage. Let us know if no improvement over next few days. Thanks.

## 2020-08-20 NOTE — Telephone Encounter (Signed)
Patient advised of the below and verbalized understanding. AS, CMA 

## 2020-08-20 NOTE — Progress Notes (Signed)
Prescription for polytrim eye drops sent to [facility]. She should insert 1 drop in effected eye every 4 hours while awake for next week. Recommend warm compresses to eyes to soothe and reduce build up and caking of drainage. Let us know if no improvement over next few days.

## 2020-09-02 ENCOUNTER — Other Ambulatory Visit: Payer: Self-pay

## 2020-09-02 ENCOUNTER — Encounter: Payer: Self-pay | Admitting: Nurse Practitioner

## 2020-09-02 ENCOUNTER — Ambulatory Visit (INDEPENDENT_AMBULATORY_CARE_PROVIDER_SITE_OTHER): Payer: 59 | Admitting: Nurse Practitioner

## 2020-09-02 VITALS — BP 105/60 | HR 70 | Temp 97.3°F | Ht 64.0 in | Wt 166.7 lb

## 2020-09-02 DIAGNOSIS — J0110 Acute frontal sinusitis, unspecified: Secondary | ICD-10-CM | POA: Diagnosis not present

## 2020-09-02 DIAGNOSIS — H109 Unspecified conjunctivitis: Secondary | ICD-10-CM

## 2020-09-02 MED ORDER — ERYTHROMYCIN 5 MG/GM OP OINT
TOPICAL_OINTMENT | OPHTHALMIC | 0 refills | Status: DC
Start: 2020-09-02 — End: 2021-04-30

## 2020-09-02 NOTE — Progress Notes (Signed)
Acute Office Visit  Subjective:    Patient ID: Mary Wade, female    DOB: Oct 10, 1960, 60 y.o.   MRN: [id number]  Chief Complaint  Patient presents with   Conjunctivitis    HPI Patient is in today for evaluation of conjunctivitis.  Patient states that around 4 July she developed eye infection.  She was treated with Polytrim eyedrops, which she used for about 4 days.  She states symptoms got better and she stopped using antibiotic eyedrops.  She states over the last few days her eyes have felt weak and itchy.  States that at 3 AM this morning, she woke up with both eyes matted shut.  They were very itchy, red, and irritated.  She did use the previously prescribed Polytrim eyedrops 3 times over the course of today.  She has noticed a little bit of improvement.  She is reporting a film over her field of vision since this morning.  She denies fever.  She denies headaches.  She denies contacts with pinkeye.  She denies nasal congestion, ear pain, sore throat, or body aches and pains.  Past Medical History:  Diagnosis Date   Aortic insufficiency    a. 10/2013: mod AI by echo.   Bicuspid aortic valve    a. 10/24/13 showed EF 55-60%, no RWMA, bicuspid AV with mod AI, mildly dilated ascending aorta, PASP 52mHg.   Cerebral aneurysm    Chronic diarrhea    Fibromyalgia    Gallstones    GERD (gastroesophageal reflux disease)    Heart murmur    Hyperlipidemia    Hypertension    Pancreatitis    2 previous episodes, last one 2013   PVC's (premature ventricular contractions)    a. 10/2013 event monitor: NSR and PVCs.   Tobacco abuse     Past Surgical History:  Procedure Laterality Date   ABDOMINAL HYSTERECTOMY     APPENDECTOMY     CHOLECYSTECTOMY      Family History  Problem Relation Age of Onset   CAD Mother        Died at 741 several blockages but patient unknown when this started   Aneurysm Mother        Brain aneurysm - died of this during surgery   Hypertension Mother     Hyperlipidemia Mother    Thyroid disease Mother    Heart disease Father        Died of MI at age 59- enlarged heart   Heart attack Father    Early death Father    Diabetes Paternal Grandmother    Esophageal cancer Cousin    Ulcerative colitis Daughter    Diabetes Daughter    Diabetes Son     Social History   Socioeconomic History   Marital status: Single    Spouse name: Not on file   Number of children: 2   Years of education: Not on file   Highest education level: Not on file  Occupational History    Employer: [employer]   Comment: Dental office  Tobacco Use   Smoking status: Former    Packs/day: 1.00    Years: 42.00    Pack years: 42.00    Types: Cigarettes    Quit date: 01/23/2016    Years since quitting: 4.6   Smokeless tobacco: Never  Vaping Use   Vaping Use: Never used  Substance and Sexual Activity   Alcohol use: Yes    Alcohol/week: 0.0 standard drinks    Comment:  twice per week - 2-6 beers on each occasion   Drug use: No   Sexual activity: Yes  Other Topics Concern   Not on file  Social History Narrative   Single mother   Daughter and son, about to adopt a foster child in upcoming 2017   Dental hygienist   Social Determinants of Health   Financial Resource Strain: Not on file  Food Insecurity: Not on file  Transportation Needs: Not on file  Physical Activity: Not on file  Stress: Not on file  Social Connections: Not on file  Intimate Partner Violence: Not on file    Outpatient Medications Prior to Visit  Medication Sig Dispense Refill   albuterol (VENTOLIN HFA) 108 (90 Base) MCG/ACT inhaler Inhale 2 puffs into the lungs every 6 (six) hours as needed for wheezing or shortness of breath. 8 g 0   ALPRAZolam (XANAX) 0.5 MG tablet Take 1 tablet (0.5 mg total) by mouth as needed. 30 tablet 0   atorvastatin (LIPITOR) 10 MG tablet TAKE 1 TABLET (10 MG TOTAL) BY MOUTH DAILY. 30 tablet 1   bisoprolol-hydrochlorothiazide (ZIAC) 10-6.25 MG tablet TAKE 1  TABLET BY MOUTH DAILY. 30 tablet 1   blood glucose meter kit and supplies Dispense based on patient and insurance preference. Use up to four times daily as directed. (FOR ICD-10 E10.9, E11.9). 1 each 0   blood glucose meter kit and supplies Dispense based on patient and insurance preference. Use to check fasting blood sugar and 2 hrs after largest meal.(FOR ICD-10 E10.9, E11.9). 1 each 0   Cholecalciferol (VITAMIN D3) 5000 units CAPS Take 1 capsule (5,000 Units total) by mouth daily. 30 capsule    fluticasone (FLONASE) 50 MCG/ACT nasal spray Place 2 sprays into both nostrils daily. 16 g 6   gabapentin (NEURONTIN) 100 MG capsule Take 1 capsule (100 mg total) by mouth 2 (two) times daily. 60 capsule 1   metFORMIN (GLUCOPHAGE) 500 MG tablet Take 0.5 tablets (250 mg total) by mouth 2 (two) times daily with a meal. 30 tablet 3   omega-3 acid ethyl esters (LOVAZA) 1 g capsule TAKE 1 CAPSULE BY MOUTH DAILY. 30 capsule 0   omeprazole (PRILOSEC) 20 MG capsule Take 2 capsules (40 mg total) by mouth daily. 30 capsule 11   oxyCODONE-acetaminophen (PERCOCET) 5-325 MG tablet Take 1 tablet by mouth every 6 (six) hours as needed for severe pain. 12 tablet 0   Vitamin D, Ergocalciferol, (DRISDOL) 1.25 MG (50000 UNIT) CAPS capsule Take 1 capsule (50,000 Units total) by mouth every 7 (seven) days. 12 capsule 1   azithromycin (ZITHROMAX) 250 MG tablet z-pack - take as directed for 5 days 6 tablet 0   trimethoprim-polymyxin b (POLYTRIM) ophthalmic solution Place 1 drop into both eyes every 4 (four) hours. 10 mL 0   famotidine (PEPCID) 40 MG tablet Take 1 tablet (40 mg total) by mouth daily for 5 days. 5 tablet 0   No facility-administered medications prior to visit.    Allergies  Allergen Reactions   Crestor [Rosuvastatin]     Mood changes   Pravastatin     Muscle aches, elevated LFTs    Review of Systems  HENT:  Positive for postnasal drip, rhinorrhea and sinus pressure. Negative for congestion, ear pain,  sinus pain and sore throat.   Eyes:  Positive for discharge, redness, itching and visual disturbance.       Has had itching with irritation and redness..  Woke up with eyes matted shut.  Eyes are  watering great deal.  Has noticed a film over the field of vision.  Has had little improvement since using previously prescribed Polytrim eyedrops 3 times since 3 AM this morning.  All other systems reviewed and are negative.     Objective:    Physical Exam Vitals and nursing note reviewed.  Constitutional:      Appearance: Normal appearance. She is well-developed.  HENT:     Head: Normocephalic.     Right Ear: Ear canal and external ear normal. Tympanic membrane is bulging.     Left Ear: Ear canal and external ear normal. Tympanic membrane is bulging.     Nose: Congestion present.     Right Sinus: Frontal sinus tenderness present.     Left Sinus: Frontal sinus tenderness present.     Mouth/Throat:     Mouth: Mucous membranes are dry.     Pharynx: Oropharynx is clear.  Eyes:     General:        Right eye: Discharge present.        Left eye: Discharge present.    Conjunctiva/sclera:     Right eye: Exudate present.     Left eye: Exudate present.     Pupils: Pupils are equal, round, and reactive to light.     Comments: Bilateral eyelids, both upper and lower, edematous.  Sclera very red and irritated appearing.  Excessive tearing present.  Conjunctiva very red with evidence of purulent drainage noted.  Cardiovascular:     Rate and Rhythm: Normal rate and regular rhythm.     Pulses: Normal pulses.     Heart sounds: Normal heart sounds.  Pulmonary:     Effort: Pulmonary effort is normal.     Breath sounds: Normal breath sounds.  Abdominal:     Palpations: Abdomen is soft.  Musculoskeletal:        General: Normal range of motion.     Cervical back: Normal range of motion and neck supple.  Lymphadenopathy:     Cervical: No cervical adenopathy.  Skin:    General: Skin is warm and dry.      Capillary Refill: Capillary refill takes less than 2 seconds.  Neurological:     General: No focal deficit present.     Mental Status: She is alert and oriented to person, place, and time.  Psychiatric:        Mood and Affect: Mood normal.        Behavior: Behavior normal.        Thought Content: Thought content normal.        Judgment: Judgment normal.    Today's Vitals   09/02/20 1502  BP: 105/60  Pulse: 70  Temp: (!) 97.3 F (36.3 C)  SpO2: 97%  Weight: 166 lb 11.2 oz (75.6 kg)   Body mass index is 28.61 kg/m.   Wt Readings from Last 3 Encounters:  09/02/20 166 lb 11.2 oz (75.6 kg)  06/23/20 166 lb 9.6 oz (75.6 kg)  06/21/20 165 lb (74.8 kg)    Health Maintenance Due  Topic Date Due   COVID-19 Vaccine (1) Never done   FOOT EXAM  Never done   OPHTHALMOLOGY EXAM  Never done   MAMMOGRAM  Never done   Zoster Vaccines- Shingrix (1 of 2) Never done   Pneumococcal Vaccine 24-50 Years old (2 - PCV) 10/18/2015   INFLUENZA VACCINE  09/15/2020    There are no preventive care reminders to display for this patient.   Lab Results  Component Value Date   TSH 1.100 10/26/2019   Lab Results  Component Value Date   WBC 10.2 06/21/2020   HGB 13.1 06/21/2020   HCT 38.4 06/21/2020   MCV 90.6 06/21/2020   PLT 289 06/21/2020   Lab Results  Component Value Date   NA 139 06/21/2020   K 3.9 06/21/2020   CO2 27 06/21/2020   GLUCOSE 126 (H) 06/21/2020   BUN 19 06/21/2020   CREATININE 0.57 06/21/2020   BILITOT 0.2 07/18/2020   ALKPHOS 74 07/18/2020   AST 22 07/18/2020   ALT 31 07/18/2020   PROT 6.5 07/18/2020   ALBUMIN 4.3 07/18/2020   CALCIUM 8.9 06/21/2020   ANIONGAP 8 06/21/2020   EGFR 112 06/06/2020   Lab Results  Component Value Date   CHOL 246 (H) 07/18/2020   Lab Results  Component Value Date   HDL 31 (L) 07/18/2020   Lab Results  Component Value Date   LDLCALC 102 (H) 07/18/2020   Lab Results  Component Value Date   TRIG 658 (HH) 07/18/2020    Lab Results  Component Value Date   CHOLHDL 7.9 (H) 07/18/2020   Lab Results  Component Value Date   HGBA1C 7.1 (H) 06/06/2020       Assessment & Plan:  1. Conjunctivitis, unspecified conjunctivitis type, unspecified laterality Change Polytrim eyedrops to erythromycin ophthalmic ointment.  She should place a 1 cm ribbon of ointment into the bottom conjunctiva 3 times a day for the next 7 days.  She should complete treatment for full 7 days regardless if improvement of symptoms occurs within 2 to 3 days.  She voiced understanding and agreement with this plan. - erythromycin ophthalmic ointment; Place 1cm ribbon ointment into eyes TID for next 7 days  Dispense: 3.5 g; Refill: 0  2. Acute non-recurrent frontal sinusitis Z-Pak was later started to help with symptoms associated with sinusitis.  She can take as directed for 5 days. Rest and increase fluids. Continue using OTC medication to control symptoms.    Problem List Items Addressed This Visit       Respiratory   Acute frontal sinusitis     Other   Conjunctivitis - Primary   Relevant Medications   erythromycin ophthalmic ointment     Meds ordered this encounter  Medications   erythromycin ophthalmic ointment    Sig: Place 1cm ribbon ointment into eyes TID for next 7 days    Dispense:  3.5 g    Refill:  0    Order Specific Question:   Supervising Provider    Answer:   Beatrice Lecher D [[id number]]     Ronnell Freshwater, NP

## 2020-09-17 DIAGNOSIS — H109 Unspecified conjunctivitis: Secondary | ICD-10-CM | POA: Insufficient documentation

## 2020-09-18 ENCOUNTER — Telehealth: Payer: Self-pay | Admitting: Physician Assistant

## 2020-09-18 NOTE — Telephone Encounter (Signed)
Patient called stating she is coughing, sneezing, and has a runny nose. Patient took a home COVID test and it was negative. Please advise, thanks.

## 2020-09-19 ENCOUNTER — Ambulatory Visit
Admission: EM | Admit: 2020-09-19 | Discharge: 2020-09-19 | Disposition: A | Discharge status: Home / Self Care | Payer: 59 | Attending: Urgent Care | Admitting: Urgent Care

## 2020-09-19 ENCOUNTER — Encounter: Payer: Self-pay | Admitting: Emergency Medicine

## 2020-09-19 ENCOUNTER — Ambulatory Visit (INDEPENDENT_AMBULATORY_CARE_PROVIDER_SITE_OTHER): Payer: 59

## 2020-09-19 ENCOUNTER — Other Ambulatory Visit: Payer: Self-pay

## 2020-09-19 DIAGNOSIS — R070 Pain in throat: Secondary | ICD-10-CM

## 2020-09-19 DIAGNOSIS — E119 Type 2 diabetes mellitus without complications: Secondary | ICD-10-CM

## 2020-09-19 DIAGNOSIS — R059 Cough, unspecified: Secondary | ICD-10-CM | POA: Diagnosis not present

## 2020-09-19 DIAGNOSIS — J069 Acute upper respiratory infection, unspecified: Secondary | ICD-10-CM

## 2020-09-19 DIAGNOSIS — Z87891 Personal history of nicotine dependence: Secondary | ICD-10-CM

## 2020-09-19 MED ORDER — PREDNISONE 20 MG PO TABS: ORAL_TABLET | ORAL | 0 refills | Status: DC

## 2020-09-19 MED ORDER — PROMETHAZINE-DM 6.25-15 MG/5ML PO SYRP: 5.0000 mL | ORAL_SOLUTION | Freq: Every evening | ORAL | 0 refills | Status: DC | PRN

## 2020-09-19 MED ORDER — BENZONATATE 100 MG PO CAPS: 100.0000 mg | ORAL_CAPSULE | Freq: Three times a day (TID) | ORAL | 0 refills | Status: DC | PRN

## 2020-09-19 NOTE — ED Provider Notes (Signed)
[facility]   MRN: [id number] DOB: 1960-03-25  Subjective:   Mary Wade is a 60 y.o. female presenting for 3 day history of acute onset dry hacking cough, headache, runny nose, throat pain. Feels heaviness and congestion in her chest, fatigue and is getting winded.  No fever, chest pain.  She is a former smoker.  Has a history of type 2 diabetes managed without insulin.  She is not COVID vaccinated.  Took 2 home tests and both were negative.  No current facility-administered medications for this encounter.  Current Outpatient Medications:    albuterol (VENTOLIN HFA) 108 (90 Base) MCG/ACT inhaler, Inhale 2 puffs into the lungs every 6 (six) hours as needed for wheezing or shortness of breath., Disp: 8 g, Rfl: 0   ALPRAZolam (XANAX) 0.5 MG tablet, Take 1 tablet (0.5 mg total) by mouth as needed., Disp: 30 tablet, Rfl: 0   atorvastatin (LIPITOR) 10 MG tablet, TAKE 1 TABLET (10 MG TOTAL) BY MOUTH DAILY., Disp: 30 tablet, Rfl: 1   bisoprolol-hydrochlorothiazide (ZIAC) 10-6.25 MG tablet, TAKE 1 TABLET BY MOUTH DAILY., Disp: 30 tablet, Rfl: 1   blood glucose meter kit and supplies, Dispense based on patient and insurance preference. Use up to four times daily as directed. (FOR ICD-10 E10.9, E11.9)., Disp: 1 each, Rfl: 0   blood glucose meter kit and supplies, Dispense based on patient and insurance preference. Use to check fasting blood sugar and 2 hrs after largest meal.(FOR ICD-10 E10.9, E11.9)., Disp: 1 each, Rfl: 0   Cholecalciferol (VITAMIN D3) 5000 units CAPS, Take 1 capsule (5,000 Units total) by mouth daily., Disp: 30 capsule, Rfl:    erythromycin ophthalmic ointment, Place 1cm ribbon ointment into eyes TID for next 7 days, Disp: 3.5 g, Rfl: 0   famotidine (PEPCID) 40 MG tablet, Take 1 tablet (40 mg total) by mouth daily for 5 days., Disp: 5 tablet, Rfl: 0   fluticasone (FLONASE) 50 MCG/ACT nasal spray, Place 2 sprays into both nostrils daily., Disp: 16 g, Rfl: 6   gabapentin  (NEURONTIN) 100 MG capsule, Take 1 capsule (100 mg total) by mouth 2 (two) times daily., Disp: 60 capsule, Rfl: 1   metFORMIN (GLUCOPHAGE) 500 MG tablet, Take 0.5 tablets (250 mg total) by mouth 2 (two) times daily with a meal., Disp: 30 tablet, Rfl: 3   omega-3 acid ethyl esters (LOVAZA) 1 g capsule, TAKE 1 CAPSULE BY MOUTH DAILY., Disp: 30 capsule, Rfl: 0   omeprazole (PRILOSEC) 20 MG capsule, Take 2 capsules (40 mg total) by mouth daily., Disp: 30 capsule, Rfl: 11   oxyCODONE-acetaminophen (PERCOCET) 5-325 MG tablet, Take 1 tablet by mouth every 6 (six) hours as needed for severe pain., Disp: 12 tablet, Rfl: 0   Vitamin D, Ergocalciferol, (DRISDOL) 1.25 MG (50000 UNIT) CAPS capsule, Take 1 capsule (50,000 Units total) by mouth every 7 (seven) days., Disp: 12 capsule, Rfl: 1   Allergies  Allergen Reactions   Crestor [Rosuvastatin]     Mood changes   Pravastatin     Muscle aches, elevated LFTs    Past Medical History:  Diagnosis Date   Aortic insufficiency    a. 10/2013: mod AI by echo.   Bicuspid aortic valve    a. 10/24/13 showed EF 55-60%, no RWMA, bicuspid AV with mod AI, mildly dilated ascending aorta, PASP 29mmHg.   Cerebral aneurysm    Chronic diarrhea    Fibromyalgia    Gallstones    GERD (gastroesophageal reflux disease)    Heart murmur  Hyperlipidemia    Hypertension    Pancreatitis    2 previous episodes, last one 2013   PVC's (premature ventricular contractions)    a. 10/2013 event monitor: NSR and PVCs.   Tobacco abuse      Past Surgical History:  Procedure Laterality Date   ABDOMINAL HYSTERECTOMY     APPENDECTOMY     CHOLECYSTECTOMY      Family History  Problem Relation Age of Onset   CAD Mother        Died at 45, several blockages but patient unknown when this started   Aneurysm Mother        Brain aneurysm - died of this during surgery   Hypertension Mother    Hyperlipidemia Mother    Thyroid disease Mother    Heart disease Father        Died of MI  at age 40 - enlarged heart   Heart attack Father    Early death Father    Diabetes Paternal Grandmother    Esophageal cancer Cousin    Ulcerative colitis Daughter    Diabetes Daughter    Diabetes Son     Social History   Tobacco Use   Smoking status: Former    Packs/day: 1.00    Years: 42.00    Pack years: 42.00    Types: Cigarettes    Quit date: 01/23/2016    Years since quitting: 4.6   Smokeless tobacco: Never  Vaping Use   Vaping Use: Never used  Substance Use Topics   Alcohol use: Yes    Alcohol/week: 0.0 standard drinks    Comment: twice per week - 2-6 beers on each occasion   Drug use: No    ROS   Objective:   Vitals: BP 134/73 (BP Location: Left Arm)   Pulse 78   Temp 97.6 F (36.4 C) (Oral)   Resp 16   SpO2 95%   Physical Exam Constitutional:      General: She is not in acute distress.    Appearance: Normal appearance. She is well-developed. She is not ill-appearing, toxic-appearing or diaphoretic.  HENT:     Head: Normocephalic and atraumatic.     Right Ear: External ear normal.     Left Ear: External ear normal.     Nose: Nose normal.     Mouth/Throat:     Mouth: Mucous membranes are moist.     Pharynx: No oropharyngeal exudate or posterior oropharyngeal erythema.     Comments: Significant post-nasal drainage. Eyes:     General: No scleral icterus.       Right eye: No discharge.        Left eye: No discharge.     Extraocular Movements: Extraocular movements intact.     Conjunctiva/sclera: Conjunctivae normal.     Pupils: Pupils are equal, round, and reactive to light.  Cardiovascular:     Rate and Rhythm: Normal rate and regular rhythm.     Pulses: Normal pulses.     Heart sounds: Normal heart sounds. No murmur heard.   No friction rub. No gallop.  Pulmonary:     Effort: Pulmonary effort is normal. No respiratory distress.     Breath sounds: Normal breath sounds. No stridor. No wheezing, rhonchi or rales.  Skin:    General: Skin is warm  and dry.     Findings: No rash.  Neurological:     General: No focal deficit present.     Mental Status: She is alert and oriented  to person, place, and time.  Psychiatric:        Mood and Affect: Mood normal.        Behavior: Behavior normal.        Thought Content: Thought content normal.        Judgment: Judgment normal.   Chest x-ray over read is pending.  Assessment and Plan :   PDMP not reviewed this encounter.  1. Viral URI with cough   2. Throat pain   3. Type 2 diabetes mellitus treated without insulin ([place])   4. Former smoker     Patient requesting an antibiotic course and hydrocodone cough syrup.  I declined both expressing concern about antibiotic stewardship.  Also do not recommend opioid cough medications as it can lead to respiratory suppression.  Will manage for viral illness such as viral URI, viral syndrome, viral rhinitis, COVID-19. Counseled patient on nature of COVID-19 including modes of transmission, diagnostic testing, management and supportive care.  Offered scripts for symptomatic relief. COVID 19 testing is pending.  In light of her history of smoking and chest symptoms, recommended an oral prednisone course.  Counseled patient on potential for adverse effects with medications prescribed/recommended today, ER and return-to-clinic precautions discussed, patient verbalized understanding.     Jaynee Eagles, [place] 09/19/20 [phone]

## 2020-09-19 NOTE — ED Triage Notes (Signed)
Dry non-productive cough, headache, runny nose, sore throat starting Tuesday. Denies fever. Two negative covid tests

## 2020-09-19 NOTE — Discharge Instructions (Addendum)
We will notify you of your COVID-19 test results as they arrive and may take between 24 to 48 hours.  I encourage you to sign up for MyChart if you have not already done so as this can be the easiest way for us to communicate results to you online or through a phone app.  In the meantime, if you develop worsening symptoms including fever, chest pain, shortness of breath despite our current treatment plan then please report to the emergency room as this may be a sign of worsening status from possible COVID-19 infection.  Otherwise, we will manage this as a viral syndrome. For sore throat or cough try using a honey-based tea. Use 3 teaspoons of honey with juice squeezed from half lemon. Place shaved pieces of ginger into 1/2-1 cup of water and warm over stove top. Then mix the ingredients and repeat every 4 hours as needed. Please take Tylenol 500mg-650mg every 6 hours for aches and pains, fevers. Hydrate very well with at least 2 liters of water. Eat light meals such as soups to replenish electrolytes and soft fruits, veggies.  

## 2020-09-20 LAB — NOVEL CORONAVIRUS, NAA: SARS-CoV-2, NAA: NOT DETECTED

## 2020-09-20 LAB — SARS-COV-2, NAA 2 DAY TAT

## 2020-10-04 ENCOUNTER — Other Ambulatory Visit: Payer: Self-pay | Admitting: Nurse Practitioner

## 2020-10-04 ENCOUNTER — Other Ambulatory Visit: Payer: Self-pay | Admitting: Physician Assistant

## 2020-10-04 DIAGNOSIS — E559 Vitamin D deficiency, unspecified: Secondary | ICD-10-CM

## 2020-10-04 DIAGNOSIS — I10 Essential (primary) hypertension: Secondary | ICD-10-CM

## 2020-10-04 DIAGNOSIS — G629 Polyneuropathy, unspecified: Secondary | ICD-10-CM

## 2020-10-04 DIAGNOSIS — E782 Mixed hyperlipidemia: Secondary | ICD-10-CM

## 2020-10-10 ENCOUNTER — Ambulatory Visit: Payer: 59 | Admitting: Physician Assistant

## 2020-10-10 ENCOUNTER — Other Ambulatory Visit: Payer: Self-pay

## 2020-10-10 ENCOUNTER — Encounter: Payer: Self-pay | Admitting: Physician Assistant

## 2020-10-10 VITALS — BP 136/77 | HR 80 | Temp 97.6°F | Ht 64.0 in | Wt 162.1 lb

## 2020-10-10 DIAGNOSIS — I10 Essential (primary) hypertension: Secondary | ICD-10-CM | POA: Diagnosis not present

## 2020-10-10 DIAGNOSIS — E782 Mixed hyperlipidemia: Secondary | ICD-10-CM | POA: Diagnosis not present

## 2020-10-10 DIAGNOSIS — E1140 Type 2 diabetes mellitus with diabetic neuropathy, unspecified: Secondary | ICD-10-CM | POA: Diagnosis not present

## 2020-10-10 DIAGNOSIS — J0110 Acute frontal sinusitis, unspecified: Secondary | ICD-10-CM

## 2020-10-10 DIAGNOSIS — Z0000 Encounter for general adult medical examination without abnormal findings: Secondary | ICD-10-CM

## 2020-10-10 DIAGNOSIS — E663 Overweight: Secondary | ICD-10-CM

## 2020-10-10 DIAGNOSIS — Z6827 Body mass index (BMI) 27.0-27.9, adult: Secondary | ICD-10-CM

## 2020-10-10 MED ORDER — AMOXICILLIN-POT CLAVULANATE 875-125 MG PO TABS: 1.0000 | ORAL_TABLET | Freq: Two times a day (BID) | ORAL | 0 refills | Status: DC

## 2020-10-10 NOTE — Assessment & Plan Note (Signed)
-  Will collect A1c, last A1c 7.1. -Encourage to continue weight loss efforts and reduction of simple carbohydrates and glucose. -Will continue to monitor.

## 2020-10-10 NOTE — Assessment & Plan Note (Signed)
-  Will repeat lipid panel and hepatic function. -Continue current medication regimen. -Will continue current medication regimen.

## 2020-10-10 NOTE — Patient Instructions (Signed)

## 2020-10-10 NOTE — Assessment & Plan Note (Signed)
-  Stable. -Continue current medication regimen. -Will collect CMP for medication monitoring. -Will continue to monitor. 

## 2020-10-10 NOTE — Progress Notes (Signed)
Established Patient Office Visit  Subjective:  Patient ID: Mary Wade, female    DOB: 02-21-1960  Age: 60 y.o. MRN: [id number]  CC:  Chief Complaint  Patient presents with   Hypertension   Hyperlipidemia    HPI [name] presents for follow up on diabetes mellitus, hypertension and hyperlipidemia. Patient reports continues to have nasal congestion with dark-brown mucus, and this morning woke up with sinus pressure on the right side and headache. Reports cough has improved. Patient was evaluated by urgent care 3 weeks ago and advised it was something viral and treated symptomatically.  Diabetes: Pt denies increased urination or thirst. Pt reports medication compliance. Does report some diarrhea with metformin but tolerable. A few hypoglycemic events which improve after eating. Denies syncopal events. Does not check glucose at home. Has worked on reducing carbohydrates and sweets.  HTN: Pt denies chest pain, dizziness or lower extremity swelling. Has intermittent brief palpitations (chronic). Taking medication as directed without side effects.   HLD: Pt taking medication as directed without issues. Tolerating atorvastatin and fish oil.  Weight: Patient reports has been working on weight loss by reducing calories and carbohydrates.  Past Medical History:  Diagnosis Date   Aortic insufficiency    a. 10/2013: mod AI by echo.   Bicuspid aortic valve    a. 10/24/13 showed EF 55-60%, no RWMA, bicuspid AV with mod AI, mildly dilated ascending aorta, PASP 42mHg.   Cerebral aneurysm    Chronic diarrhea    Fibromyalgia    Gallstones    GERD (gastroesophageal reflux disease)    Heart murmur    Hyperlipidemia    Hypertension    Pancreatitis    2 previous episodes, last one 2013   PVC's (premature ventricular contractions)    a. 10/2013 event monitor: NSR and PVCs.   Tobacco abuse     Past Surgical History:  Procedure Laterality Date   ABDOMINAL HYSTERECTOMY     APPENDECTOMY      CHOLECYSTECTOMY      Family History  Problem Relation Age of Onset   CAD Mother        Died at 746 several blockages but patient unknown when this started   Aneurysm Mother        Brain aneurysm - died of this during surgery   Hypertension Mother    Hyperlipidemia Mother    Thyroid disease Mother    Heart disease Father        Died of MI at age 59- enlarged heart   Heart attack Father    Early death Father    Diabetes Paternal Grandmother    Esophageal cancer Cousin    Ulcerative colitis Daughter    Diabetes Daughter    Diabetes Son     Social History   Socioeconomic History   Marital status: Single    Spouse name: Not on file   Number of children: 2   Years of education: Not on file   Highest education level: Not on file  Occupational History    Employer: [employer]   Comment: Dental office  Tobacco Use   Smoking status: Former    Packs/day: 1.00    Years: 42.00    Pack years: 42.00    Types: Cigarettes    Quit date: 01/23/2016    Years since quitting: 4.7   Smokeless tobacco: Never  Vaping Use   Vaping Use: Never used  Substance and Sexual Activity   Alcohol use: Yes  Alcohol/week: 0.0 standard drinks    Comment: twice per week - 2-6 beers on each occasion   Drug use: No   Sexual activity: Yes  Other Topics Concern   Not on file  Social History Narrative   Single mother   Daughter and son, about to adopt a foster child in upcoming 2017   Dental hygienist   Social Determinants of Health   Financial Resource Strain: Not on file  Food Insecurity: Not on file  Transportation Needs: Not on file  Physical Activity: Not on file  Stress: Not on file  Social Connections: Not on file  Intimate Partner Violence: Not on file    Outpatient Medications Prior to Visit  Medication Sig Dispense Refill   albuterol (VENTOLIN HFA) 108 (90 Base) MCG/ACT inhaler Inhale 2 puffs into the lungs every 6 (six) hours as needed for wheezing or shortness of breath. 8  g 0   ALPRAZolam (XANAX) 0.5 MG tablet Take 1 tablet (0.5 mg total) by mouth as needed. 30 tablet 0   atorvastatin (LIPITOR) 10 MG tablet TAKE 1 TABLET (10 MG TOTAL) BY MOUTH DAILY. 30 tablet 1   benzonatate (TESSALON) 100 MG capsule Take 1-2 capsules (100-200 mg total) by mouth 3 (three) times daily as needed. 60 capsule 0   bisoprolol-hydrochlorothiazide (ZIAC) 10-6.25 MG tablet TAKE 1 TABLET BY MOUTH DAILY. 30 tablet 1   blood glucose meter kit and supplies Dispense based on patient and insurance preference. Use up to four times daily as directed. (FOR ICD-10 E10.9, E11.9). 1 each 0   blood glucose meter kit and supplies Dispense based on patient and insurance preference. Use to check fasting blood sugar and 2 hrs after largest meal.(FOR ICD-10 E10.9, E11.9). 1 each 0   Cholecalciferol (VITAMIN D3) 5000 units CAPS Take 1 capsule (5,000 Units total) by mouth daily. 30 capsule    erythromycin ophthalmic ointment Place 1cm ribbon ointment into eyes TID for next 7 days 3.5 g 0   fluticasone (FLONASE) 50 MCG/ACT nasal spray Place 2 sprays into both nostrils daily. 16 g 6   gabapentin (NEURONTIN) 100 MG capsule TAKE 1 CAPSULE BY MOUTH 2 TIMES DAILY. 60 capsule 1   metFORMIN (GLUCOPHAGE) 500 MG tablet Take 0.5 tablets (250 mg total) by mouth 2 (two) times daily with a meal. 30 tablet 3   omega-3 acid ethyl esters (LOVAZA) 1 g capsule TAKE 1 CAPSULE BY MOUTH DAILY. 30 capsule 0   omeprazole (PRILOSEC) 20 MG capsule Take 2 capsules (40 mg total) by mouth daily. 30 capsule 11   oxyCODONE-acetaminophen (PERCOCET) 5-325 MG tablet Take 1 tablet by mouth every 6 (six) hours as needed for severe pain. 12 tablet 0   promethazine-dextromethorphan (PROMETHAZINE-DM) 6.25-15 MG/5ML syrup Take 5 mLs by mouth at bedtime as needed for cough. 100 mL 0   Vitamin D, Ergocalciferol, (DRISDOL) 1.25 MG (50000 UNIT) CAPS capsule TAKE 1 CAPSULE BY MOUTH EVERY 7 DAYS 4 capsule 1   famotidine (PEPCID) 40 MG tablet Take 1 tablet  (40 mg total) by mouth daily for 5 days. 5 tablet 0   predniSONE (DELTASONE) 20 MG tablet Take 2 tablets daily with breakfast. (Patient not taking: Reported on 10/10/2020) 10 tablet 0   No facility-administered medications prior to visit.    Allergies  Allergen Reactions   Crestor [Rosuvastatin]     Mood changes   Pravastatin     Muscle aches, elevated LFTs    ROS Review of Systems Review of Systems:  A fourteen system   review of systems was performed and found to be positive as per HPI.  Objective:    Physical Exam General:  Pleasant and cooperative, in no acute distress  Neuro:  Alert and oriented,  extra-ocular muscles intact  HEENT:  Normocephalic, atraumatic, right temporal/frontal sinus tenderness, normal TM's of both ears, boggy and pale turbinates, neck supple, +adenopathy (submandibular gland) Skin:  no gross rash, warm, pink. Cardiac:  RRR Respiratory:  CTA B/L, Not using accessory muscles, speaking in full sentences- unlabored. Vascular:  Ext warm, no cyanosis apprec.; cap RF less 2 sec. No edema  Psych:  No HI/SI, judgement and insight good, Euthymic mood. Full Affect.  BP 136/77   Pulse 80   Temp 97.6 F (36.4 C)   Ht 5' 4" (1.626 m)   Wt 162 lb 1.6 oz (73.5 kg)   SpO2 100%   BMI 27.82 kg/m  Wt Readings from Last 3 Encounters:  10/10/20 162 lb 1.6 oz (73.5 kg)  09/02/20 166 lb 11.2 oz (75.6 kg)  06/23/20 166 lb 9.6 oz (75.6 kg)     Health Maintenance Due  Topic Date Due   COVID-19 Vaccine (1) Never done   FOOT EXAM  Never done   OPHTHALMOLOGY EXAM  Never done   MAMMOGRAM  Never done   Zoster Vaccines- Shingrix (1 of 2) Never done   Pneumococcal Vaccine 0-64 Years old (2 - PCV) 10/18/2015   INFLUENZA VACCINE  09/15/2020    There are no preventive care reminders to display for this patient.  Lab Results  Component Value Date   TSH 1.100 10/26/2019   Lab Results  Component Value Date   WBC 10.2 06/21/2020   HGB 13.1 06/21/2020   HCT 38.4  06/21/2020   MCV 90.6 06/21/2020   PLT 289 06/21/2020   Lab Results  Component Value Date   NA 139 06/21/2020   K 3.9 06/21/2020   CO2 27 06/21/2020   GLUCOSE 126 (H) 06/21/2020   BUN 19 06/21/2020   CREATININE 0.57 06/21/2020   BILITOT 0.2 07/18/2020   ALKPHOS 74 07/18/2020   AST 22 07/18/2020   ALT 31 07/18/2020   PROT 6.5 07/18/2020   ALBUMIN 4.3 07/18/2020   CALCIUM 8.9 06/21/2020   ANIONGAP 8 06/21/2020   EGFR 112 06/06/2020   Lab Results  Component Value Date   CHOL 246 (H) 07/18/2020   Lab Results  Component Value Date   HDL 31 (L) 07/18/2020   Lab Results  Component Value Date   LDLCALC 102 (H) 07/18/2020   Lab Results  Component Value Date   TRIG 658 (HH) 07/18/2020   Lab Results  Component Value Date   CHOLHDL 7.9 (H) 07/18/2020   Lab Results  Component Value Date   HGBA1C 7.1 (H) 06/06/2020      Assessment & Plan:   Problem List Items Addressed This Visit       Cardiovascular and Mediastinum   Essential hypertension (Chronic)    -Stable. -Continue current medication regimen. Will collect CMP for medication monitoring. -Will continue to monitor.      Relevant Orders   CBC   Comprehensive metabolic panel   TSH   Lipid panel   Hemoglobin A1c     Endocrine   Type 2 diabetes mellitus with diabetic neuropathy, without long-term current use of insulin (HCC) - Primary    -Will collect A1c, last A1c 7.1. -Encourage to continue weight loss efforts and reduction of simple carbohydrates and glucose. -Will continue to monitor.        Relevant Orders   CBC   Comprehensive metabolic panel   TSH   Lipid panel   Hemoglobin A1c     Other   Mixed hyperlipidemia    -Will repeat lipid panel and hepatic function. -Continue current medication regimen. -Will continue current medication regimen.      Relevant Orders   CBC   Comprehensive metabolic panel   TSH   Lipid panel   Hemoglobin A1c   Other Visit Diagnoses     Healthcare  maintenance       Relevant Orders   CBC   Comprehensive metabolic panel   TSH   Lipid panel   Hemoglobin A1c   Subacute frontal sinusitis       Relevant Medications   amoxicillin-clavulanate (AUGMENTIN) 875-125 MG tablet   Overweight with body mass index (BMI) of 27 to 27.9 in adult          Subacute frontal sinusitis: -Patient tested negative for Covid-19 09/19/2020 and symptoms have been ongoing for >14 days so will start antibiotic therapy with Augmentin. Recommend to use nasal spray (Flonase) and warm/steamy showers. -Follow up if symptoms fail to improve or worsen.  Overweight with body mass index (BMI) of 27 to 27.9 in adult: -Associated with type 2 diabetes mellitus, hypertension and hyperlipidemia. -Encourage patient to continue weight loss efforts with dietary changes and calorie reduction.  Meds ordered this encounter  Medications   amoxicillin-clavulanate (AUGMENTIN) 875-125 MG tablet    Sig: Take 1 tablet by mouth 2 (two) times daily.    Dispense:  14 tablet    Refill:  0    Follow-up: Return in about 4 months (around 02/09/2021) for HTN, HLD.    Lorrene Reid, PA-C

## 2020-10-11 LAB — COMPREHENSIVE METABOLIC PANEL
ALT: 38 IU/L — ABNORMAL HIGH (ref 0–32)
AST: 29 IU/L (ref 0–40)
Albumin/Globulin Ratio: 2.5 — ABNORMAL HIGH (ref 1.2–2.2)
Albumin: 5.2 g/dL — ABNORMAL HIGH (ref 3.8–4.9)
Alkaline Phosphatase: 77 IU/L (ref 44–121)
BUN/Creatinine Ratio: 28 — ABNORMAL HIGH (ref 9–23)
BUN: 13 mg/dL (ref 6–24)
Bilirubin Total: 0.4 mg/dL (ref 0.0–1.2)
CO2: 23 mmol/L (ref 20–29)
Calcium: 9.6 mg/dL (ref 8.7–10.2)
Chloride: 98 mmol/L (ref 96–106)
Creatinine, Ser: 0.47 mg/dL — ABNORMAL LOW (ref 0.57–1.00)
Globulin, Total: 2.1 g/dL (ref 1.5–4.5)
Glucose: 121 mg/dL — ABNORMAL HIGH (ref 65–99)
Potassium: 4.3 mmol/L (ref 3.5–5.2)
Sodium: 139 mmol/L (ref 134–144)
Total Protein: 7.3 g/dL (ref 6.0–8.5)
eGFR: 110 mL/min/{1.73_m2} (ref >59)

## 2020-10-11 LAB — CBC
Hematocrit: 44.1 % (ref 34.0–46.6)
Hemoglobin: 14.8 g/dL (ref 11.1–15.9)
MCH: 31.1 pg (ref 26.6–33.0)
MCHC: 33.6 g/dL (ref 31.5–35.7)
MCV: 93 fL (ref 79–97)
Platelets: 298 10*3/uL (ref 150–450)
RBC: 4.76 x10E6/uL (ref 3.77–5.28)
RDW: 13 % (ref 11.7–15.4)
WBC: 9.5 10*3/uL (ref 3.4–10.8)

## 2020-10-11 LAB — LIPID PANEL
Chol/HDL Ratio: 5.3 ratio — ABNORMAL HIGH (ref 0.0–4.4)
Cholesterol, Total: 197 mg/dL (ref 100–199)
HDL: 37 mg/dL — ABNORMAL LOW (ref >39)
LDL Chol Calc (NIH): 90 mg/dL (ref 0–99)
Triglycerides: 429 mg/dL — ABNORMAL HIGH (ref 0–149)
VLDL Cholesterol Cal: 70 mg/dL — ABNORMAL HIGH (ref 5–40)

## 2020-10-11 LAB — TSH: TSH: 0.803 u[IU]/mL (ref 0.450–4.500)

## 2020-10-11 LAB — HEMOGLOBIN A1C
Est. average glucose Bld gHb Est-mCnc: 146 mg/dL
Hgb A1c MFr Bld: 6.7 % — ABNORMAL HIGH (ref 4.8–5.6)

## 2020-10-16 ENCOUNTER — Encounter: Payer: Self-pay | Admitting: Physician Assistant

## 2020-10-21 ENCOUNTER — Other Ambulatory Visit: Payer: Self-pay

## 2020-10-21 DIAGNOSIS — W278XXA Contact with other nonpowered hand tool, initial encounter: Secondary | ICD-10-CM

## 2020-10-24 ENCOUNTER — Other Ambulatory Visit: Payer: 59

## 2020-10-31 ENCOUNTER — Other Ambulatory Visit: Payer: 59

## 2020-10-31 ENCOUNTER — Ambulatory Visit: Payer: 59

## 2020-11-04 ENCOUNTER — Other Ambulatory Visit: Payer: Self-pay | Admitting: Physician Assistant

## 2020-11-04 ENCOUNTER — Other Ambulatory Visit: Payer: Self-pay | Admitting: Nurse Practitioner

## 2020-11-04 DIAGNOSIS — E782 Mixed hyperlipidemia: Secondary | ICD-10-CM

## 2020-11-04 DIAGNOSIS — Z0000 Encounter for general adult medical examination without abnormal findings: Secondary | ICD-10-CM

## 2020-11-04 DIAGNOSIS — E1140 Type 2 diabetes mellitus with diabetic neuropathy, unspecified: Secondary | ICD-10-CM

## 2020-11-04 DIAGNOSIS — I10 Essential (primary) hypertension: Secondary | ICD-10-CM

## 2020-12-13 ENCOUNTER — Encounter: Payer: Self-pay | Admitting: *Deleted

## 2020-12-13 ENCOUNTER — Encounter: Payer: Self-pay | Admitting: Physician Assistant

## 2020-12-13 ENCOUNTER — Other Ambulatory Visit: Payer: Self-pay | Admitting: Physician Assistant

## 2020-12-13 ENCOUNTER — Ambulatory Visit
Admission: EM | Admit: 2020-12-13 | Discharge: 2020-12-13 | Disposition: A | Discharge status: Home / Self Care | Payer: 59 | Attending: Physician Assistant | Admitting: Physician Assistant

## 2020-12-13 DIAGNOSIS — I10 Essential (primary) hypertension: Secondary | ICD-10-CM

## 2020-12-13 DIAGNOSIS — N390 Urinary tract infection, site not specified: Secondary | ICD-10-CM | POA: Insufficient documentation

## 2020-12-13 DIAGNOSIS — R319 Hematuria, unspecified: Secondary | ICD-10-CM

## 2020-12-13 DIAGNOSIS — E559 Vitamin D deficiency, unspecified: Secondary | ICD-10-CM

## 2020-12-13 LAB — POCT URINALYSIS DIP (MANUAL ENTRY)
Bilirubin, UA: NEGATIVE
Glucose, UA: NEGATIVE mg/dL
Ketones, POC UA: NEGATIVE mg/dL
Nitrite, UA: NEGATIVE
Protein Ur, POC: 30 mg/dL — AB
Spec Grav, UA: 1.01 (ref 1.010–1.025)
Urobilinogen, UA: 0.2 E.U./dL
pH, UA: 6 (ref 5.0–8.0)

## 2020-12-13 MED ORDER — NITROFURANTOIN MONOHYD MACRO 100 MG PO CAPS: 100.0000 mg | ORAL_CAPSULE | Freq: Two times a day (BID) | ORAL | 0 refills | Status: DC

## 2020-12-13 NOTE — ED Triage Notes (Signed)
C/O dysuria, bladder discomfort, polyuria, low back pain onset this AM. Denies fevers.

## 2020-12-13 NOTE — ED Provider Notes (Signed)
[name]    CSN: [id number] Arrival date & time: 12/13/20  1246      History   Chief Complaint Chief Complaint  Patient presents with   Dysuria    HPI Mary Wade is a 60 y.o. female.   Here today for evaluation of dysuria, low back pain, and urinary frequency that started this morning.  She has not had any nausea or vomiting.  She denies any fever or chills.  She has not reported any treatment for symptoms.  The history is provided by the patient.  Dysuria Associated symptoms: no abdominal pain, no fever, no nausea and no vomiting    Past Medical History:  Diagnosis Date   Aortic insufficiency    a. 10/2013: mod AI by echo.   Bicuspid aortic valve    a. 10/24/13 showed EF 55-60%, no RWMA, bicuspid AV with mod AI, mildly dilated ascending aorta, PASP 73mHg.   Cerebral aneurysm    Chronic diarrhea    Fibromyalgia    Gallstones    GERD (gastroesophageal reflux disease)    Heart murmur    Hyperlipidemia    Hypertension    Pancreatitis    2 previous episodes, last one 2013   PVC's (premature ventricular contractions)    a. 10/2013 event monitor: NSR and PVCs.   Tobacco abuse     Patient Active Problem List   Diagnosis Date Noted   Conjunctivitis 09/17/2020   Type 2 diabetes mellitus with diabetic neuropathy, without long-term current use of insulin (HBonnie 06/24/2020   Lymph node enlargement 06/24/2020   Allergic sinusitis 03/12/2019   Sleep difficulties 03/12/2019   Personal history of noncompliance with medical treatment, presenting hazards to health 03/12/2019   Smoker unmotivated to quit 03/12/2019   Otitis media 10/10/2018   Rhinosinusitis 10/10/2018   Vitamin D insufficiency 08/11/2018   Dysuria 10/18/2017   Flank pain 10/18/2017   Abnormal urinalysis 10/18/2017   Loose stools 10/18/2017   Nasal drainage 05/18/2017   Acute frontal sinusitis 05/18/2017   Vitamin B12 nutritional deficiency 07/02/2016   Hypertriglyceridemia 12/26/2015    Vitamin D deficiency 12/26/2015   Low HDL (under 40) 12/26/2015   History of alcohol use disorder 12/26/2015   h/o PVC's (premature ventricular contractions) 11/01/2015   dx in past w/ Fibromyalgia 11/01/2015   Aortic insufficiency 11/01/2015   Anxiety state 11/01/2015   Emotional crisis as acute reaction to exceptional (gross) stress 11/01/2015   GERD (gastroesophageal reflux disease) 10/31/2015   Gallstone Pancreatitis 10/31/2015   Diarrhea- 2nd to GB removal 10/31/2015   Tobacco abuse counseling 10/31/2015   Neuropathy 05/23/2015   Arthritis of hand 05/23/2015   Cerebral aneurysm 01/24/2015   Right carotid bruit 10/17/2014   h/o Chest pains 10/17/2014   Palpitations 09/28/2013   Bicuspid aortic valve 09/28/2013   Mixed hyperlipidemia 09/28/2013   Tobacco abuse 09/28/2013   Essential hypertension 09/28/2013    Past Surgical History:  Procedure Laterality Date   ABDOMINAL HYSTERECTOMY     APPENDECTOMY     CHOLECYSTECTOMY      OB History   No obstetric history on file.      Home Medications    Prior to Admission medications   Medication Sig Start Date End Date Taking? Authorizing Provider  atorvastatin (LIPITOR) 10 MG tablet TAKE 1 TABLET (10 MG TOTAL) BY MOUTH DAILY. 11/04/20  Yes Abonza, Maritza, PA-C  bisoprolol-hydrochlorothiazide (ZIAC) 10-6.25 MG tablet TAKE 1 TABLET BY MOUTH DAILY. 10/06/20  Yes ALorrene Reid PA-C  Cholecalciferol (VITAMIN D3)  5000 units CAPS Take 1 capsule (5,000 Units total) by mouth daily. 10/08/16  Yes Opalski, Deborah, DO  gabapentin (NEURONTIN) 100 MG capsule TAKE 1 CAPSULE BY MOUTH 2 TIMES DAILY. 10/06/20  Yes Abonza, Maritza, PA-C  metFORMIN (GLUCOPHAGE) 500 MG tablet TAKE 1/2 TABLET BY MOUTH 2 TIMES DAILY WITH A MEAL. 11/04/20  Yes Boscia, Greer Ee, NP  nitrofurantoin, macrocrystal-monohydrate, (MACROBID) 100 MG capsule Take 1 capsule (100 mg total) by mouth 2 (two) times daily. 12/13/20  Yes Ewell Poe F, PA-C  omega-3 acid ethyl  esters (LOVAZA) 1 g capsule TAKE 1 CAPSULE BY MOUTH DAILY. 11/04/20  Yes Abonza, Maritza, PA-C  omeprazole (PRILOSEC) 20 MG capsule Take 2 capsules (40 mg total) by mouth daily. 11/07/14  Yes Barrett, Evelene Croon, PA-C  UNABLE TO FIND Med Name: "55m nicotine pouch"   Yes [provider]  Vitamin D, Ergocalciferol, (DRISDOL) 1.25 MG (50000 UNIT) CAPS capsule TAKE 1 CAPSULE BY MOUTH EVERY 7 DAYS 10/06/20  Yes Abonza, Maritza, PA-C  albuterol (VENTOLIN HFA) 108 (90 Base) MCG/ACT inhaler Inhale 2 puffs into the lungs every 6 (six) hours as needed for wheezing or shortness of breath. 04/10/20   ALorrene Reid PA-C  ALPRAZolam (XANAX) 0.5 MG tablet Take 1 tablet (0.5 mg total) by mouth as needed. 02/06/20   ALorrene Reid PA-C  amoxicillin-clavulanate (AUGMENTIN) 875-125 MG tablet Take 1 tablet by mouth 2 (two) times daily. 10/10/20   ALorrene Reid PA-C  benzonatate (TESSALON) 100 MG capsule Take 1-2 capsules (100-200 mg total) by mouth 3 (three) times daily as needed. 09/19/20   MJaynee Eagles PA-C  blood glucose meter kit and supplies Dispense based on patient and insurance preference. Use up to four times daily as directed. (FOR ICD-10 E10.9, E11.9). 06/23/20   BRonnell Freshwater NP  blood glucose meter kit and supplies Dispense based on patient and insurance preference. Use to check fasting blood sugar and 2 hrs after largest meal.(FOR ICD-10 E10.9, E11.9). 06/24/20   BRonnell Freshwater NP  erythromycin ophthalmic ointment Place 1cm ribbon ointment into eyes TID for next 7 days 09/02/20   BRonnell Freshwater NP  famotidine (PEPCID) 40 MG tablet Take 1 tablet (40 mg total) by mouth daily for 5 days. 04/08/20 04/13/20  HScot Jun FNP  fluticasone (FLONASE) 50 MCG/ACT nasal spray Place 2 sprays into both nostrils daily. 03/18/20   ALorrene Reid PA-C  oxyCODONE-acetaminophen (PERCOCET) 5-325 MG tablet Take 1 tablet by mouth every 6 (six) hours as needed for severe pain. 06/22/20   Molpus, John, MD   promethazine-dextromethorphan (PROMETHAZINE-DM) 6.25-15 MG/5ML syrup Take 5 mLs by mouth at bedtime as needed for cough. 09/19/20   MJaynee Eagles PA-C    Family History Family History  Problem Relation Age of Onset   CAD Mother        Died at 737 several blockages but patient unknown when this started   Aneurysm Mother        Brain aneurysm - died of this during surgery   Hypertension Mother    Hyperlipidemia Mother    Thyroid disease Mother    Heart disease Father        Died of MI at age 59- enlarged heart   Heart attack Father    Early death Father    Diabetes Paternal Grandmother    Esophageal cancer Cousin    Ulcerative colitis Daughter    Diabetes Daughter    Diabetes Son     Social History Social History  Tobacco Use   Smoking status: Former    Packs/day: 1.00    Years: 42.00    Pack years: 42.00    Types: Cigarettes    Quit date: 01/23/2016    Years since quitting: 4.8   Smokeless tobacco: Never  Vaping Use   Vaping Use: Never used  Substance Use Topics   Alcohol use: Yes    Comment: occasionally   Drug use: No     Allergies   Crestor [rosuvastatin] and Pravastatin   Review of Systems Review of Systems  Constitutional:  Negative for chills and fever.  Respiratory:  Negative for shortness of breath.   Gastrointestinal:  Negative for abdominal pain, nausea and vomiting.  Genitourinary:  Positive for dysuria and frequency.  Musculoskeletal:  Positive for back pain.    Physical Exam Triage Vital Signs ED Triage Vitals  Enc Vitals Group     BP 12/13/20 1312 (!) 146/79     Pulse Rate 12/13/20 1312 77     Resp 12/13/20 1312 20     Temp 12/13/20 1312 98 F (36.7 C)     Temp Source 12/13/20 1312 Temporal     SpO2 12/13/20 1312 98 %     Weight --      Height --      Head Circumference --      Peak Flow --      Pain Score 12/13/20 1313 8     Pain Loc --      Pain Edu? --      Excl. in [place]? --    No data found.  Updated Vital Signs BP (!)  146/79   Pulse 77   Temp 98 F (36.7 C) (Temporal)   Resp 20   SpO2 98%      Physical Exam Vitals and nursing note reviewed.  Constitutional:      General: She is not in acute distress.    Appearance: Normal appearance. She is not ill-appearing.  HENT:     Head: Normocephalic and atraumatic.     Nose: Nose normal.  Cardiovascular:     Rate and Rhythm: Normal rate and regular rhythm.     Heart sounds: Normal heart sounds. No murmur heard. Pulmonary:     Effort: Pulmonary effort is normal. No respiratory distress.     Breath sounds: Normal breath sounds. No wheezing, rhonchi or rales.  Abdominal:     General: Abdomen is flat.     Tenderness: There is no right CVA tenderness or left CVA tenderness.  Skin:    General: Skin is warm and dry.  Neurological:     Mental Status: She is alert.  Psychiatric:        Mood and Affect: Mood normal.        Thought Content: Thought content normal.     UC Treatments / Results  Labs (all labs ordered are listed, but only abnormal results are displayed) Labs Reviewed  POCT URINALYSIS DIP (MANUAL ENTRY) - Abnormal; Notable for the following components:      Result Value   Clarity, UA hazy (*)    Blood, UA large (*)    Protein Ur, POC =30 (*)    Leukocytes, UA Moderate (2+) (*)    All other components within normal limits  URINE CULTURE    EKG   Radiology No results found.  Procedures Procedures (including critical care time)  Medications Ordered in UC Medications - No data to display  Initial Impression / Assessment and Plan /  UC Course  I have reviewed the triage vital signs and the nursing notes.  Pertinent labs & imaging results that were available during my care of the patient were reviewed by me and considered in my medical decision making (see chart for details).  Macrobid prescribed for suspected UTI.  Will order urine culture.  Encouraged follow-up if symptoms fail to improve or worsen.  Final Clinical  Impressions(s) / UC Diagnoses   Final diagnoses:  Urinary tract infection with hematuria, site unspecified   Discharge Instructions   None    ED Prescriptions     Medication Sig Dispense Auth. Provider   nitrofurantoin, macrocrystal-monohydrate, (MACROBID) 100 MG capsule Take 1 capsule (100 mg total) by mouth 2 (two) times daily. 10 capsule Francene Finders, PA-C      PDMP not reviewed this encounter.   Francene Finders, PA-C 12/13/20 1422

## 2020-12-14 LAB — URINE CULTURE: Culture: <10000 — AB

## 2020-12-24 ENCOUNTER — Encounter (HOSPITAL_COMMUNITY): Payer: Self-pay | Admitting: Cardiology

## 2021-01-01 ENCOUNTER — Telehealth (HOSPITAL_COMMUNITY): Payer: Self-pay | Admitting: Cardiology

## 2021-01-01 NOTE — Telephone Encounter (Signed)
Just an FYI. We have made several attempts to contact this patient including sending a letter to schedule or reschedule their echocardiogram. We will be removing the patient from the echo WQ. 12/24/20 MAILED LETTER  12/24/20 [facility] to schedule @ 10:18/LBW  12/22/20 [facility] to schedule @ 11:35/BW  11/2/22called and NVM set up to schedule in Dec @ 9:30am/LBW    Thank you

## 2021-01-16 ENCOUNTER — Other Ambulatory Visit: Payer: Self-pay | Admitting: Physician Assistant

## 2021-01-16 DIAGNOSIS — I10 Essential (primary) hypertension: Secondary | ICD-10-CM

## 2021-01-16 DIAGNOSIS — Z0000 Encounter for general adult medical examination without abnormal findings: Secondary | ICD-10-CM

## 2021-01-16 DIAGNOSIS — E782 Mixed hyperlipidemia: Secondary | ICD-10-CM

## 2021-01-16 NOTE — Progress Notes (Deleted)
HPI: FU bicuspid aortic valve.  ABIs June 2015 normal.  Monitor September 2015 showed sinus with PVCs.  Exercise treadmill September 2016 negative.  MRA October 2021 showed slight vascular prominence of the right M2 region which was unchanged compared to 2015.  Echocardiogram December 2021 showed normal LV function, mildly dilated aortic root at 40 mm, probable bicuspid aortic valve with moderate aortic insufficiency.  CTA February 2022 showed no evidence of thoracic aortic aneurysm.  There was note of coronary artery calcification and right-sided pulmonary nodules with follow-up recommended in 12 months.  Carotid Dopplers May 2022 showed less than 50% bilateral.  Since last seen,   Current Outpatient Medications  Medication Sig Dispense Refill   albuterol (VENTOLIN HFA) 108 (90 Base) MCG/ACT inhaler Inhale 2 puffs into the lungs every 6 (six) hours as needed for wheezing or shortness of breath. 8 g 0   ALPRAZolam (XANAX) 0.5 MG tablet Take 1 tablet (0.5 mg total) by mouth as needed. 30 tablet 0   amoxicillin-clavulanate (AUGMENTIN) 875-125 MG tablet Take 1 tablet by mouth 2 (two) times daily. 14 tablet 0   atorvastatin (LIPITOR) 10 MG tablet TAKE 1 TABLET (10 MG TOTAL) BY MOUTH DAILY. 30 tablet 1   benzonatate (TESSALON) 100 MG capsule Take 1-2 capsules (100-200 mg total) by mouth 3 (three) times daily as needed. 60 capsule 0   bisoprolol-hydrochlorothiazide (ZIAC) 10-6.25 MG tablet TAKE 1 TABLET BY MOUTH DAILY. 30 tablet 1   blood glucose meter kit and supplies Dispense based on patient and insurance preference. Use up to four times daily as directed. (FOR ICD-10 E10.9, E11.9). 1 each 0   blood glucose meter kit and supplies Dispense based on patient and insurance preference. Use to check fasting blood sugar and 2 hrs after largest meal.(FOR ICD-10 E10.9, E11.9). 1 each 0   Cholecalciferol (VITAMIN D3) 5000 units CAPS Take 1 capsule (5,000 Units total) by mouth daily. 30 capsule     erythromycin ophthalmic ointment Place 1cm ribbon ointment into eyes TID for next 7 days 3.5 g 0   famotidine (PEPCID) 40 MG tablet Take 1 tablet (40 mg total) by mouth daily for 5 days. 5 tablet 0   fluticasone (FLONASE) 50 MCG/ACT nasal spray Place 2 sprays into both nostrils daily. 16 g 6   gabapentin (NEURONTIN) 100 MG capsule TAKE 1 CAPSULE BY MOUTH 2 TIMES DAILY. 60 capsule 1   metFORMIN (GLUCOPHAGE) 500 MG tablet TAKE 1/2 TABLET BY MOUTH 2 TIMES DAILY WITH A MEAL. 30 tablet 3   nitrofurantoin, macrocrystal-monohydrate, (MACROBID) 100 MG capsule Take 1 capsule (100 mg total) by mouth 2 (two) times daily. 10 capsule 0   omega-3 acid ethyl esters (LOVAZA) 1 g capsule TAKE 1 CAPSULE BY MOUTH DAILY. 30 capsule 2   omeprazole (PRILOSEC) 20 MG capsule Take 2 capsules (40 mg total) by mouth daily. 30 capsule 11   oxyCODONE-acetaminophen (PERCOCET) 5-325 MG tablet Take 1 tablet by mouth every 6 (six) hours as needed for severe pain. 12 tablet 0   promethazine-dextromethorphan (PROMETHAZINE-DM) 6.25-15 MG/5ML syrup Take 5 mLs by mouth at bedtime as needed for cough. 100 mL 0   UNABLE TO FIND Med Name: "44m nicotine pouch"     Vitamin D, Ergocalciferol, (DRISDOL) 1.25 MG (50000 UNIT) CAPS capsule TAKE 1 CAPSULE BY MOUTH EVERY 7 DAYS 4 capsule 1   No current facility-administered medications for this visit.     Past Medical History:  Diagnosis Date   Aortic insufficiency  a. 10/2013: mod AI by echo.   Bicuspid aortic valve    a. 10/24/13 showed EF 55-60%, no RWMA, bicuspid AV with mod AI, mildly dilated ascending aorta, PASP 46mHg.   Cerebral aneurysm    Chronic diarrhea    Fibromyalgia    Gallstones    GERD (gastroesophageal reflux disease)    Heart murmur    Hyperlipidemia    Hypertension    Pancreatitis    2 previous episodes, last one 2013   PVC's (premature ventricular contractions)    a. 10/2013 event monitor: NSR and PVCs.   Tobacco abuse     Past Surgical History:  Procedure  Laterality Date   ABDOMINAL HYSTERECTOMY     APPENDECTOMY     CHOLECYSTECTOMY      Social History   Socioeconomic History   Marital status: Single    Spouse name: Not on file   Number of children: 2   Years of education: Not on file   Highest education level: Not on file  Occupational History    Employer: [employer]   Comment: Dental office  Tobacco Use   Smoking status: Former    Packs/day: 1.00    Years: 42.00    Pack years: 42.00    Types: Cigarettes    Quit date: 01/23/2016    Years since quitting: 4.9   Smokeless tobacco: Never  Vaping Use   Vaping Use: Never used  Substance and Sexual Activity   Alcohol use: Yes    Comment: occasionally   Drug use: No   Sexual activity: Not on file  Other Topics Concern   Not on file  Social History Narrative   Single mother   Daughter and son, about to adopt a foster child in upcoming 2017   Dental hygienist   Social Determinants of Health   Financial Resource Strain: Not on file  Food Insecurity: Not on file  Transportation Needs: Not on file  Physical Activity: Not on file  Stress: Not on file  Social Connections: Not on file  Intimate Partner Violence: Not on file    Family History  Problem Relation Age of Onset   CAD Mother        Died at 739 several blockages but patient unknown when this started   Aneurysm Mother        Brain aneurysm - died of this during surgery   Hypertension Mother    Hyperlipidemia Mother    Thyroid disease Mother    Heart disease Father        Died of MI at age 59- enlarged heart   Heart attack Father    Early death Father    Diabetes Paternal Grandmother    Esophageal cancer Cousin    Ulcerative colitis Daughter    Diabetes Daughter    Diabetes Son     ROS: no fevers or chills, productive cough, hemoptysis, dysphasia, odynophagia, melena, hematochezia, dysuria, hematuria, rash, seizure activity, orthopnea, PND, pedal edema, claudication. Remaining systems are  negative.  Physical Exam: Well-developed well-nourished in no acute distress.  Skin is warm and dry.  HEENT is normal.  Neck is supple.  Chest is clear to auscultation with normal expansion.  Cardiovascular exam is regular rate and rhythm.  Abdominal exam nontender or distended. No masses palpated. Extremities show no edema. neuro grossly intact  ECG- personally reviewed  A/P  1 bicuspid aortic valve-we will arrange follow-up echocardiogram to reassess aortic insufficiency.  2 pulmonary nodule-plan follow-up noncontrast chest CT February  2023.  3 tobacco abuse-  4 question history of right MCA ectasia versus aneurysm-no change on last MRA.  5 hypertension-blood pressure controlled.  Continue present medications.  6 history of hyperlipidemia-she has not tolerated statins previously.  Most recent lipid panel August 2022 showed total cholesterol 197, triglycerides 429, LDL 90.  Will refer to lipid clinic.  Kirk Ruths, MD

## 2021-01-23 ENCOUNTER — Ambulatory Visit: Payer: 59 | Admitting: Cardiology

## 2021-02-21 ENCOUNTER — Other Ambulatory Visit: Payer: Self-pay | Admitting: Physician Assistant

## 2021-02-21 DIAGNOSIS — E559 Vitamin D deficiency, unspecified: Secondary | ICD-10-CM

## 2021-02-21 DIAGNOSIS — F410 Panic disorder [episodic paroxysmal anxiety] without agoraphobia: Secondary | ICD-10-CM

## 2021-02-21 DIAGNOSIS — I10 Essential (primary) hypertension: Secondary | ICD-10-CM

## 2021-02-21 DIAGNOSIS — F430 Acute stress reaction: Secondary | ICD-10-CM

## 2021-02-21 DIAGNOSIS — G629 Polyneuropathy, unspecified: Secondary | ICD-10-CM

## 2021-02-23 ENCOUNTER — Ambulatory Visit: Payer: Self-pay | Admitting: Physician Assistant

## 2021-02-23 NOTE — Progress Notes (Deleted)
Established Patient Office Visit  Subjective:  Patient ID: Mary Wade, female    DOB: 1960-07-01  Age: 61 y.o. MRN: [id number]  CC: No chief complaint on file.   HPI Mary Wade presents for follow up on T2DM, hypertension and hyperlipidemia.  Diabetes: Pt denies increased urination or thirst. Pt reports medication compliance. No hypoglycemic events. Checking glucose at home. FBS range:  HTN: Pt denies chest pain, palpitations, dizziness, shortness of breath or lower extremity swelling. Taking medication as directed without side effects. Checks BP at home ***times/wk and readings range in ***. Pt follows a low salt diet.  HLD: Pt taking medication as directed without issues. Denies side effects including myalgias, muscle weakness or abdominal pain.  Gabapentin use  Due for FBW  Past Medical History:  Diagnosis Date   Aortic insufficiency    a. 10/2013: mod AI by echo.   Bicuspid aortic valve    a. 10/24/13 showed EF 55-60%, no RWMA, bicuspid AV with mod AI, mildly dilated ascending aorta, PASP 48mmHg.   Cerebral aneurysm    Chronic diarrhea    Fibromyalgia    Gallstones    GERD (gastroesophageal reflux disease)    Heart murmur    Hyperlipidemia    Hypertension    Pancreatitis    2 previous episodes, last one 2013   PVC's (premature ventricular contractions)    a. 10/2013 event monitor: NSR and PVCs.   Tobacco abuse     Past Surgical History:  Procedure Laterality Date   ABDOMINAL HYSTERECTOMY     APPENDECTOMY     CHOLECYSTECTOMY      Family History  Problem Relation Age of Onset   CAD Mother        Died at 32, several blockages but patient unknown when this started   Aneurysm Mother        Brain aneurysm - died of this during surgery   Hypertension Mother    Hyperlipidemia Mother    Thyroid disease Mother    Heart disease Father        Died of MI at age 66 - enlarged heart   Heart attack Father    Early death Father    Diabetes Paternal Grandmother     Esophageal cancer Cousin    Ulcerative colitis Daughter    Diabetes Daughter    Diabetes Son     Social History   Socioeconomic History   Marital status: Single    Spouse name: Not on file   Number of children: 2   Years of education: Not on file   Highest education level: Not on file  Occupational History    Employer: [employer]    Comment: Dental office  Tobacco Use   Smoking status: Former    Packs/day: 1.00    Years: 42.00    Pack years: 42.00    Types: Cigarettes    Quit date: 01/23/2016    Years since quitting: 5.0   Smokeless tobacco: Never  Vaping Use   Vaping Use: Never used  Substance and Sexual Activity   Alcohol use: Yes    Comment: occasionally   Drug use: No   Sexual activity: Not on file  Other Topics Concern   Not on file  Social History Narrative   Single mother   Daughter and son, about to adopt a foster child in upcoming 2017   Dental hygienist   Social Determinants of Health   Financial Resource Strain: Not on file  Food Insecurity: Not  on file  Transportation Needs: Not on file  Physical Activity: Not on file  Stress: Not on file  Social Connections: Not on file  Intimate Partner Violence: Not on file    Outpatient Medications Prior to Visit  Medication Sig Dispense Refill   albuterol (VENTOLIN HFA) 108 (90 Base) MCG/ACT inhaler Inhale 2 puffs into the lungs every 6 (six) hours as needed for wheezing or shortness of breath. 8 g 0   ALPRAZolam (XANAX) 0.5 MG tablet Take 1 tablet (0.5 mg total) by mouth as needed. 30 tablet 0   amoxicillin-clavulanate (AUGMENTIN) 875-125 MG tablet Take 1 tablet by mouth 2 (two) times daily. 14 tablet 0   atorvastatin (LIPITOR) 10 MG tablet TAKE 1 TABLET (10 MG TOTAL) BY MOUTH DAILY. 30 tablet 1   benzonatate (TESSALON) 100 MG capsule Take 1-2 capsules (100-200 mg total) by mouth 3 (three) times daily as needed. 60 capsule 0   bisoprolol-hydrochlorothiazide (ZIAC) 10-6.25 MG tablet TAKE 1 TABLET BY MOUTH  DAILY. 30 tablet 1   blood glucose meter kit and supplies Dispense based on patient and insurance preference. Use up to four times daily as directed. (FOR ICD-10 E10.9, E11.9). 1 each 0   blood glucose meter kit and supplies Dispense based on patient and insurance preference. Use to check fasting blood sugar and 2 hrs after largest meal.(FOR ICD-10 E10.9, E11.9). 1 each 0   Cholecalciferol (VITAMIN D3) 5000 units CAPS Take 1 capsule (5,000 Units total) by mouth daily. 30 capsule    erythromycin ophthalmic ointment Place 1cm ribbon ointment into eyes TID for next 7 days 3.5 g 0   famotidine (PEPCID) 40 MG tablet Take 1 tablet (40 mg total) by mouth daily for 5 days. 5 tablet 0   fluticasone (FLONASE) 50 MCG/ACT nasal spray Place 2 sprays into both nostrils daily. 16 g 6   gabapentin (NEURONTIN) 100 MG capsule TAKE 1 CAPSULE BY MOUTH 2 TIMES DAILY. 60 capsule 1   metFORMIN (GLUCOPHAGE) 500 MG tablet TAKE 1/2 TABLET BY MOUTH 2 TIMES DAILY WITH A MEAL. 30 tablet 3   nitrofurantoin, macrocrystal-monohydrate, (MACROBID) 100 MG capsule Take 1 capsule (100 mg total) by mouth 2 (two) times daily. 10 capsule 0   omega-3 acid ethyl esters (LOVAZA) 1 g capsule TAKE 1 CAPSULE BY MOUTH DAILY. 30 capsule 2   omeprazole (PRILOSEC) 20 MG capsule Take 2 capsules (40 mg total) by mouth daily. 30 capsule 11   oxyCODONE-acetaminophen (PERCOCET) 5-325 MG tablet Take 1 tablet by mouth every 6 (six) hours as needed for severe pain. 12 tablet 0   promethazine-dextromethorphan (PROMETHAZINE-DM) 6.25-15 MG/5ML syrup Take 5 mLs by mouth at bedtime as needed for cough. 100 mL 0   UNABLE TO FIND Med Name: "$RemoveBe'3mg'IzlzUSlnT$  nicotine pouch"     Vitamin D, Ergocalciferol, (DRISDOL) 1.25 MG (50000 UNIT) CAPS capsule TAKE 1 CAPSULE BY MOUTH EVERY 7 DAYS 4 capsule 1   No facility-administered medications prior to visit.    Allergies  Allergen Reactions   Crestor [Rosuvastatin]     Mood changes   Pravastatin     Muscle aches, elevated LFTs     ROS Review of Systems Review of Systems:  A fourteen system review of systems was performed and found to be positive as per HPI.   Objective:    Physical Exam General:  Well Developed, well nourished, appropriate for stated age.  Neuro:  Alert and oriented,  extra-ocular muscles intact  HEENT:  Normocephalic, atraumatic, neck supple Skin:  no gross  rash, warm, pink. Cardiac:  RRR, S1 S2 Respiratory: CTA B/L Vascular:  Ext warm, no cyanosis apprec.; cap RF less 2 sec. Psych:  No HI/SI, judgement and insight good, Euthymic mood. Full Affect.  There were no vitals taken for this visit. Wt Readings from Last 3 Encounters:  10/10/20 162 lb 1.6 oz (73.5 kg)  09/02/20 166 lb 11.2 oz (75.6 kg)  06/23/20 166 lb 9.6 oz (75.6 kg)     Health Maintenance Due  Topic Date Due   COVID-19 Vaccine (1) Never done   FOOT EXAM  Never done   OPHTHALMOLOGY EXAM  Never done   MAMMOGRAM  Never done   Zoster Vaccines- Shingrix (1 of 2) Never done   Pneumococcal Vaccine 20-26 Years old (2 - PCV) 10/18/2015   INFLUENZA VACCINE  09/15/2020    There are no preventive care reminders to display for this patient.  Lab Results  Component Value Date   TSH 0.803 10/10/2020   Lab Results  Component Value Date   WBC 9.5 10/10/2020   HGB 14.8 10/10/2020   HCT 44.1 10/10/2020   MCV 93 10/10/2020   PLT 298 10/10/2020   Lab Results  Component Value Date   NA 139 10/10/2020   K 4.3 10/10/2020   CO2 23 10/10/2020   GLUCOSE 121 (H) 10/10/2020   BUN 13 10/10/2020   CREATININE 0.47 (L) 10/10/2020   BILITOT 0.4 10/10/2020   ALKPHOS 77 10/10/2020   AST 29 10/10/2020   ALT 38 (H) 10/10/2020   PROT 7.3 10/10/2020   ALBUMIN 5.2 (H) 10/10/2020   CALCIUM 9.6 10/10/2020   ANIONGAP 8 06/21/2020   EGFR 110 10/10/2020   Lab Results  Component Value Date   CHOL 197 10/10/2020   Lab Results  Component Value Date   HDL 37 (L) 10/10/2020   Lab Results  Component Value Date   LDLCALC 90  10/10/2020   Lab Results  Component Value Date   TRIG 429 (H) 10/10/2020   Lab Results  Component Value Date   CHOLHDL 5.3 (H) 10/10/2020   Lab Results  Component Value Date   HGBA1C 6.7 (H) 10/10/2020      Assessment & Plan:   Problem List Items Addressed This Visit   None   No orders of the defined types were placed in this encounter.   Follow-up: No follow-ups on file.    Aron Baba, [place]

## 2021-03-09 ENCOUNTER — Encounter: Payer: Self-pay | Admitting: *Deleted

## 2021-03-20 ENCOUNTER — Encounter: Payer: Self-pay | Admitting: *Deleted

## 2021-03-20 ENCOUNTER — Other Ambulatory Visit: Payer: Self-pay | Admitting: *Deleted

## 2021-03-20 DIAGNOSIS — R918 Other nonspecific abnormal finding of lung field: Secondary | ICD-10-CM

## 2021-03-21 ENCOUNTER — Other Ambulatory Visit: Payer: Self-pay | Admitting: Nurse Practitioner

## 2021-03-21 ENCOUNTER — Other Ambulatory Visit: Payer: Self-pay | Admitting: Physician Assistant

## 2021-03-21 DIAGNOSIS — E1140 Type 2 diabetes mellitus with diabetic neuropathy, unspecified: Secondary | ICD-10-CM

## 2021-03-21 DIAGNOSIS — I10 Essential (primary) hypertension: Secondary | ICD-10-CM

## 2021-04-07 NOTE — Progress Notes (Unsigned)
°Established patient visit ° ° °Patient: Garry M Glasner   DOB: 10/06/1960   61 y.o. Female  MRN: [id number] °Visit Date: 04/10/2021 ° °No chief complaint on file. ° °Subjective  °  °HPI  °*** °Diabetes: Pt denies increased urination or thirst. Pt reports medication compliance. No hypoglycemic events. Checking glucose at home. FBS range from*** ° °HTN: Pt denies chest pain, palpitations, dizziness or leg swelling. Taking medication as directed without side effects. Checks BP at home ***times/wk and readings range in ***. Pt follows a low salt diet. ° °HLD: Pt taking medication as directed without issues. Denies side effects including myalgias and RUQ pain.  ° ° °Medications: °Outpatient Medications Prior to Visit  °Medication Sig  ° albuterol (VENTOLIN HFA) 108 (90 Base) MCG/ACT inhaler Inhale 2 puffs into the lungs every 6 (six) hours as needed for wheezing or shortness of breath.  ° ALPRAZolam (XANAX) 0.5 MG tablet Take 1 tablet (0.5 mg total) by mouth as needed.  ° amoxicillin-clavulanate (AUGMENTIN) 875-125 MG tablet Take 1 tablet by mouth 2 (two) times daily.  ° atorvastatin (LIPITOR) 10 MG tablet TAKE 1 TABLET (10 MG TOTAL) BY MOUTH DAILY.  ° benzonatate (TESSALON) 100 MG capsule Take 1-2 capsules (100-200 mg total) by mouth 3 (three) times daily as needed.  ° bisoprolol-hydrochlorothiazide (ZIAC) 10-6.25 MG tablet Take 1 tablet by mouth daily.  ° blood glucose meter kit and supplies Dispense based on patient and insurance preference. Use up to four times daily as directed. (FOR ICD-10 E10.9, E11.9).  ° blood glucose meter kit and supplies Dispense based on patient and insurance preference. Use to check fasting blood sugar and 2 hrs after largest meal.(FOR ICD-10 E10.9, E11.9).  ° Cholecalciferol (VITAMIN D3) 5000 units CAPS Take 1 capsule (5,000 Units total) by mouth daily.  ° erythromycin ophthalmic ointment Place 1cm ribbon ointment into eyes TID for next 7 days  ° famotidine (PEPCID) 40 MG tablet Take 1  tablet (40 mg total) by mouth daily for 5 days.  ° fluticasone (FLONASE) 50 MCG/ACT nasal spray Place 2 sprays into both nostrils daily.  ° gabapentin (NEURONTIN) 100 MG capsule TAKE 1 CAPSULE BY MOUTH 2 TIMES DAILY.  ° metFORMIN (GLUCOPHAGE) 500 MG tablet TAKE 1/2 TABLET BY MOUTH 2 TIMES DAILY WITH A MEAL.  ° nitrofurantoin, macrocrystal-monohydrate, (MACROBID) 100 MG capsule Take 1 capsule (100 mg total) by mouth 2 (two) times daily.  ° omega-3 acid ethyl esters (LOVAZA) 1 g capsule TAKE 1 CAPSULE BY MOUTH DAILY.  ° omeprazole (PRILOSEC) 20 MG capsule Take 2 capsules (40 mg total) by mouth daily.  ° oxyCODONE-acetaminophen (PERCOCET) 5-325 MG tablet Take 1 tablet by mouth every 6 (six) hours as needed for severe pain.  ° promethazine-dextromethorphan (PROMETHAZINE-DM) 6.25-15 MG/5ML syrup Take 5 mLs by mouth at bedtime as needed for cough.  ° UNABLE TO FIND Med Name: "3mg nicotine pouch"  ° Vitamin D, Ergocalciferol, (DRISDOL) 1.25 MG (50000 UNIT) CAPS capsule TAKE 1 CAPSULE BY MOUTH EVERY 7 DAYS  ° °No facility-administered medications prior to visit.  ° ° °Review of Systems ° °{Labs   Heme   Chem   Endocrine   Serology   Results Review (optional):[id number]} °  Objective  °  °There were no vitals taken for this visit. °BP Readings from Last 3 Encounters:  °12/13/20 (!) 146/79  °10/10/20 136/77  °09/19/20 134/73  ° °Wt Readings from Last 3 Encounters:  °10/10/20 162 lb 1.6 oz (73.5 kg)  °09/02/20 166 lb 11.2 oz (75.6 kg)  °  06/23/20 166 lb 9.6 oz (75.6 kg)    Physical Exam  ***  No results found for any visits on 04/10/21.  Assessment & Plan     *** Problem List Items Addressed This Visit   None   No follow-ups on file.        Lorrene Reid, PA-C  [facility] [phone] (phone) [phone] (fax)  [place]

## 2021-04-10 ENCOUNTER — Ambulatory Visit: Payer: Self-pay | Admitting: Physician Assistant

## 2021-04-13 ENCOUNTER — Other Ambulatory Visit: Payer: Self-pay | Admitting: Physician Assistant

## 2021-04-13 DIAGNOSIS — G629 Polyneuropathy, unspecified: Secondary | ICD-10-CM

## 2021-04-13 DIAGNOSIS — Z0000 Encounter for general adult medical examination without abnormal findings: Secondary | ICD-10-CM

## 2021-04-13 DIAGNOSIS — E782 Mixed hyperlipidemia: Secondary | ICD-10-CM

## 2021-04-13 DIAGNOSIS — E559 Vitamin D deficiency, unspecified: Secondary | ICD-10-CM

## 2021-04-13 DIAGNOSIS — I10 Essential (primary) hypertension: Secondary | ICD-10-CM

## 2021-04-24 IMAGING — CT CT ANGIO CHEST
1 series · 17 of 32 positions shown · IV contrast (APPLIED)
Comparison: Chest MRA-03/20/2013

CLINICAL DATA: Follow-up thoracic aortic aneurysm. History of
smoking.

EXAM:
CT ANGIOGRAPHY CHEST WITH CONTRAST
TECHNIQUE: Multidetector CT imaging of the chest was performed using the
standard protocol during bolus administration of intravenous
contrast. Multiplanar CT image reconstructions and MIPs were
obtained to evaluate the vascular anatomy.
CONTRAST:  75mL [id number] IOPAMIDOL ([id number]) INJECTION 76%

[Series 4: chest angio · axial · 0.72mm/px · z∈[-351,-48]mm · 17 of 109 slices shown]
[im 4/109  lung]
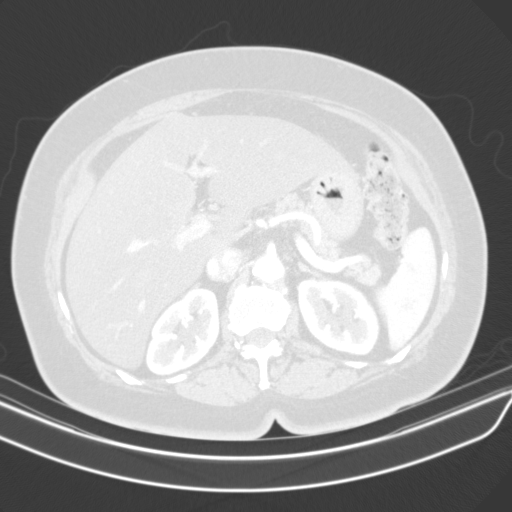
[im 11/109  soft-tissue]
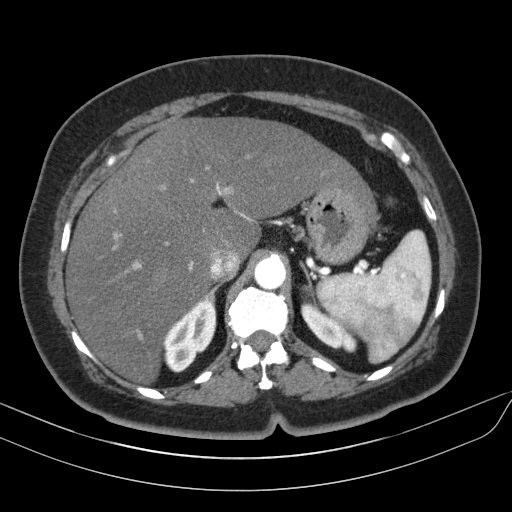
[im 18/109  lung]
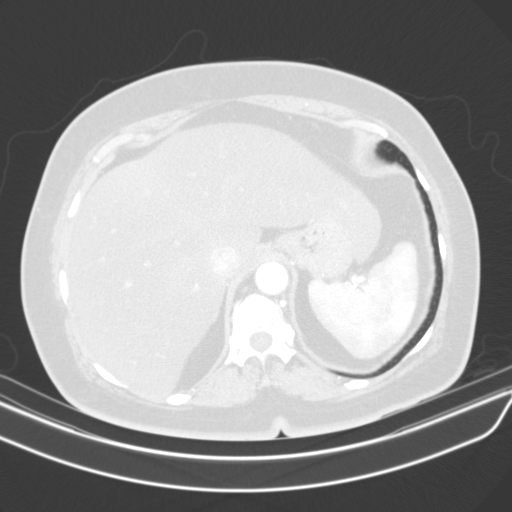
[im 25/109  soft-tissue]
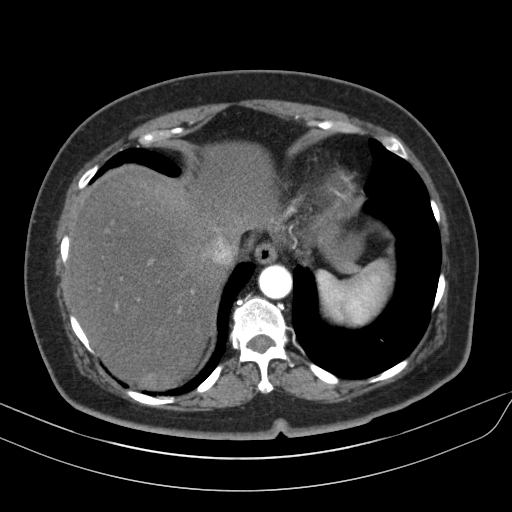
[im 28/109  lung]
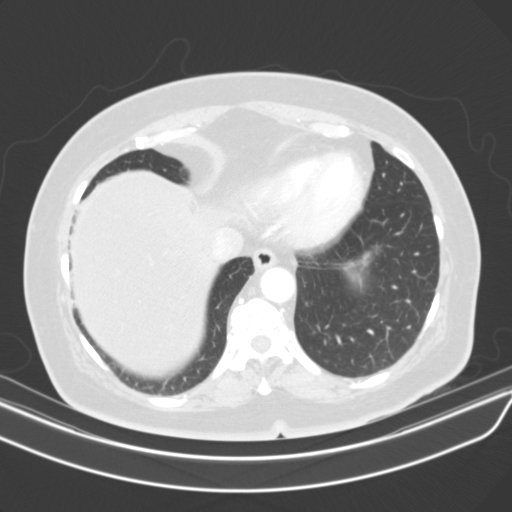
[im 35/109  soft-tissue]
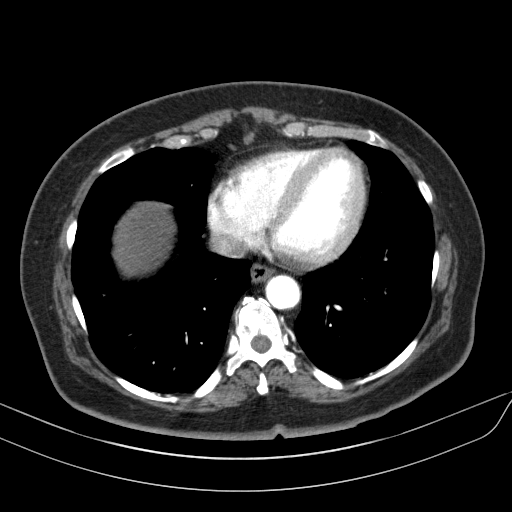
[im 42/109  lung]
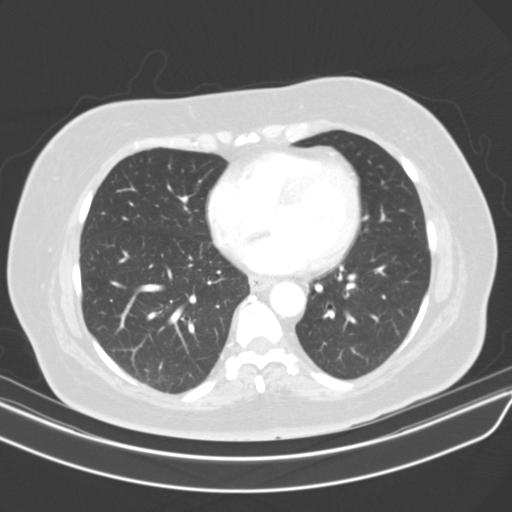
[im 49/109  soft-tissue]
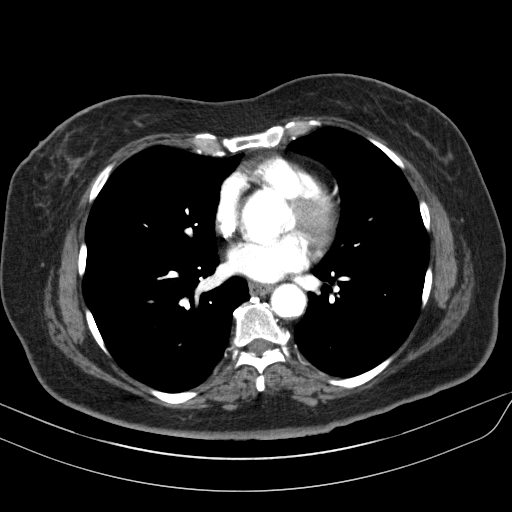
[im 56/109  lung]
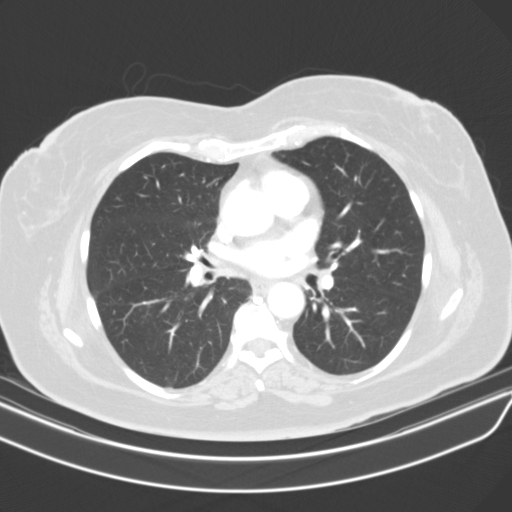
[im 60/109  soft-tissue]
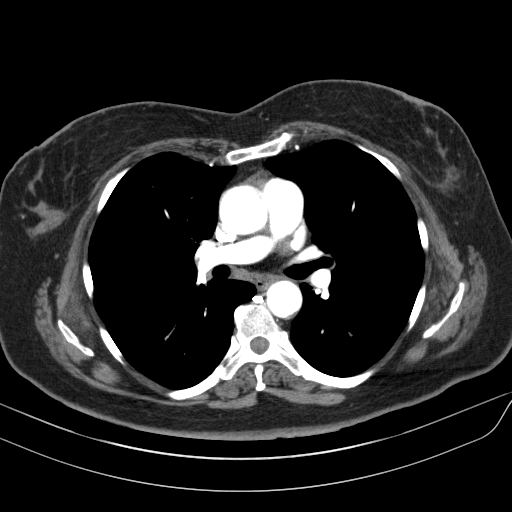
[im 67/109  lung]
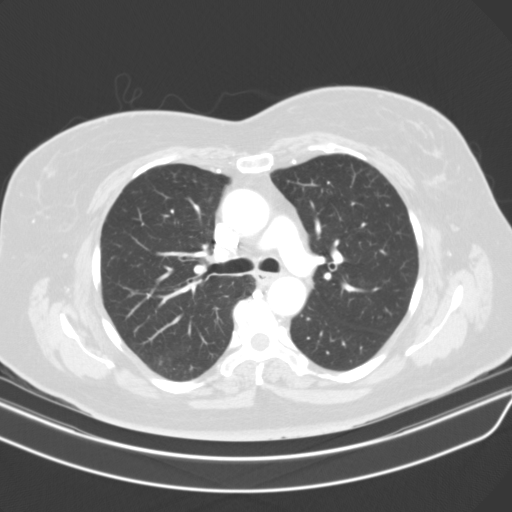
[im 74/109  soft-tissue]
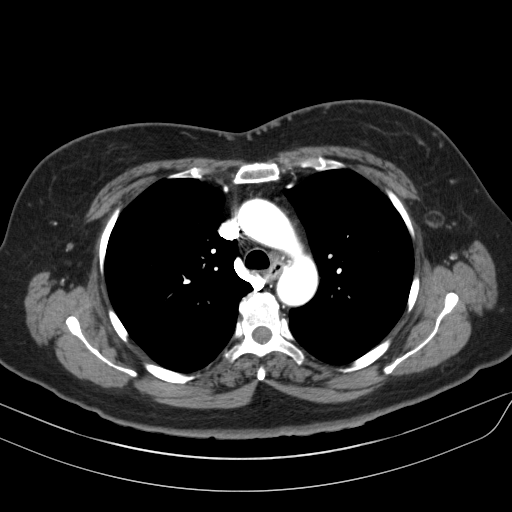
[im 81/109  lung]
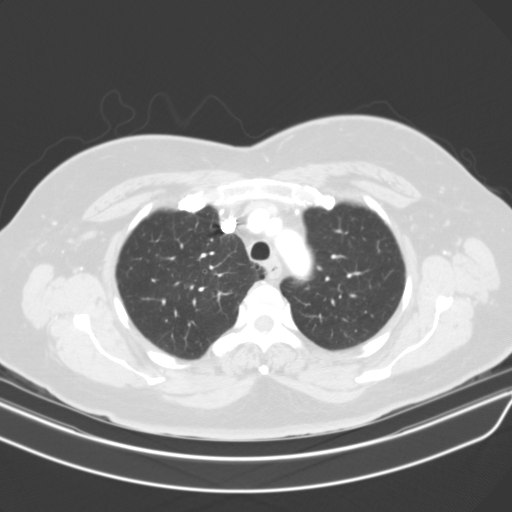
[im 84/109  soft-tissue]
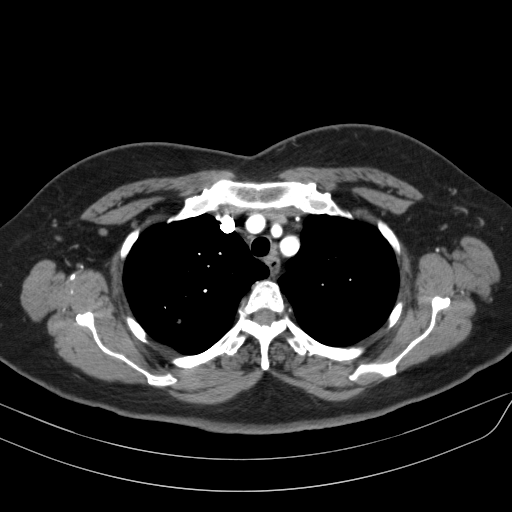
[im 91/109  lung]
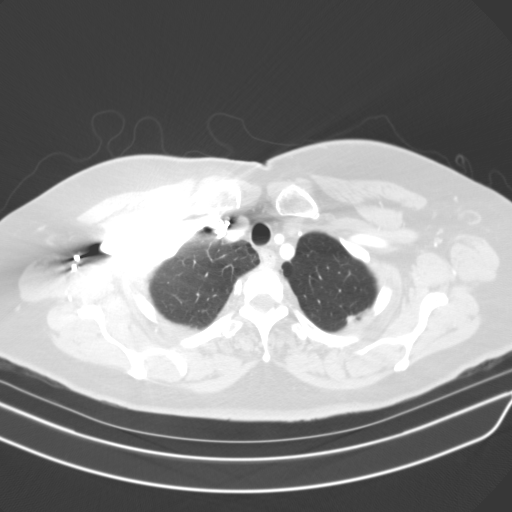
[im 98/109  soft-tissue]
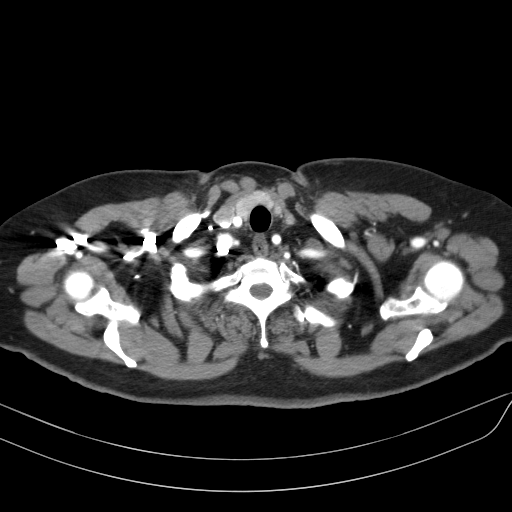
[im 105/109  lung]
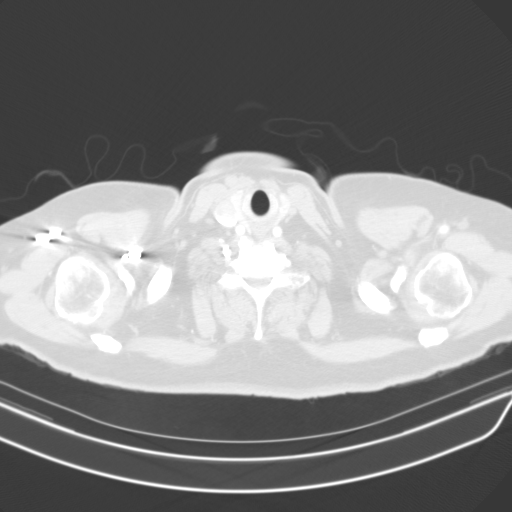

[17 of 32 positions shown; findings below may reference images not displayed]

FINDINGS: Vascular Findings:

Normal caliber of the thoracic aorta with measurements as follows,
similar to abdominal MRI performed [DATE].

Conventional configuration of the aortic arch. The branch vessels of
the aortic arch appear widely patent throughout their imaged
courses. There is a minimal amount of predominantly noncalcified
atherosclerotic plaque involving the descending thoracic aorta, not
resulting in hemodynamically significant stenosis. Note is made of a
small ductus diverticulum. No evidence of thoracic aortic dissection
or periaortic stranding on this non gated examination.

Normal heart size. No pericardial effusion. Calcifications within
the mitral valve annulus. Coronary artery calcifications.

Although this examination was not tailored for the evaluation the
pulmonary arteries, there are no discrete filling defects within the
central pulmonary arterial tree to suggest central pulmonary
embolism. Normal caliber of the main pulmonary artery.

-------------------------------------------------------------

Thoracic aortic measurements:

SINOTUBULAR JUNCTION: 31 mm as measured in greatest oblique short
axis coronal dimension.

PROXIMAL ASCENDING THORACIC AORTA: 34 mm as measured in greatest
oblique short axis axial dimension at the level of the main
pulmonary artery approximately 35 mm in greatest oblique short axis
diameter (coronal image 60, series 6)

AORTIC ARCH: 26 mm as measured in greatest oblique short axis
sagittal dimension.

PROXIMAL DESCENDING THORACIC AORTA: 24 mm as measured in greatest
oblique short axis axial dimension at the level of the main
pulmonary artery.

DISTAL DESCENDING THORACIC AORTA: 24 mm as measured in greatest
oblique short axis axial dimension at the level of the diaphragmatic
hiatus.

Review of the MIP images confirms the above findings.

-------------------------------------------------------------

Non-Vascular Findings:

Mediastinum/Lymph Nodes: No bulky mediastinal, hilar or axillary
lymphadenopathy.

Lungs/Pleura: Minimal grossly symmetric biapical pleuroparenchymal
thickening. Minimal subpleural reticular opacities are seen
involving the anterior aspect of the left upper lobe (image 59,
series 11) no discrete focal airspace opacities. No pleural effusion
or pneumothorax. No evidence of edema.

Note is made of a punctate (approximately 5 mm) nodule within the
right lung apex (image 39, series 11) as well as a smaller
(approximately 3 mm) less well-defined right lower lobe pulmonary
nodule (image 87, series 11). No discrete left-sided pulmonary
nodules.

Upper abdomen: Limited early arterial phase evaluation of the upper
abdomen demonstrates diffuse decreased attenuation of the hepatic
parenchyma suggestive of hepatic steatosis. There is a suspected
perfusional anomaly involving the subcapsular posterosuperior aspect
of the right lobe of the liver (image 85, series 4; coronal image
119, series 5), without discrete nodule. Punctate subcentimeter
hypoattenuating left-sided renal lesion is too small to adequately
characterize though favored to represent a renal cyst (image 109,
series 4).

Musculoskeletal: Regional soft tissues appear normal. Normal
appearance of the thyroid gland. No acute or aggressive osseous
abnormalities. Mild to moderate DDD within the imaged lower cervical
spine.
IMPRESSION: 1. No evidence of thoracic aortic aneurysm, dissection or periaortic
stranding on this non gated examination.
2. Coronary artery calcifications. Aortic Atherosclerosis
([id number]-XSE.E).
3. Hepatic steatosis.  Correlation with LFTs is advised.
4. Incidentally noted punctate (sub 5 mm) right-sided pulmonary
nodules. Given history of smoking, a non-contrast chest CT in 12
months is recommended. This recommendation follows the consensus
statement: Guidelines for Management of Incidental Pulmonary Nodules
Detected on CT Images: From the [HOSPITAL] 8563; Radiology

## 2021-04-29 NOTE — Progress Notes (Signed)
?Established patient visit ? ? ?Patient: Mary Wade   DOB: 26-Feb-1960   61 y.o. Female  MRN: [id number] ?Visit Date: 04/30/2021 ? ?Chief Complaint  ?Patient presents with  ? Follow-up  ? Diabetes  ? Hypertension  ? ?Subjective  ?  ?HPI  ?Patient presents for follow-up on diabetes mellitus, hypertension and hyperlipidemia. 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 Also reports still experiencing hair thinning especially near hairline. ? ?Diabetes mellitus Pt denies increased urination or thirst. Reports hypoglycemic events at night so reduced Metformin to once daily and has not had issues since then. Does not check blood sugar at home. Reduced sweets but eating more processed foods. ? ?HTN: Pt denies chest pain, shortness of breath, dizziness or lower extremity swelling. Has intermittent brief palpitations. Taking medication as directed without side effects. Pt follows a low salt diet. ? ?HLD: Pt taking medication as directed. States myalgias have been tolerable. States taking fish oil daily. ? ?Neuropathy: Patient reports Gabapentin has helped with pain and burning sensation in her feet.  ? ?Medications: ?Outpatient Medications Prior to Visit  ?Medication Sig  ? albuterol (VENTOLIN HFA) 108 (90 Base) MCG/ACT inhaler Inhale 2 puffs into the lungs every 6 (six) hours as needed for wheezing or shortness of breath.  ? bisoprolol-hydrochlorothiazide (ZIAC) 10-6.25 MG tablet Take 1 tablet by mouth daily.  ? blood glucose meter kit and supplies Dispense based on patient and insurance preference. Use up to four times daily as directed. (FOR ICD-10 E10.9, E11.9).  ? blood glucose meter kit and supplies Dispense based on patient and insurance preference. Use to check fasting blood sugar and 2 hrs after largest meal.(FOR ICD-10 E10.9, E11.9).  ? Cholecalciferol (VITAMIN D3)  5000 units CAPS Take 1 capsule (5,000 Units total) by mouth daily.  ? fluticasone (FLONASE) 50 MCG/ACT nasal spray Place 2 sprays into both nostrils daily.  ? omega-3 acid ethyl esters (LOVAZA) 1 g capsule TAKE 1 CAPSULE BY MOUTH DAILY.  ? omeprazole (PRILOSEC) 20 MG capsule Take 2 capsules (40 mg total) by mouth daily.  ? UNABLE TO FIND Med Name: "$RemoveBe'3mg'[id number]$  nicotine pouch"  ? Vitamin D, Ergocalciferol, (DRISDOL) 1.25 MG (50000 UNIT) CAPS capsule TAKE 1 CAPSULE BY MOUTH EVERY 7 DAYS NEED APPOINTMENT FOR REFILLS  ? [DISCONTINUED] ALPRAZolam (XANAX) 0.5 MG tablet Take 1 tablet (0.5 mg total) by mouth as needed.  ? [DISCONTINUED] atorvastatin (LIPITOR) 10 MG tablet TAKE 1 TABLET (10 MG TOTAL) BY MOUTH DAILY.  ? [DISCONTINUED] gabapentin (NEURONTIN) 100 MG capsule TAKE 1 CAPSULE BY MOUTH 2 TIMES DAILY. NEED APPOINTMENT FOR REFILLS  ? [DISCONTINUED] metFORMIN (GLUCOPHAGE) 500 MG tablet TAKE 1/2 TABLET BY MOUTH 2 TIMES DAILY WITH A MEAL.  ? [DISCONTINUED] amoxicillin-clavulanate (AUGMENTIN) 875-125 MG tablet Take 1 tablet by mouth 2 (two) times daily.  ? [DISCONTINUED] benzonatate (TESSALON) 100 MG capsule Take 1-2 capsules (100-200 mg total) by mouth 3 (three) times daily as needed.  ? [DISCONTINUED] erythromycin ophthalmic ointment Place 1cm ribbon ointment into eyes TID for next 7 days  ? [DISCONTINUED] famotidine (PEPCID) 40 MG tablet Take 1 tablet (40 mg total) by mouth daily for 5 days.  ? [DISCONTINUED] nitrofurantoin, macrocrystal-monohydrate, (MACROBID) 100 MG capsule Take 1 capsule (100 mg total) by mouth 2 (two) times daily.  ? [DISCONTINUED] oxyCODONE-acetaminophen (PERCOCET) 5-325 MG tablet Take 1 tablet by mouth every 6 (six)  hours as needed for severe pain.  ? [DISCONTINUED] promethazine-dextromethorphan (PROMETHAZINE-DM) 6.25-15 MG/5ML syrup Take 5 mLs by mouth at bedtime as needed for cough.  ? ?No facility-administered medications prior to visit.  ? ? ?Review of Systems ?Review of Systems:  ?A fourteen system  review of systems was performed and found to be positive as per HPI. ? ? ?Last CBC ?Lab Results  ?Component Value Date  ? WBC 9.5 10/10/2020  ? HGB 14.8 10/10/2020  ? HCT 44.1 10/10/2020  ? MCV 93 10/10/2020  ? MCH 31.1 10/10/2020  ? RDW 13.0 10/10/2020  ? PLT 298 10/10/2020  ? ?Last metabolic panel ?Lab Results  ?Component Value Date  ? GLUCOSE 121 (H) 10/10/2020  ? NA 139 10/10/2020  ? K 4.3 10/10/2020  ? CL 98 10/10/2020  ? CO2 23 10/10/2020  ? BUN 13 10/10/2020  ? CREATININE 0.47 (L) 10/10/2020  ? EGFR 110 10/10/2020  ? CALCIUM 9.6 10/10/2020  ? PROT 7.3 10/10/2020  ? ALBUMIN 5.2 (H) 10/10/2020  ? LABGLOB 2.1 10/10/2020  ? AGRATIO 2.5 (H) 10/10/2020  ? BILITOT 0.4 10/10/2020  ? ALKPHOS 77 10/10/2020  ? AST 29 10/10/2020  ? ALT 38 (H) 10/10/2020  ? ANIONGAP 8 06/21/2020  ? ?Last lipids ?Lab Results  ?Component Value Date  ? CHOL 197 10/10/2020  ? HDL 37 (L) 10/10/2020  ? Petros 90 10/10/2020  ? LDLDIRECT 140.2 01/31/2014  ? TRIG 429 (H) 10/10/2020  ? CHOLHDL 5.3 (H) 10/10/2020  ? ?Last hemoglobin A1c ?Lab Results  ?Component Value Date  ? HGBA1C 6.0 (A) 04/30/2021  ? ?Last thyroid functions ?Lab Results  ?Component Value Date  ? TSH 0.803 10/10/2020  ? ?Last vitamin D ?Lab Results  ?Component Value Date  ? VD25OH 26.4 (L) 10/26/2019  ? ?  ?  Objective  ?  ?BP 100/61   Pulse 62   Temp 97.6 ?F (36.4 ?C)   Ht $R'5\' 6"'JF$  (1.676 m)   Wt 159 lb (72.1 kg)   SpO2 98%   BMI 25.66 kg/m?  ?BP Readings from Last 3 Encounters:  ?04/30/21 100/61  ?12/13/20 (!) 146/79  ?10/10/20 136/77  ? ?Wt Readings from Last 3 Encounters:  ?04/30/21 159 lb (72.1 kg)  ?10/10/20 162 lb 1.6 oz (73.5 kg)  ?09/02/20 166 lb 11.2 oz (75.6 kg)  ? ? ?Physical Exam  ?General: Pleasant and cooperative, appropriate for stated age.  ?Neuro:  Alert and oriented,  extra-ocular muscles intact  ?HEENT:  Normocephalic, atraumatic, neck supple  ?Skin:  no gross rash, warm, pink. ?Cardiac:  RRR, S1 S2 ?Respiratory: CTA B/L  ?Vascular:  Ext warm, no cyanosis  apprec.; cap RF less 2 sec. ?Psych:  No HI/SI, judgement and insight good, Euthymic mood. Full Affect. ? ? ?Results for orders placed or performed in visit on 04/30/21  ?POCT glycosylated hemoglobin (Hb A1C)  ?Result Value Ref Range  ? Hemoglobin A1C 6.0 (A) 4.0 - 5.6 %  ? HbA1c POC (<> result, manual entry)    ? HbA1c, POC (prediabetic range)    ? HbA1c, POC (controlled diabetic range)    ? ? Assessment & Plan  ?  ? ? ?Problem List Items Addressed This Visit   ? ?  ? Cardiovascular and Mediastinum  ? Essential hypertension (Chronic)  ?  -Controlled. Discussed soft blood pressure in office. Recommend to monitor BP/pulse at home and if BP consistently >105/60 then should notify the office for medication adjustments. Pt verbalized understanding. Will continue current medication regimen, see med  list. Will collect CMP for medication monitoring.  ?  ?  ? Relevant Medications  ? atorvastatin (LIPITOR) 10 MG tablet  ? Other Relevant Orders  ? Comprehensive metabolic panel  ?  ? Endocrine  ? Type 2 diabetes mellitus with diabetic neuropathy, without long-term current use of insulin ([place]) - Primary  ?  -A1c today has improved from 6.7 to 6.0, recommend to continue with Metformin 1/2 tablet PO in the morning, updated medication list. Continue ambulatory glucose monitoring. Discussed diabetic diet low in carbohydrate and glucose. Will continue to monitor. ?  ?  ? Relevant Medications  ? metFORMIN (GLUCOPHAGE) 500 MG tablet  ? atorvastatin (LIPITOR) 10 MG tablet  ? Other Relevant Orders  ? POCT glycosylated hemoglobin (Hb A1C) (Completed)  ?  ? Nervous and Auditory  ? Neuropathy  ?  -Improved and stable. ?-Continue current medication regimen. See med list. ?-Will continue to monitor. ?  ?  ? Relevant Medications  ? gabapentin (NEURONTIN) 100 MG capsule  ?  ? Other  ? Vitamin D deficiency (Chronic)  ?  -Will repeat Vitamin D. Pending results will send additional refills of Vitamin D 50,000 units once a week or adjust treatment  plan.  ?  ?  ? Relevant Orders  ? VITAMIN D 25 Hydroxy (Vit-D Deficiency, Fractures)  ? Mixed hyperlipidemia  ?  -Last lipid panel: HDL 37, LDL 90 (goal <70). ?-Patient has tried other statins and did not tole

## 2021-04-30 ENCOUNTER — Ambulatory Visit: Payer: BC Managed Care – PPO | Admitting: Physician Assistant

## 2021-04-30 ENCOUNTER — Other Ambulatory Visit: Payer: Self-pay

## 2021-04-30 ENCOUNTER — Encounter: Payer: Self-pay | Admitting: Physician Assistant

## 2021-04-30 VITALS — BP 100/61 | HR 62 | Temp 97.6°F | Ht 66.0 in | Wt 159.0 lb

## 2021-04-30 DIAGNOSIS — E559 Vitamin D deficiency, unspecified: Secondary | ICD-10-CM | POA: Diagnosis not present

## 2021-04-30 DIAGNOSIS — E1140 Type 2 diabetes mellitus with diabetic neuropathy, unspecified: Secondary | ICD-10-CM | POA: Diagnosis not present

## 2021-04-30 DIAGNOSIS — W278XXA Contact with other nonpowered hand tool, initial encounter: Secondary | ICD-10-CM | POA: Diagnosis not present

## 2021-04-30 DIAGNOSIS — I10 Essential (primary) hypertension: Secondary | ICD-10-CM | POA: Diagnosis not present

## 2021-04-30 LAB — POCT GLYCOSYLATED HEMOGLOBIN (HGB A1C): Hemoglobin A1C: 6 % — AB (ref 4.0–5.6)

## 2021-04-30 MED ORDER — METFORMIN HCL 500 MG PO TABS: 250.0000 mg | ORAL_TABLET | Freq: Every day | ORAL | 0 refills | Status: DC

## 2021-04-30 MED ORDER — GABAPENTIN 100 MG PO CAPS: 100.0000 mg | ORAL_CAPSULE | Freq: Two times a day (BID) | ORAL | 0 refills | Status: DC

## 2021-04-30 MED ORDER — ATORVASTATIN CALCIUM 10 MG PO TABS: 10.0000 mg | ORAL_TABLET | Freq: Every day | ORAL | 1 refills | Status: DC

## 2021-04-30 MED ORDER — ALPRAZOLAM 0.5 MG PO TABS: 0.5000 mg | ORAL_TABLET | ORAL | 0 refills | Status: DC | PRN

## 2021-04-30 NOTE — Assessment & Plan Note (Signed)
-  Controlled. Discussed soft blood pressure in office. Recommend to monitor BP/pulse at home and if BP consistently >105/60 then should notify the office for medication adjustments. Pt verbalized understanding. Will continue current medication regimen, see med list. Will collect CMP for medication monitoring.  ?

## 2021-04-30 NOTE — Patient Instructions (Signed)

## 2021-04-30 NOTE — Assessment & Plan Note (Signed)
-  A1c today has improved from 6.7 to 6.0, recommend to continue with Metformin 1/2 tablet PO in the morning, updated medication list. Continue ambulatory glucose monitoring. Discussed diabetic diet low in carbohydrate and glucose. Will continue to monitor. ?

## 2021-04-30 NOTE — Assessment & Plan Note (Signed)
-  Improved and stable. ?-Continue current medication regimen. See med list. ?-Will continue to monitor. ?

## 2021-04-30 NOTE — Assessment & Plan Note (Signed)
-  Will repeat Vitamin D. Pending results will send additional refills of Vitamin D 50,000 units once a week or adjust treatment plan.  ?

## 2021-04-30 NOTE — Assessment & Plan Note (Addendum)
-  Last lipid panel: HDL 37, LDL 90 (goal <70). ?-Patient has tried other statins and did not tolerate well, overall tolerating atorvastatin 10 mg so will continue current medication regimen. Will repeat lipid panel at follow-up visit. ?-Follow a heart healthy diet low in fat.  ?-Will continue to monitor. ?

## 2021-05-01 LAB — COMPREHENSIVE METABOLIC PANEL
ALT: 28 IU/L (ref 0–32)
AST: 24 IU/L (ref 0–40)
Albumin/Globulin Ratio: 2.2 (ref 1.2–2.2)
Albumin: 4.7 g/dL (ref 3.8–4.9)
Alkaline Phosphatase: 81 IU/L (ref 44–121)
BUN/Creatinine Ratio: 22 (ref 12–28)
BUN: 11 mg/dL (ref 8–27)
Bilirubin Total: 0.3 mg/dL (ref 0.0–1.2)
CO2: 24 mmol/L (ref 20–29)
Calcium: 9.7 mg/dL (ref 8.7–10.3)
Chloride: 100 mmol/L (ref 96–106)
Creatinine, Ser: 0.51 mg/dL — ABNORMAL LOW (ref 0.57–1.00)
Globulin, Total: 2.1 g/dL (ref 1.5–4.5)
Sodium: 143 mmol/L (ref 134–144)
Total Protein: 6.8 g/dL (ref 6.0–8.5)
eGFR: 107 mL/min/{1.73_m2} (ref >59)

## 2021-05-01 LAB — HIV ANTIBODY (ROUTINE TESTING W REFLEX): HIV Screen 4th Generation wRfx: NONREACTIVE

## 2021-05-01 LAB — HEPATITIS C ANTIBODY: Hep C Virus Ab: NONREACTIVE

## 2021-05-01 LAB — VITAMIN D 25 HYDROXY (VIT D DEFICIENCY, FRACTURES): Vit D, 25-Hydroxy: 65.2 ng/mL (ref 30.0–100.0)

## 2021-06-01 NOTE — Progress Notes (Signed)
? ? ? ? ?HPI: FU bicuspid aortic valve. ABIs June 2015 normal.  Monitor September 2015 showed sinus with PVCs.  Exercise treadmill September 2016 negative.  Brain MRA October 2021 showed slight vascular prominence of the right M2 region unchanged from 2015.  Echocardiogram December 2021 showed normal LV function, mildly dilated aortic root at 40 mm, probable bicuspid aortic valve with fusion of left and right coronary cusps, moderate aortic insufficiency.  CTA February 2022 showed no thoracic aortic aneurysm but there was note of coronary calcification.  There was note of a 5 mm right-sided pulmonary nodule and follow-up recommended in 12 months.  Carotid Dopplers May 2022 showed less than 50% stenosis on the left and right.  Since last seen, she has dyspnea with more vigorous activities but not routine activities.  No orthopnea, PND, pedal edema, chest pain or syncope.  Occasional palpitations. ? ? ?Past Medical History:  ?Diagnosis Date  ? Aortic insufficiency   ? a. 10/2013: mod AI by echo.  ? Bicuspid aortic valve   ? a. 10/24/13 showed EF 55-60%, no RWMA, bicuspid AV with mod AI, mildly dilated ascending aorta, PASP .  ? Cerebral aneurysm   ? Chronic diarrhea   ? Fibromyalgia   ? Gallstones   ? GERD (gastroesophageal reflux disease)   ? Heart murmur   ? Hyperlipidemia   ? Hypertension   ? Pancreatitis   ? 2 previous episodes, last one 2013  ? PVC's (premature ventricular contractions)   ? a. 10/2013 event monitor: NSR and PVCs.  ? Tobacco abuse   ? ? ?Past Surgical History:  ?Procedure Laterality Date  ? ABDOMINAL HYSTERECTOMY    ? APPENDECTOMY    ? CHOLECYSTECTOMY    ? ? ?Social History  ? ?Socioeconomic History  ? Marital status: Single  ?  Spouse name: Not on file  ? Number of children: 2  ? Years of education: Not on file  ? Highest education level: Not on file  ?Occupational History  ?  Employer: [employer]  ?  Comment: Dental office  ?Tobacco Use  ? Smoking status: Former  ?  Packs/day: 1.00  ?  Years:  42.00  ?  Pack years: 42.00  ?  Types: Cigarettes  ?  Quit date: 01/23/2016  ?  Years since quitting: 5.3  ? Smokeless tobacco: Never  ?Vaping Use  ? Vaping Use: Never used  ?Substance and Sexual Activity  ? Alcohol use: Yes  ?  Comment: occasionally  ? Drug use: No  ? Sexual activity: Not on file  ?Other Topics Concern  ? Not on file  ?Social History Narrative  ? Single mother  ? Daughter and son, about to adopt a foster child in upcoming 2017  ? Dental hygienist  ? ?Social Determinants of Health  ? ?Financial Resource Strain: Not on file  ?Food Insecurity: Not on file  ?Transportation Needs: Not on file  ?Physical Activity: Not on file  ?Stress: Not on file  ?Social Connections: Not on file  ?Intimate Partner Violence: Not on file  ? ? ?Family History  ?Problem Relation Age of Onset  ? CAD Mother   ?     Died at 12, several blockages but patient unknown when this started  ? Aneurysm Mother   ?     Brain aneurysm - died of this during surgery  ? Hypertension Mother   ? Hyperlipidemia Mother   ? Thyroid disease Mother   ? Heart disease Father   ?  Died of MI at age 52 - enlarged heart  ? Heart attack Father   ? Early death Father   ? Diabetes Paternal Grandmother   ? Esophageal cancer Cousin   ? Ulcerative colitis Daughter   ? Diabetes Daughter   ? Diabetes Son   ? ? ?ROS: no fevers or chills, productive cough, hemoptysis, dysphasia, odynophagia, melena, hematochezia, dysuria, hematuria, rash, seizure activity, orthopnea, PND, pedal edema, claudication. Remaining systems are negative. ? ?Physical Exam: ?Well-developed well-nourished in no acute distress.  ?Skin is warm and dry.  ?HEENT is normal.  ?Neck is supple.  ?Chest is clear to auscultation with normal expansion.  ?Cardiovascular exam is regular rate and rhythm.  2/6 diastolic murmur left sternal border. ?Abdominal exam nontender or distended. No masses palpated. ?Extremities show no edema. ?neuro grossly intact ? ?ECG-normal sinus rhythm at a rate of 71, no  ST changes.  Personally reviewed ? ?A/P ? ?1 bicuspid aortic valve with moderate aortic insufficiency-we will arrange follow-up echocardiogram.  She understands she will likely require aortic valve replacement in the future. ? ?2 question history of right MCA ectasia versus aneurysm-follow-up MRA unchanged.  Will not pursue further evaluation at this point. ? ?3 hypertension-blood pressure elevated but she states typically controlled.  Continue present medications and follow. ? ?4 history of coronary calcification-continue statin. ? ?5 hyperlipidemia-check lipids.  If LDL greater than 70 will need to advance Lipitor. ? ?6 tobacco abuse-patient has discontinued. ? ?7 history of pulmonary nodule-we will arrange follow-up noncontrast chest CT. ? ?Olga Millers, MD ? ? ? ?

## 2021-06-05 ENCOUNTER — Encounter: Payer: Self-pay | Admitting: Cardiology

## 2021-06-05 ENCOUNTER — Ambulatory Visit: Payer: BC Managed Care – PPO | Admitting: Cardiology

## 2021-06-05 VITALS — BP 150/60 | HR 71 | Ht 64.0 in | Wt 159.4 lb

## 2021-06-05 DIAGNOSIS — Q231 Congenital insufficiency of aortic valve: Secondary | ICD-10-CM

## 2021-06-05 DIAGNOSIS — I10 Essential (primary) hypertension: Secondary | ICD-10-CM

## 2021-06-05 DIAGNOSIS — E7800 Pure hypercholesterolemia, unspecified: Secondary | ICD-10-CM

## 2021-06-05 DIAGNOSIS — Q2381 Bicuspid aortic valve: Secondary | ICD-10-CM

## 2021-06-05 DIAGNOSIS — R918 Other nonspecific abnormal finding of lung field: Secondary | ICD-10-CM | POA: Diagnosis not present

## 2021-06-05 NOTE — Patient Instructions (Signed)
Lab Work: ? ?Your physician recommends that you return for lab work FASTING ? ?If you have labs (blood work) drawn today and your tests are completely normal, you will receive your results only by: ?MyChart Message (if you have MyChart) OR ?A paper copy in the mail ?If you have any lab test that is abnormal or we need to change your treatment, we will call you to review the results. ? ? ?Testing/Procedures: ? ?Your physician has requested that you have an echocardiogram. Echocardiography is a painless test that uses sound waves to create images of your heart. It provides your doctor with information about the size and shape of your heart and how well your heart?s chambers and valves are working. This procedure takes approximately one hour. There are no restrictions for this procedure. [address] ? ?CT WO CONTRAST AT [address] ? ? ?Follow-Up: ?At [facility], you and your health needs are our priority.  As part of our continuing mission to provide you with exceptional heart care, we have created designated Provider Care Teams.  These Care Teams include your primary Cardiologist (physician) and Advanced Practice Providers (APPs -  Physician Assistants and Nurse Practitioners) who all work together to provide you with the care you need, when you need it. ? ?We recommend signing up for the patient portal called "MyChart".  Sign up information is provided on this After Visit Summary.  MyChart is used to connect with patients for Virtual Visits (Telemedicine).  Patients are able to view lab/test results, encounter notes, upcoming appointments, etc.  Non-urgent messages can be sent to your provider as well.   ?To learn more about what you can do with MyChart, go to [email or web address].   ? ?Your next appointment:   ?12 month(s) ? ?The format for your next appointment:   ?In Person ? ?Provider:   ?Olga Millers MD  ? ? ? ? ?Important Information About Sugar ? ? ? ? ?  ?

## 2021-06-10 ENCOUNTER — Encounter: Payer: Self-pay | Admitting: Cardiology

## 2021-06-22 ENCOUNTER — Ambulatory Visit (HOSPITAL_COMMUNITY): Payer: BC Managed Care – PPO | Attending: Cardiology

## 2021-06-22 DIAGNOSIS — Q231 Congenital insufficiency of aortic valve: Secondary | ICD-10-CM | POA: Diagnosis not present

## 2021-06-22 LAB — ECHOCARDIOGRAM COMPLETE
AR max vel: 1.47 cm2
AV Area VTI: 1.52 cm2
AV Area mean vel: 1.32 cm2
AV Mean grad: 10 mmHg
AV Peak grad: 18.5 mmHg
Ao pk vel: 2.15 m/s
Area-P 1/2: 3.37 cm2
P 1/2 time: 371 msec
S' Lateral: 3.3 cm

## 2021-06-23 ENCOUNTER — Encounter: Payer: Self-pay | Admitting: Cardiology

## 2021-06-29 ENCOUNTER — Other Ambulatory Visit: Payer: Self-pay | Admitting: Physician Assistant

## 2021-06-29 DIAGNOSIS — E782 Mixed hyperlipidemia: Secondary | ICD-10-CM

## 2021-06-29 DIAGNOSIS — I10 Essential (primary) hypertension: Secondary | ICD-10-CM

## 2021-07-03 NOTE — Telephone Encounter (Signed)
Spoke with patient to clarify there was no CXR ordered. She was given the number so she can schedule her chest CT without contrast.

## 2021-07-31 ENCOUNTER — Ambulatory Visit
Admission: RE | Admit: 2021-07-31 | Discharge: 2021-07-31 | Disposition: A | Discharge status: Home / Self Care | Payer: BC Managed Care – PPO | Source: Ambulatory Visit | Attending: Cardiology | Admitting: Cardiology

## 2021-07-31 DIAGNOSIS — R918 Other nonspecific abnormal finding of lung field: Secondary | ICD-10-CM

## 2021-07-31 DIAGNOSIS — R911 Solitary pulmonary nodule: Secondary | ICD-10-CM | POA: Diagnosis not present

## 2021-08-04 ENCOUNTER — Other Ambulatory Visit: Payer: Self-pay | Admitting: *Deleted

## 2021-08-04 DIAGNOSIS — N281 Cyst of kidney, acquired: Secondary | ICD-10-CM

## 2021-08-04 NOTE — Progress Notes (Signed)
us

## 2021-10-06 ENCOUNTER — Other Ambulatory Visit: Payer: Self-pay | Admitting: Physician Assistant

## 2021-10-06 DIAGNOSIS — G629 Polyneuropathy, unspecified: Secondary | ICD-10-CM

## 2021-10-06 DIAGNOSIS — I10 Essential (primary) hypertension: Secondary | ICD-10-CM

## 2021-11-24 DIAGNOSIS — Z1231 Encounter for screening mammogram for malignant neoplasm of breast: Secondary | ICD-10-CM

## 2021-12-07 ENCOUNTER — Other Ambulatory Visit: Payer: Self-pay | Admitting: Neurosurgery

## 2021-12-07 DIAGNOSIS — I671 Cerebral aneurysm, nonruptured: Secondary | ICD-10-CM

## 2022-01-20 ENCOUNTER — Other Ambulatory Visit: Payer: Self-pay | Admitting: Nurse Practitioner

## 2022-01-20 ENCOUNTER — Other Ambulatory Visit: Payer: Self-pay | Admitting: Cardiology

## 2022-01-20 DIAGNOSIS — G629 Polyneuropathy, unspecified: Secondary | ICD-10-CM

## 2022-01-20 DIAGNOSIS — E1140 Type 2 diabetes mellitus with diabetic neuropathy, unspecified: Secondary | ICD-10-CM

## 2022-01-20 DIAGNOSIS — Z0000 Encounter for general adult medical examination without abnormal findings: Secondary | ICD-10-CM

## 2022-01-20 DIAGNOSIS — Z1231 Encounter for screening mammogram for malignant neoplasm of breast: Secondary | ICD-10-CM

## 2022-01-20 DIAGNOSIS — I10 Essential (primary) hypertension: Secondary | ICD-10-CM

## 2022-01-20 DIAGNOSIS — E782 Mixed hyperlipidemia: Secondary | ICD-10-CM

## 2022-01-21 ENCOUNTER — Ambulatory Visit
Admission: RE | Admit: 2022-01-21 | Discharge: 2022-01-21 | Disposition: A | Discharge status: Home / Self Care | Payer: 59 | Source: Ambulatory Visit | Attending: Physician Assistant | Admitting: Physician Assistant

## 2022-01-21 DIAGNOSIS — Z1231 Encounter for screening mammogram for malignant neoplasm of breast: Secondary | ICD-10-CM

## 2022-02-23 ENCOUNTER — Other Ambulatory Visit: Payer: Self-pay | Admitting: Nurse Practitioner

## 2022-02-23 DIAGNOSIS — G629 Polyneuropathy, unspecified: Secondary | ICD-10-CM

## 2022-02-23 DIAGNOSIS — I10 Essential (primary) hypertension: Secondary | ICD-10-CM

## 2022-04-01 ENCOUNTER — Other Ambulatory Visit: Payer: Self-pay | Admitting: Nurse Practitioner

## 2022-04-01 DIAGNOSIS — I10 Essential (primary) hypertension: Secondary | ICD-10-CM

## 2022-04-05 ENCOUNTER — Other Ambulatory Visit: Payer: Self-pay | Admitting: Nurse Practitioner

## 2022-04-05 DIAGNOSIS — I10 Essential (primary) hypertension: Secondary | ICD-10-CM

## 2022-04-05 DIAGNOSIS — G629 Polyneuropathy, unspecified: Secondary | ICD-10-CM

## 2022-04-05 DIAGNOSIS — E1140 Type 2 diabetes mellitus with diabetic neuropathy, unspecified: Secondary | ICD-10-CM

## 2022-04-05 DIAGNOSIS — E782 Mixed hyperlipidemia: Secondary | ICD-10-CM

## 2022-04-05 DIAGNOSIS — Z0000 Encounter for general adult medical examination without abnormal findings: Secondary | ICD-10-CM

## 2022-04-08 NOTE — Progress Notes (Signed)
Established patient visit   Patient: Mary Wade   DOB: [birth year]   62 y.o. Female  MRN: [id number] Visit Date: 04/09/2022   Chief Complaint  Patient presents with   Medication Refill   Subjective    HPI  Follow up -hypertension  --typically well controlled  -type 2 diabetes --taking metformin  --most recent HgbA1c done 04/2021 and was 6.0 --will check HgbA1c with outer, routine labs today  --due for urine microalbumin  --due for routine, fasting labs  Has been out of several of her medications for a few days  Has been out of gabapentin -feet are killing her.  -out of Lovaza.  Does take alprazolam on occasion  Moderate to severe eczema on palms of hands and soles of fee.  Currently just using lotion      Review of Systems  Constitutional:  Negative for activity change, appetite change, chills, fatigue and fever.  HENT:  Negative for congestion, postnasal drip, rhinorrhea, sinus pressure, sinus pain, sneezing and sore throat.   Eyes: Negative.   Respiratory:  Negative for cough, chest tightness, shortness of breath and wheezing.   Cardiovascular:  Negative for chest pain and palpitations.  Gastrointestinal:  Negative for abdominal pain, constipation, diarrhea, nausea and vomiting.  Endocrine: Negative for cold intolerance, heat intolerance, polydipsia and polyuria.       Almost out of her metformin. Does have neuropathy in her feet. Out of gabapentin.   Genitourinary:  Negative for dyspareunia, dysuria, flank pain, frequency and urgency.  Musculoskeletal:  Negative for arthralgias, back pain and myalgias.  Skin:  Negative for rash.  Allergic/Immunologic: Negative for environmental allergies.  Neurological:  Positive for numbness. Negative for dizziness, weakness and headaches.  Hematological:  Negative for adenopathy.  Psychiatric/Behavioral:  The patient is nervous/anxious.        Objective     Today's Vitals   04/09/22 0816  BP: 127/76  Pulse: 77  SpO2:  97%  Weight: 146 lb 1.9 oz (66.3 kg)  Height: 5\' 4"  (1.626 m)   Body mass index is 25.08 kg/m.  BP Readings from Last 3 Encounters:  05/07/22 115/60  04/09/22 127/76  06/05/21 (Abnormal) 150/60    Wt Readings from Last 3 Encounters:  05/07/22 166 lb (75.3 kg)  04/09/22 146 lb 1.9 oz (66.3 kg)  06/05/21 159 lb 6.4 oz (72.3 kg)    Physical Exam Vitals and nursing note reviewed.  Constitutional:      Appearance: Normal appearance. She is well-developed.  HENT:     Head: Normocephalic and atraumatic.     Nose: Nose normal.     Mouth/Throat:     Mouth: Mucous membranes are moist.     Pharynx: Oropharynx is clear.  Eyes:     Extraocular Movements: Extraocular movements intact.     Conjunctiva/sclera: Conjunctivae normal.     Pupils: Pupils are equal, round, and reactive to light.  Neck:     Vascular: No carotid bruit.  Cardiovascular:     Rate and Rhythm: Normal rate and regular rhythm.     Pulses: Normal pulses.     Heart sounds: Normal heart sounds.  Pulmonary:     Effort: Pulmonary effort is normal.     Breath sounds: Normal breath sounds.  Abdominal:     Palpations: Abdomen is soft.  Musculoskeletal:        General: Normal range of motion.     Cervical back: Normal range of motion and neck supple.  Lymphadenopathy:     Cervical:  No cervical adenopathy.  Skin:    General: Skin is warm and dry.     Capillary Refill: Capillary refill takes less than 2 seconds.  Neurological:     General: No focal deficit present.     Mental Status: She is alert and oriented to person, place, and time.  Psychiatric:        Mood and Affect: Mood normal.        Behavior: Behavior normal.        Thought Content: Thought content normal.        Judgment: Judgment normal.      Results for orders placed or performed in visit on 04/09/22  CBC  Result Value Ref Range   WBC 8.3 3.4 - 10.8 x10E3/uL   RBC 4.38 3.77 - 5.28 x10E6/uL   Hemoglobin 13.3 11.1 - 15.9 g/dL   Hematocrit 39.5  34.0 - 46.6 %   MCV 90 79 - 97 fL   MCH 30.4 26.6 - 33.0 pg   MCHC 33.7 31.5 - 35.7 g/dL   RDW 12.3 11.7 - 15.4 %   Platelets 277 150 - 450 x10E3/uL  Comprehensive metabolic panel  Result Value Ref Range   Glucose 143 (H) 70 - 99 mg/dL   BUN 11 8 - 27 mg/dL   Creatinine, Ser 0.41 (L) 0.57 - 1.00 mg/dL   eGFR 112 >59 mL/min/1.73   BUN/Creatinine Ratio 27 12 - 28   Sodium 141 134 - 144 mmol/L   Potassium 4.2 3.5 - 5.2 mmol/L   Chloride 104 96 - 106 mmol/L   CO2 22 20 - 29 mmol/L   Calcium 8.9 8.7 - 10.3 mg/dL   Total Protein 6.3 6.0 - 8.5 g/dL   Albumin 4.2 3.9 - 4.9 g/dL   Globulin, Total 2.1 1.5 - 4.5 g/dL   Albumin/Globulin Ratio 2.0 1.2 - 2.2   Bilirubin Total 0.3 0.0 - 1.2 mg/dL   Alkaline Phosphatase 88 44 - 121 IU/L   AST 26 0 - 40 IU/L   ALT 35 (H) 0 - 32 IU/L  Lipid panel  Result Value Ref Range   Cholesterol, Total 187 100 - 199 mg/dL   Triglycerides 360 (H) 0 - 149 mg/dL   HDL 37 (L) >39 mg/dL   VLDL Cholesterol Cal 60 (H) 5 - 40 mg/dL   LDL Chol Calc (NIH) 90 0 - 99 mg/dL   Chol/HDL Ratio 5.1 (H) 0.0 - 4.4 ratio  Hemoglobin A1c  Result Value Ref Range   Hgb A1c MFr Bld 7.2 (H) 4.8 - 5.6 %   Est. average glucose Bld gHb Est-mCnc 160 mg/dL  VITAMIN D 25 Hydroxy (Vit-D Deficiency, Fractures)  Result Value Ref Range   Vit D, 25-Hydroxy 16.1 (L) 30.0 - 100.0 ng/mL  TSH + free T4  Result Value Ref Range   TSH 1.420 0.450 - 4.500 uIU/mL   Free T4 0.92 0.82 - 1.77 ng/dL  POCT UA - Microalbumin  Result Value Ref Range   Microalbumin Ur, POC 30 mg/L   Creatinine, POC 100 mg/dL   Albumin/Creatinine Ratio, Urine, POC 30-300     Assessment & Plan    Type 2 diabetes mellitus with diabetic neuropathy, without long-term current use of insulin (HCC) Assessment & Plan: Check HgbA1c along with routine, fasting labs. Microalbumin slightly abnormal.  Adjust medication as indicated.   Orders: -     POCT UA - Microalbumin -     metFORMIN HCl; TAKE 1/2 TABLET BY MOUTH 2  TIMES DAILY  WITH A MEAL.  Dispense: 90 tablet; Refill: 1 -     Hemoglobin A1c; Future  Hypertension associated with type 2 diabetes mellitus ([place]) Assessment & Plan: Stable. Continue current medication.   Orders: -     Comprehensive metabolic panel; Future -     CBC; Future  Hyperlipidemia associated with type 2 diabetes mellitus ([place]) Assessment & Plan: Check fating lipid panel today and adjust medication as indicated.   Orders: -     Omega-3-acid Ethyl Esters; Take 1 capsule (1 g total) by mouth daily.  Dispense: 90 capsule; Refill: 1 -     Lipid panel; Future  Neuropathy Assessment & Plan: Generally stable. Continue current medication.   Orders: -     Gabapentin; Take 1 capsule po QPM  Dispense: 90 capsule; Refill: 1  Vitamin D deficiency Assessment & Plan: Check vitamin d level and treat deficiency as indicated.    Orders: -     VITAMIN D 25 Hydroxy (Vit-D Deficiency, Fractures); Future  Panic attack as reaction to stress Assessment & Plan: May take alprazolam 0.5 ,g daily if needed. New prescription sent to her pharmacy today.   Orders: -     ALPRAZolam; Take 1 tablet (0.5 mg total) by mouth as needed.  Dispense: 30 tablet; Refill: 2  Atopic dermatitis, unspecified type -     Clobetasol Propionate; Apply 1 Application topically 2 (two) times daily.  Dispense: 60 g; Refill: 3  Other fatigue Assessment & Plan: Check labs for further evaluation .  Orders: -     TSH + free T4; Future -     VITAMIN D 25 Hydroxy (Vit-D Deficiency, Fractures); Future  Healthcare maintenance  Other orders -     Omeprazole; Take 2 capsules (40 mg total) by mouth daily.  Dispense: 90 capsule; Refill: 1    Return in about 3 months (around 07/08/2022) for diabetes with HgbA1c check, .      Ronnell Freshwater, NP  [facility] [phone] (phone) [phone] (fax)  [place]

## 2022-04-09 ENCOUNTER — Ambulatory Visit: Payer: 59 | Admitting: Nurse Practitioner

## 2022-04-09 ENCOUNTER — Encounter: Payer: Self-pay | Admitting: Nurse Practitioner

## 2022-04-09 VITALS — BP 127/76 | HR 77 | Ht 64.0 in | Wt 146.1 lb

## 2022-04-09 DIAGNOSIS — L209 Atopic dermatitis, unspecified: Secondary | ICD-10-CM

## 2022-04-09 DIAGNOSIS — E559 Vitamin D deficiency, unspecified: Secondary | ICD-10-CM | POA: Diagnosis not present

## 2022-04-09 DIAGNOSIS — E1140 Type 2 diabetes mellitus with diabetic neuropathy, unspecified: Secondary | ICD-10-CM

## 2022-04-09 DIAGNOSIS — R5383 Other fatigue: Secondary | ICD-10-CM

## 2022-04-09 DIAGNOSIS — E782 Mixed hyperlipidemia: Secondary | ICD-10-CM

## 2022-04-09 DIAGNOSIS — E1169 Type 2 diabetes mellitus with other specified complication: Secondary | ICD-10-CM | POA: Diagnosis not present

## 2022-04-09 DIAGNOSIS — G629 Polyneuropathy, unspecified: Secondary | ICD-10-CM

## 2022-04-09 DIAGNOSIS — I10 Essential (primary) hypertension: Secondary | ICD-10-CM

## 2022-04-09 DIAGNOSIS — E1159 Type 2 diabetes mellitus with other circulatory complications: Secondary | ICD-10-CM

## 2022-04-09 DIAGNOSIS — F430 Acute stress reaction: Secondary | ICD-10-CM

## 2022-04-09 DIAGNOSIS — I152 Hypertension secondary to endocrine disorders: Secondary | ICD-10-CM

## 2022-04-09 DIAGNOSIS — E785 Hyperlipidemia, unspecified: Secondary | ICD-10-CM

## 2022-04-09 DIAGNOSIS — Z0000 Encounter for general adult medical examination without abnormal findings: Secondary | ICD-10-CM

## 2022-04-09 LAB — POCT UA - MICROALBUMIN
Creatinine, POC: 100 mg/dL
Microalbumin Ur, POC: 30 mg/L

## 2022-04-09 MED ORDER — CLOBETASOL PROPIONATE 0.05 % EX CREA: 1.0000 | TOPICAL_CREAM | Freq: Two times a day (BID) | CUTANEOUS | 3 refills | Status: DC

## 2022-04-09 MED ORDER — OMEGA-3-ACID ETHYL ESTERS 1 G PO CAPS: 1.0000 | ORAL_CAPSULE | Freq: Every day | ORAL | 1 refills | Status: DC

## 2022-04-09 MED ORDER — ATORVASTATIN CALCIUM 10 MG PO TABS: 10.0000 mg | ORAL_TABLET | Freq: Every day | ORAL | 1 refills | Status: DC

## 2022-04-09 MED ORDER — BISOPROLOL-HYDROCHLOROTHIAZIDE 10-6.25 MG PO TABS: ORAL_TABLET | ORAL | 1 refills | Status: DC

## 2022-04-09 MED ORDER — OMEPRAZOLE 20 MG PO CPDR: 40.0000 mg | DELAYED_RELEASE_CAPSULE | Freq: Every day | ORAL | 1 refills | Status: DC

## 2022-04-09 MED ORDER — GABAPENTIN 300 MG PO CAPS: ORAL_CAPSULE | ORAL | 1 refills | Status: DC

## 2022-04-09 MED ORDER — METFORMIN HCL 500 MG PO TABS: ORAL_TABLET | ORAL | 1 refills | Status: DC

## 2022-04-09 MED ORDER — ALPRAZOLAM 0.5 MG PO TABS: 0.5000 mg | ORAL_TABLET | ORAL | 2 refills | Status: DC | PRN

## 2022-04-10 ENCOUNTER — Encounter: Payer: Self-pay | Admitting: Nurse Practitioner

## 2022-04-10 LAB — VITAMIN D 25 HYDROXY (VIT D DEFICIENCY, FRACTURES): Vit D, 25-Hydroxy: 16.1 ng/mL — ABNORMAL LOW (ref 30.0–100.0)

## 2022-04-10 LAB — COMPREHENSIVE METABOLIC PANEL
ALT: 35 IU/L — ABNORMAL HIGH (ref 0–32)
AST: 26 IU/L (ref 0–40)
Albumin/Globulin Ratio: 2 (ref 1.2–2.2)
Albumin: 4.2 g/dL (ref 3.9–4.9)
Alkaline Phosphatase: 88 IU/L (ref 44–121)
BUN/Creatinine Ratio: 27 (ref 12–28)
BUN: 11 mg/dL (ref 8–27)
Bilirubin Total: 0.3 mg/dL (ref 0.0–1.2)
CO2: 22 mmol/L (ref 20–29)
Calcium: 8.9 mg/dL (ref 8.7–10.3)
Chloride: 104 mmol/L (ref 96–106)
Creatinine, Ser: 0.41 mg/dL — ABNORMAL LOW (ref 0.57–1.00)
Globulin, Total: 2.1 g/dL (ref 1.5–4.5)
Glucose: 143 mg/dL — ABNORMAL HIGH (ref 70–99)
Potassium: 4.2 mmol/L (ref 3.5–5.2)
Sodium: 141 mmol/L (ref 134–144)
Total Protein: 6.3 g/dL (ref 6.0–8.5)
eGFR: 112 mL/min/{1.73_m2} (ref >59)

## 2022-04-10 LAB — LIPID PANEL
Chol/HDL Ratio: 5.1 ratio — ABNORMAL HIGH (ref 0.0–4.4)
Cholesterol, Total: 187 mg/dL (ref 100–199)
HDL: 37 mg/dL — ABNORMAL LOW (ref >39)
LDL Chol Calc (NIH): 90 mg/dL (ref 0–99)
Triglycerides: 360 mg/dL — ABNORMAL HIGH (ref 0–149)
VLDL Cholesterol Cal: 60 mg/dL — ABNORMAL HIGH (ref 5–40)

## 2022-04-10 LAB — CBC
Hematocrit: 39.5 % (ref 34.0–46.6)
Hemoglobin: 13.3 g/dL (ref 11.1–15.9)
MCH: 30.4 pg (ref 26.6–33.0)
MCHC: 33.7 g/dL (ref 31.5–35.7)
MCV: 90 fL (ref 79–97)
Platelets: 277 10*3/uL (ref 150–450)
RBC: 4.38 x10E6/uL (ref 3.77–5.28)
RDW: 12.3 % (ref 11.7–15.4)
WBC: 8.3 10*3/uL (ref 3.4–10.8)

## 2022-04-10 LAB — TSH+FREE T4
Free T4: 0.92 ng/dL (ref 0.82–1.77)
TSH: 1.42 u[IU]/mL (ref 0.450–4.500)

## 2022-04-10 LAB — HEMOGLOBIN A1C
Est. average glucose Bld gHb Est-mCnc: 160 mg/dL
Hgb A1c MFr Bld: 7.2 % — ABNORMAL HIGH (ref 4.8–5.6)

## 2022-04-26 ENCOUNTER — Telehealth: Payer: Self-pay | Admitting: *Deleted

## 2022-04-26 NOTE — Telephone Encounter (Signed)
Pt calling very concerned about her labs that were drawn on 04/09/2022.  She said that there is some abnormal results and she has not heard from [place] about these.  She is scheduled to come in and see provider on 05/07/22 to talk about medications review labs and steps that need to be taken and she would like an update on her labs until then.  Please advise. Routing to provider who ordered labs and also to provider/PCP who will be seeing her on 05/07/22.

## 2022-04-26 NOTE — Telephone Encounter (Signed)
Patient has been informed of provider's response to follow up result questions. Patient verbalized understanding. All questions and concerns have been addressed.

## 2022-05-04 ENCOUNTER — Other Ambulatory Visit: Payer: Self-pay | Admitting: Family Medicine

## 2022-05-04 DIAGNOSIS — I10 Essential (primary) hypertension: Secondary | ICD-10-CM

## 2022-05-06 NOTE — Progress Notes (Signed)
Established Patient Office Visit  Subjective   Patient ID: Mary Wade, female    DOB: 1961-01-24  Age: 62 y.o. MRN: [id number]  Chief Complaint  Patient presents with   Follow-up    Lab results    HPI Mary Wade is a 62 y.o. female presenting today to discuss her lab results as she had some questions and concerns.  She is most concerned about the increase in her ALT and what that means for her liver.  In the past, she took pravastatin which caused an increase in liver enzymes, so she had to switch to the atorvastatin that she is currently taking.  Her cholesterol levels are also high currently.  She also saw that her vitamin D level was low and did restart the prescription strength vitamin D as she had a couple of tablets left.  This has greatly improved the fatigue that she was previously experiencing.  She has only been taking half a tablet of metformin in the morning and half a tablet in the evening, but she also admits that she has been having more candy the past few months.  Because of this, she was not surprised to see that her A1c increased.   ROS Negative unless otherwise noted in HPI   Objective:     BP 115/60 (BP Location: Left Arm, Patient Position: Sitting, Cuff Size: Large)   Pulse 66   Temp 97.6 F (36.4 C) (Oral)   Resp 18   Ht 5\' 4"  (1.626 m)   Wt 166 lb (75.3 kg)   SpO2 95%   BMI 28.49 kg/m   Physical Exam Constitutional:      General: She is not in acute distress.    Appearance: Normal appearance.  HENT:     Head: Normocephalic and atraumatic.  Pulmonary:     Effort: Pulmonary effort is normal. No respiratory distress.  Musculoskeletal:     Cervical back: Normal range of motion.  Neurological:     General: No focal deficit present.     Mental Status: She is alert and oriented to person, place, and time. Mental status is at baseline.  Psychiatric:        Mood and Affect: Mood normal.        Thought Content: Thought content normal.         Judgment: Judgment normal.     Assessment & Plan:  Type 2 diabetes mellitus with diabetic neuropathy, without long-term current use of insulin (HCC) Assessment & Plan: Continue metformin 500 mg half tablet p.o. in the morning and half tablet p.o. in the evening.  Limit carbs and sugary foods.  Will recheck A1c at follow-up visit and adjust medication as needed.  Orders: -     Hemoglobin A1c; Future  Mixed hyperlipidemia Assessment & Plan: Last lipid panel: LDL 90, HDL 37, triglycerides 360.  Patient has tolerated atorvastatin thus far.  Increase to atorvastatin 20 mg daily.  Follow heart healthy diet low in fat.  Will continue to monitor, repeat lipid panel in 2 months.  Orders: -     Atorvastatin Calcium; Take 1 tablet (20 mg total) by mouth daily.  Dispense: 30 tablet; Refill: 2 -     Lipid panel; Future  Vitamin D deficiency Assessment & Plan: Restart vitamin D 50,000 units weekly, will recheck vitamin D in 2 months at follow-up.  Orders: -     Vitamin D (Ergocalciferol); Take 1 capsule (50,000 Units total) by mouth every 7 (seven) days.  Dispense:  5 capsule; Refill: 0 -     VITAMIN D 25 Hydroxy (Vit-D Deficiency, Fractures); Future  Essential hypertension Assessment & Plan: Stable.  Continue bisoprolol-HCTZ 10-6.25 mg tablets daily.  Will continue to monitor.  Repeat CBC and CMP at follow-up appointment.  Orders: -     CBC with Differential/Platelet; Future -     Comprehensive metabolic panel; Future    Return in about 2 months (around 07/07/2022) for follow-up, fasting blood work 1 week before.    Mary Harman, PA

## 2022-05-07 ENCOUNTER — Encounter: Payer: Self-pay | Admitting: Family Medicine

## 2022-05-07 ENCOUNTER — Ambulatory Visit: Payer: 59 | Admitting: Family Medicine

## 2022-05-07 VITALS — BP 115/60 | HR 66 | Temp 97.6°F | Resp 18 | Ht 64.0 in | Wt 166.0 lb

## 2022-05-07 DIAGNOSIS — E1140 Type 2 diabetes mellitus with diabetic neuropathy, unspecified: Secondary | ICD-10-CM

## 2022-05-07 DIAGNOSIS — K219 Gastro-esophageal reflux disease without esophagitis: Secondary | ICD-10-CM

## 2022-05-07 DIAGNOSIS — E559 Vitamin D deficiency, unspecified: Secondary | ICD-10-CM | POA: Diagnosis not present

## 2022-05-07 DIAGNOSIS — E782 Mixed hyperlipidemia: Secondary | ICD-10-CM | POA: Diagnosis not present

## 2022-05-07 DIAGNOSIS — I10 Essential (primary) hypertension: Secondary | ICD-10-CM

## 2022-05-07 MED ORDER — VITAMIN D (ERGOCALCIFEROL) 1.25 MG (50000 UNIT) PO CAPS: 50000.0000 [IU] | ORAL_CAPSULE | ORAL | 0 refills | Status: DC

## 2022-05-07 MED ORDER — ATORVASTATIN CALCIUM 20 MG PO TABS: 20.0000 mg | ORAL_TABLET | Freq: Every day | ORAL | 2 refills | Status: DC

## 2022-05-07 NOTE — Assessment & Plan Note (Signed)
Last lipid panel: LDL 90, HDL 37, triglycerides 360.  Patient has tolerated atorvastatin thus far.  Increase to atorvastatin 20 mg daily.  Follow heart healthy diet low in fat.  Will continue to monitor, repeat lipid panel in 2 months.

## 2022-05-07 NOTE — Assessment & Plan Note (Signed)
Stable.  Continue bisoprolol-HCTZ 10-6.25 mg tablets daily.  Will continue to monitor.  Repeat CBC and CMP at follow-up appointment.

## 2022-05-07 NOTE — Patient Instructions (Signed)
Restart the prescription strength vitamin D.  We will increase your Lipitor to 20 mg daily for your cholesterol.  Continue with metformin half a tablet in the morning half a tablet in the evening for now.  Try your best to limit your candy and carbs for the next couple of months.  We will recheck your A1c in 2 months and make adjustments as needed.

## 2022-05-07 NOTE — Assessment & Plan Note (Signed)
Restart vitamin D 50,000 units weekly, will recheck vitamin D in 2 months at follow-up.

## 2022-05-07 NOTE — Assessment & Plan Note (Addendum)
Continue metformin 500 mg half tablet p.o. in the morning and half tablet p.o. in the evening.  Limit carbs and sugary foods.  Will recheck A1c at follow-up visit and adjust medication as needed.

## 2022-05-09 DIAGNOSIS — L209 Atopic dermatitis, unspecified: Secondary | ICD-10-CM | POA: Insufficient documentation

## 2022-05-09 DIAGNOSIS — F410 Panic disorder [episodic paroxysmal anxiety] without agoraphobia: Secondary | ICD-10-CM | POA: Insufficient documentation

## 2022-05-09 DIAGNOSIS — R5383 Other fatigue: Secondary | ICD-10-CM | POA: Insufficient documentation

## 2022-05-09 NOTE — Assessment & Plan Note (Signed)
Check vitamin d level and treat deficiency as indicated.

## 2022-05-09 NOTE — Assessment & Plan Note (Signed)
Stable.  Continue current medication

## 2022-05-09 NOTE — Assessment & Plan Note (Signed)
Check labs for further evaluation .

## 2022-05-09 NOTE — Assessment & Plan Note (Signed)
May take alprazolam 0.5 ,g daily if needed. New prescription sent to her pharmacy today.

## 2022-05-09 NOTE — Assessment & Plan Note (Signed)
Generally stable. Continue current medication.

## 2022-05-09 NOTE — Assessment & Plan Note (Signed)
Check HgbA1c along with routine, fasting labs. Microalbumin slightly abnormal.  Adjust medication as indicated.

## 2022-05-09 NOTE — Assessment & Plan Note (Signed)
Check fating lipid panel today and adjust medication as indicated.

## 2022-07-02 ENCOUNTER — Other Ambulatory Visit: Payer: 59

## 2022-07-02 DIAGNOSIS — E559 Vitamin D deficiency, unspecified: Secondary | ICD-10-CM

## 2022-07-02 DIAGNOSIS — I10 Essential (primary) hypertension: Secondary | ICD-10-CM

## 2022-07-02 DIAGNOSIS — E1140 Type 2 diabetes mellitus with diabetic neuropathy, unspecified: Secondary | ICD-10-CM

## 2022-07-02 DIAGNOSIS — E782 Mixed hyperlipidemia: Secondary | ICD-10-CM

## 2022-07-03 LAB — CBC WITH DIFFERENTIAL/PLATELET
Basophils Absolute: 0.1 10*3/uL (ref 0.0–0.2)
Basos: 1 %
EOS (ABSOLUTE): 0.3 10*3/uL (ref 0.0–0.4)
Eos: 4 %
Hematocrit: 40.9 % (ref 34.0–46.6)
Hemoglobin: 13.5 g/dL (ref 11.1–15.9)
Immature Grans (Abs): 0 10*3/uL (ref 0.0–0.1)
Immature Granulocytes: 0 %
Lymphocytes Absolute: 2.8 10*3/uL (ref 0.7–3.1)
Lymphs: 41 %
MCH: 30.7 pg (ref 26.6–33.0)
MCHC: 33 g/dL (ref 31.5–35.7)
MCV: 93 fL (ref 79–97)
Monocytes Absolute: 0.5 10*3/uL (ref 0.1–0.9)
Monocytes: 8 %
Neutrophils Absolute: 3.2 10*3/uL (ref 1.4–7.0)
Neutrophils: 46 %
Platelets: 256 10*3/uL (ref 150–450)
RBC: 4.4 x10E6/uL (ref 3.77–5.28)
RDW: 12.6 % (ref 11.7–15.4)
WBC: 6.8 10*3/uL (ref 3.4–10.8)

## 2022-07-03 LAB — COMPREHENSIVE METABOLIC PANEL
ALT: 38 IU/L — ABNORMAL HIGH (ref 0–32)
AST: 31 IU/L (ref 0–40)
Albumin/Globulin Ratio: 2.2 (ref 1.2–2.2)
Albumin: 4.4 g/dL (ref 3.9–4.9)
Alkaline Phosphatase: 82 IU/L (ref 44–121)
BUN/Creatinine Ratio: 17 (ref 12–28)
BUN: 10 mg/dL (ref 8–27)
Bilirubin Total: 0.3 mg/dL (ref 0.0–1.2)
CO2: 24 mmol/L (ref 20–29)
Calcium: 9.2 mg/dL (ref 8.7–10.3)
Chloride: 102 mmol/L (ref 96–106)
Creatinine, Ser: 0.58 mg/dL (ref 0.57–1.00)
Globulin, Total: 2 g/dL (ref 1.5–4.5)
Glucose: 141 mg/dL — ABNORMAL HIGH (ref 70–99)
Potassium: 4 mmol/L (ref 3.5–5.2)
Sodium: 143 mmol/L (ref 134–144)
Total Protein: 6.4 g/dL (ref 6.0–8.5)
eGFR: 103 mL/min/{1.73_m2} (ref >59)

## 2022-07-03 LAB — LIPID PANEL
Chol/HDL Ratio: 3.6 ratio (ref 0.0–4.4)
Cholesterol, Total: 149 mg/dL (ref 100–199)
HDL: 41 mg/dL (ref >39)
LDL Chol Calc (NIH): 71 mg/dL (ref 0–99)
Triglycerides: 226 mg/dL — ABNORMAL HIGH (ref 0–149)
VLDL Cholesterol Cal: 37 mg/dL (ref 5–40)

## 2022-07-03 LAB — HEMOGLOBIN A1C
Est. average glucose Bld gHb Est-mCnc: 154 mg/dL
Hgb A1c MFr Bld: 7 % — ABNORMAL HIGH (ref 4.8–5.6)

## 2022-07-03 LAB — VITAMIN D 25 HYDROXY (VIT D DEFICIENCY, FRACTURES): Vit D, 25-Hydroxy: 53.8 ng/mL (ref 30.0–100.0)

## 2022-07-09 ENCOUNTER — Encounter: Payer: Self-pay | Admitting: Family Medicine

## 2022-07-09 ENCOUNTER — Ambulatory Visit: Payer: 59 | Admitting: Family Medicine

## 2022-07-09 VITALS — BP 128/76 | HR 74 | Resp 18 | Ht 64.0 in | Wt 163.0 lb

## 2022-07-09 DIAGNOSIS — E1140 Type 2 diabetes mellitus with diabetic neuropathy, unspecified: Secondary | ICD-10-CM

## 2022-07-09 DIAGNOSIS — E559 Vitamin D deficiency, unspecified: Secondary | ICD-10-CM | POA: Diagnosis not present

## 2022-07-09 DIAGNOSIS — I10 Essential (primary) hypertension: Secondary | ICD-10-CM

## 2022-07-09 DIAGNOSIS — E782 Mixed hyperlipidemia: Secondary | ICD-10-CM

## 2022-07-09 NOTE — Assessment & Plan Note (Signed)
Cholesterol levels have improved, LDL at 71, HDL 41, triglycerides still elevated at 226.  Encouraged to continue with lifestyle changes and we will monitor triglycerides.  If they continue to remain high, recommend adding fenofibrate.  Patient is agreeable to this plan.  Continue atorvastatin 20 mg daily.  Suspect that this is likely because of mildly elevated ALT, we hope that this is a transient elevation.  Will continue to monitor.

## 2022-07-09 NOTE — Assessment & Plan Note (Signed)
BP goal <130/80.  Stable, at goal.  Continue bisoprolol-hydrochlorothiazide 10-6.25 mg daily.  Most recent CMP essentially within normal limits other than mildly elevated ALT which we will monitor.

## 2022-07-09 NOTE — Progress Notes (Signed)
Established Patient Office Visit  Subjective   Patient ID: Mary Wade, female    DOB: [birth year]  Age: 62 y.o. MRN: [id number]  Chief Complaint  Patient presents with   Diabetes    fasting    HPI AMA MCFERRIN is a 62 y.o. female presenting today for follow up of hypertension, hyperlipidemia, diabetes. Hypertension: Patient here for follow-up of elevated blood pressure. Pt denies chest pain, SOB, dizziness, edema, syncope, fatigue or heart palpitations. Taking bisoprolol-hydrochlorothiazide, reports excellent compliance with treatment. Denies side effects.  She has experienced an increase in palpitations and some swelling in her ankles.  She called her cardiologist and initially was not able to get in with Dr. Jens Som until November, and declined an earlier appointment with their PA.  Today she is rethinking that and is planning to call them after her appointment here to get the earliest appointment available. Hyperlipidemia: tolerating atorvastatin well with no myalgias or significant side effects. Currently consuming a low fat, low carb diet.  The 10-year ASCVD risk score (Arnett DK, et al., 2019) is: 9% Diabetes: denies hypoglycemic events, wounds or sores that are not healing well, increased thirst or urination. Denies vision problems, eye exam due. Taking metformin as prescribed without any side effects.   ROS Negative unless otherwise noted in HPI   Objective:     BP 128/76 (BP Location: Left Arm, Patient Position: Sitting, Cuff Size: Normal)   Pulse 74   Resp 18   Ht 5\' 4"  (1.626 m)   Wt 163 lb (73.9 kg)   SpO2 96%   BMI 27.98 kg/m   Physical Exam Constitutional:      General: She is not in acute distress.    Appearance: Normal appearance.  HENT:     Head: Normocephalic and atraumatic.  Cardiovascular:     Rate and Rhythm: Normal rate and regular rhythm.     Heart sounds: No murmur heard.    No friction rub. No gallop.  Pulmonary:     Effort: Pulmonary effort  is normal. No respiratory distress.     Breath sounds: No wheezing, rhonchi or rales.  Musculoskeletal:        General: Swelling (Bilateral feet and ankles, nonpitting) present. Normal range of motion.  Skin:    General: Skin is warm and dry.  Neurological:     Mental Status: She is alert and oriented to person, place, and time.     Assessment & Plan:  Essential hypertension Assessment & Plan: BP goal <130/80.  Stable, at goal.  Continue bisoprolol-hydrochlorothiazide 10-6.25 mg daily.  Most recent CMP essentially within normal limits other than mildly elevated ALT which we will monitor.   Type 2 diabetes mellitus with diabetic neuropathy, without long-term current use of insulin (HCC) Assessment & Plan: A1c has improved from 7.2 to 7.0.  Congratulated patient on her progress and encouraged her to continue her efforts.  Continue metformin 250 mg in the morning and 250 mg in the evening.  Encouraged to continue with low-carb diet and routine physical activity.  Will continue to monitor.   Mixed hyperlipidemia Assessment & Plan: Cholesterol levels have improved, LDL at 71, HDL 41, triglycerides still elevated at 226.  Encouraged to continue with lifestyle changes and we will monitor triglycerides.  If they continue to remain high, recommend adding fenofibrate.  Patient is agreeable to this plan.  Continue atorvastatin 20 mg daily.  Suspect that this is likely because of mildly elevated ALT, we hope that this is a  transient elevation.  Will continue to monitor.   Vitamin D deficiency Assessment & Plan: Continue over-the-counter vitamin D3 supplement to maintain vitamin D levels.   Encourage patient to call cardiology to get first available appointment with APP to assess increase in palpitation frequency and new foot/ankle swelling.  Patient in agreement and plans to call them after leaving our office.  Return in about 3 months (around 10/09/2022) for follow-up for HTN, HLD, DM, fasting  blood work 1 week before.    Melida Quitter, PA

## 2022-07-09 NOTE — Assessment & Plan Note (Signed)
A1c has improved from 7.2 to 7.0.  Congratulated patient on her progress and encouraged her to continue her efforts.  Continue metformin 250 mg in the morning and 250 mg in the evening.  Encouraged to continue with low-carb diet and routine physical activity.  Will continue to monitor.

## 2022-07-09 NOTE — Assessment & Plan Note (Signed)
Continue over-the-counter vitamin D3 supplement to maintain vitamin D levels.

## 2022-07-09 NOTE — Patient Instructions (Signed)
Call your cardiology office today to be seen at the earliest appointment.  Keep up the great work!

## 2022-08-16 NOTE — Progress Notes (Signed)
Labs reviewed with patient.

## 2022-09-04 ENCOUNTER — Other Ambulatory Visit: Payer: Self-pay | Admitting: Family Medicine

## 2022-09-04 DIAGNOSIS — E782 Mixed hyperlipidemia: Secondary | ICD-10-CM

## 2022-09-20 ENCOUNTER — Ambulatory Visit: Payer: 59 | Admitting: Family Medicine

## 2022-09-20 VITALS — BP 118/78 | HR 70 | Temp 97.6°F | Resp 18 | Ht 64.0 in | Wt 165.0 lb

## 2022-09-20 DIAGNOSIS — H6993 Unspecified Eustachian tube disorder, bilateral: Secondary | ICD-10-CM | POA: Diagnosis not present

## 2022-09-20 DIAGNOSIS — J069 Acute upper respiratory infection, unspecified: Secondary | ICD-10-CM | POA: Diagnosis not present

## 2022-09-20 LAB — POCT INFLUENZA A/B
Influenza A, POC: NEGATIVE
Influenza B, POC: NEGATIVE

## 2022-09-20 MED ORDER — BENZONATATE 200 MG PO CAPS: 200.0000 mg | ORAL_CAPSULE | Freq: Two times a day (BID) | ORAL | 0 refills | Status: DC | PRN

## 2022-09-20 MED ORDER — FLUTICASONE PROPIONATE 50 MCG/ACT NA SUSP
2.0000 | Freq: Every day | NASAL | 6 refills | Status: AC
Start: 2022-09-20 — End: ?

## 2022-09-20 NOTE — Progress Notes (Signed)
Acute Office Visit  Subjective:     Patient ID: Mary Wade, female    DOB: 01/29/1961, 62 y.o.   MRN: [id number]  Chief Complaint  Patient presents with   URI   Cough   Diarrhea        Ear Fullness    HPI Patient is in today for cough, diarrhea, ear fullness, sinus pressure. Upper Respiratory Infection: Patient complains of symptoms of a URI. Symptoms include congestion, cough, and diarrhea.  The diarrhea started several days before the URI symptoms.  Onset of symptoms was 3 days ago, gradually worsening since that time. She also c/o achiness and sinus pressure for the past 3 days .  She is drinking plenty of fluids. Evaluation to date: none. Treatment to date:  Over-the-counter DayQuil .  She took a COVID test at home on Friday and the URI symptoms started and at that time it was negative.  ROS    Objective:    BP 118/78 (BP Location: Left Arm, Patient Position: Sitting, Cuff Size: Normal)   Pulse 70   Temp 97.6 F (36.4 C)   Resp 18   Ht 5\' 4"  (1.626 m)   Wt 165 lb (74.8 kg)   SpO2 95%   BMI 28.32 kg/m   Physical Exam Constitutional:      General: She is not in acute distress.    Appearance: Normal appearance.  HENT:     Head: Normocephalic and atraumatic.     Right Ear: Tympanic membrane, ear canal and external ear normal.     Left Ear: Tympanic membrane, ear canal and external ear normal.     Nose: Congestion and rhinorrhea present.     Mouth/Throat:     Mouth: Mucous membranes are moist.     Pharynx: No posterior oropharyngeal erythema.  Eyes:     Extraocular Movements: Extraocular movements intact.     Conjunctiva/sclera: Conjunctivae normal.     Pupils: Pupils are equal, round, and reactive to light.  Cardiovascular:     Rate and Rhythm: Regular rhythm.     Heart sounds: No murmur heard.    No friction rub. No gallop.  Pulmonary:     Effort: Pulmonary effort is normal. No respiratory distress.     Breath sounds: No wheezing, rhonchi or rales.   Musculoskeletal:     Cervical back: Normal range of motion and neck supple. No tenderness.  Lymphadenopathy:     Cervical: No cervical adenopathy.  Skin:    General: Skin is warm and dry.  Neurological:     Mental Status: She is alert and oriented to person, place, and time.    Results for orders placed or performed in visit on 09/20/22  POCT Influenza A/B  Result Value Ref Range   Influenza A, POC Negative Negative   Influenza B, POC Negative Negative      Assessment & Plan:  Upper respiratory tract infection, unspecified type -     POCT Influenza A/B -     Novel Coronavirus, NAA ([facility]) -     Fluticasone Propionate; Place 2 sprays into both nostrils daily.  Dispense: 16 g; Refill: 6 -     Benzonatate; Take 1 capsule (200 mg total) by mouth 2 (two) times daily as needed for cough.  Dispense: 20 capsule; Refill: 0  Dysfunction of both eustachian tubes -     Fluticasone Propionate; Place 2 sprays into both nostrils daily.  Dispense: 16 g; Refill: 6  Negative point-of-care for influenza, sending  out COVID-19 test.  Lungs CTA bilaterally, no adventitious lung sounds.  We discussed that this early in her course of symptoms, URI is most likely caused by a virus rather than a bacteria.  Management will include adequate hydration, rest as needed, and symptomatic management.  Prescribing Tessalon Perles as cough suppressant as well as Flonase for sinus pressure.  Both medications are safe to take with over-the-counter DayQuil.  Encourage patient to get in touch with the office if symptoms persist for total of 7 to 10 days, at that point would be reasonable to consider antibiotics.  Return if symptoms worsen or fail to improve.  Melida Quitter, PA

## 2022-09-20 NOTE — Patient Instructions (Addendum)
Follow these instructions at home: If your symptoms are getting worse around next Friday through Monday, get in touch with our office to let them know.  At that point, we will be suspicious of a bacterial infection taking over for the virus that is causing your symptoms right now. Medicines Take the medications that have been prescribed for you.  You can take the cough suppressant twice daily and use the nasal spray once daily to open up your sinuses and relieve the pressure. Check that your over-the-counter medicines do not have the same active ingredients as what has been prescribed.  They should not have the same ingredient, but I do recommend checking just in case. The 2 medicines that I prescribed for you are safe to take with DayQuil and NyQuil.  It is also safe to take ibuprofen for any aches and pains if you need to. General instructions Watch for any changes to your cough. Tell your provider about them. Always cover your mouth when you cough. If the air is dry in your bedroom or home, use a cool mist vaporizer or humidifier. If your cough is worse at night, try to sleep in a semi-upright position. Rest as needed. Stay hydrated! Drink enough fluid to keep your pee (urine) pale yellow. Contact a health care provider if: You have new symptoms, or your symptoms get worse. You cough up pus. You have a fever that does not go away or a cough that does not get better after 2-3 weeks. You cannot control your cough with medicine, and you are losing sleep. You have pain that gets worse or is not helped with medicine. You lose weight for no clear reason. You have night sweats. Get help right away if: You cough up blood. You have trouble breathing. Your heart is beating very fast. These symptoms may be an emergency. Get help right away. Call 911. Do not wait to see if the symptoms will go away. Do not drive yourself to the hospital.

## 2022-09-21 ENCOUNTER — Telehealth: Payer: Self-pay | Admitting: *Deleted

## 2022-09-21 NOTE — Telephone Encounter (Signed)
Pt calling to say that she saw provider yesterday and that she has been dealing with watery diarrhea for over a week and she is using Imodium.  She said that she is now having abdominal pain.  Informed her that provider is out of office would route to provider here and also that if it get so severe she can not tolerate she should go to the emergency room.  Please advise of any recommendations.

## 2022-09-22 MED ORDER — NIRMATRELVIR/RITONAVIR (PAXLOVID)TABLET: 3.0000 | ORAL_TABLET | Freq: Two times a day (BID) | ORAL | 0 refills | Status: AC

## 2022-09-22 NOTE — Telephone Encounter (Signed)
She is in the fifth day currently.  She could start the medication but its effect may not be as noticeable as if she had started earlier.  I will send in the medication.  I have sent her a work excuse through [employer].

## 2022-09-22 NOTE — Telephone Encounter (Signed)
Pt called and I informed her of below.  She said that she would like the medication sent in if she is still in the window for it.  She said her symptoms started Saturday.  Also she would like a letter excusing her from work and for this to have the return date on it.  I told her that I was unsure of the current guidelines and would send to provider to get this letter.

## 2022-09-22 NOTE — Telephone Encounter (Signed)
Patient tested positive for COVID.  This is likely a symptom of her COVID.  Let her know I can send in Paxlovid to help treat the COVID if she wants.  It may help resolve her symptoms more quickly.  For her diarrhea she needs to drink plenty of fluids to avoid dehydration.  Fluids should be mostly water but should include some electrolyte drinks such as Gatorade, Pedialyte.  If she has any difficulty breathing, or if diarrhea is severe and she feels she is becoming dehydrated despite adequate fluid intake she should be seen by the emergency department.

## 2022-10-04 ENCOUNTER — Other Ambulatory Visit: Payer: Self-pay | Admitting: Nurse Practitioner

## 2022-10-04 DIAGNOSIS — G629 Polyneuropathy, unspecified: Secondary | ICD-10-CM

## 2022-10-15 ENCOUNTER — Other Ambulatory Visit: Payer: 59

## 2022-10-15 DIAGNOSIS — I10 Essential (primary) hypertension: Secondary | ICD-10-CM

## 2022-10-15 DIAGNOSIS — E1169 Type 2 diabetes mellitus with other specified complication: Secondary | ICD-10-CM

## 2022-10-15 DIAGNOSIS — E1140 Type 2 diabetes mellitus with diabetic neuropathy, unspecified: Secondary | ICD-10-CM

## 2022-10-16 LAB — LIPID PANEL
Chol/HDL Ratio: 3.9 ratio (ref 0.0–4.4)
Cholesterol, Total: 137 mg/dL (ref 100–199)
HDL: 35 mg/dL — ABNORMAL LOW (ref >39)
LDL Chol Calc (NIH): 67 mg/dL (ref 0–99)
Triglycerides: 212 mg/dL — ABNORMAL HIGH (ref 0–149)
VLDL Cholesterol Cal: 35 mg/dL (ref 5–40)

## 2022-10-16 LAB — COMPREHENSIVE METABOLIC PANEL
ALT: 40 IU/L — ABNORMAL HIGH (ref 0–32)
AST: 31 IU/L (ref 0–40)
Albumin: 4.2 g/dL (ref 3.9–4.9)
Alkaline Phosphatase: 73 IU/L (ref 44–121)
BUN/Creatinine Ratio: 20 (ref 12–28)
BUN: 9 mg/dL (ref 8–27)
Bilirubin Total: 0.4 mg/dL (ref 0.0–1.2)
CO2: 23 mmol/L (ref 20–29)
Calcium: 8.8 mg/dL (ref 8.7–10.3)
Chloride: 105 mmol/L (ref 96–106)
Creatinine, Ser: 0.46 mg/dL — ABNORMAL LOW (ref 0.57–1.00)
Globulin, Total: 1.7 g/dL (ref 1.5–4.5)
Glucose: 137 mg/dL — ABNORMAL HIGH (ref 70–99)
Potassium: 4.3 mmol/L (ref 3.5–5.2)
Sodium: 144 mmol/L (ref 134–144)
Total Protein: 5.9 g/dL — ABNORMAL LOW (ref 6.0–8.5)
eGFR: 109 mL/min/{1.73_m2} (ref >59)

## 2022-10-16 LAB — HEMOGLOBIN A1C
Est. average glucose Bld gHb Est-mCnc: 160 mg/dL
Hgb A1c MFr Bld: 7.2 % — ABNORMAL HIGH (ref 4.8–5.6)

## 2022-10-16 LAB — CBC WITH DIFFERENTIAL/PLATELET
Basophils Absolute: 0.1 10*3/uL (ref 0.0–0.2)
Basos: 1 %
EOS (ABSOLUTE): 0.2 10*3/uL (ref 0.0–0.4)
Eos: 3 %
Hematocrit: 38.7 % (ref 34.0–46.6)
Hemoglobin: 12.7 g/dL (ref 11.1–15.9)
Immature Grans (Abs): 0 10*3/uL (ref 0.0–0.1)
Immature Granulocytes: 0 %
Lymphocytes Absolute: 2.8 10*3/uL (ref 0.7–3.1)
Lymphs: 43 %
MCH: 30.4 pg (ref 26.6–33.0)
MCHC: 32.8 g/dL (ref 31.5–35.7)
MCV: 93 fL (ref 79–97)
Monocytes Absolute: 0.4 10*3/uL (ref 0.1–0.9)
Monocytes: 6 %
Neutrophils Absolute: 3.1 10*3/uL (ref 1.4–7.0)
Neutrophils: 47 %
Platelets: 237 10*3/uL (ref 150–450)
RBC: 4.18 x10E6/uL (ref 3.77–5.28)
RDW: 12.5 % (ref 11.7–15.4)
WBC: 6.6 10*3/uL (ref 3.4–10.8)

## 2022-10-22 ENCOUNTER — Ambulatory Visit: Payer: 59 | Admitting: Family Medicine

## 2022-10-22 ENCOUNTER — Encounter: Payer: Self-pay | Admitting: Family Medicine

## 2022-10-22 VITALS — BP 108/71 | Resp 18 | Ht 64.0 in | Wt 164.0 lb

## 2022-10-22 DIAGNOSIS — E1159 Type 2 diabetes mellitus with other circulatory complications: Secondary | ICD-10-CM | POA: Diagnosis not present

## 2022-10-22 DIAGNOSIS — M797 Fibromyalgia: Secondary | ICD-10-CM

## 2022-10-22 DIAGNOSIS — E1169 Type 2 diabetes mellitus with other specified complication: Secondary | ICD-10-CM

## 2022-10-22 DIAGNOSIS — L209 Atopic dermatitis, unspecified: Secondary | ICD-10-CM | POA: Diagnosis not present

## 2022-10-22 DIAGNOSIS — H04123 Dry eye syndrome of bilateral lacrimal glands: Secondary | ICD-10-CM

## 2022-10-22 DIAGNOSIS — E1140 Type 2 diabetes mellitus with diabetic neuropathy, unspecified: Secondary | ICD-10-CM | POA: Diagnosis not present

## 2022-10-22 DIAGNOSIS — Z122 Encounter for screening for malignant neoplasm of respiratory organs: Secondary | ICD-10-CM

## 2022-10-22 DIAGNOSIS — Z23 Encounter for immunization: Secondary | ICD-10-CM | POA: Diagnosis not present

## 2022-10-22 DIAGNOSIS — Z7984 Long term (current) use of oral hypoglycemic drugs: Secondary | ICD-10-CM

## 2022-10-22 DIAGNOSIS — K117 Disturbances of salivary secretion: Secondary | ICD-10-CM

## 2022-10-22 DIAGNOSIS — R682 Dry mouth, unspecified: Secondary | ICD-10-CM

## 2022-10-22 DIAGNOSIS — E785 Hyperlipidemia, unspecified: Secondary | ICD-10-CM

## 2022-10-22 DIAGNOSIS — I152 Hypertension secondary to endocrine disorders: Secondary | ICD-10-CM

## 2022-10-22 MED ORDER — METFORMIN HCL 500 MG PO TABS: 500.0000 mg | ORAL_TABLET | Freq: Two times a day (BID) | ORAL | 1 refills | Status: DC

## 2022-10-22 MED ORDER — BISOPROLOL-HYDROCHLOROTHIAZIDE 10-6.25 MG PO TABS
ORAL_TABLET | ORAL | 0 refills | Status: DC
Start: 2022-10-22 — End: 2023-01-10

## 2022-10-22 MED ORDER — OMEGA-3-ACID ETHYL ESTERS 1 G PO CAPS
1.0000 | ORAL_CAPSULE | Freq: Every day | ORAL | 1 refills | Status: DC
Start: 2022-10-22 — End: 2023-09-09

## 2022-10-22 MED ORDER — ATORVASTATIN CALCIUM 20 MG PO TABS
20.0000 mg | ORAL_TABLET | Freq: Every day | ORAL | 2 refills | Status: DC
Start: 2022-10-22 — End: 2023-04-19

## 2022-10-22 MED ORDER — CLOBETASOL PROPIONATE 0.05 % EX CREA: 1.0000 | TOPICAL_CREAM | Freq: Two times a day (BID) | CUTANEOUS | 3 refills | Status: AC

## 2022-10-22 MED ORDER — DULOXETINE HCL 20 MG PO CPEP
20.0000 mg | ORAL_CAPSULE | Freq: Every day | ORAL | 2 refills | Status: DC
Start: 2022-10-22 — End: 2023-01-27

## 2022-10-22 NOTE — Assessment & Plan Note (Signed)
Last lipid panel: LDL 67, HDL 35, triglycerides 212.  Encouraged to continue with lifestyle changes and we will monitor triglycerides.  Triglycerides should continue to decrease as we get A1c under better control, if not consider adding fenofibrate.  Patient is agreeable to this plan.  Continue atorvastatin 20 mg daily.  Will continue to monitor.

## 2022-10-22 NOTE — Assessment & Plan Note (Signed)
A1c increased to 7.2.  Increase metformin to 500 mg twice daily.  Encouraged to continue with low-carb diet and routine physical activity.  Will continue to monitor.

## 2022-10-22 NOTE — Assessment & Plan Note (Signed)
BP goal <130/80.  Stable, at goal.  Continue bisoprolol-hydrochlorothiazide 10-6.25 mg daily.  Most recent CMP essentially within normal limits other than mildly elevated ALT which we will monitor.

## 2022-10-22 NOTE — Assessment & Plan Note (Signed)
Trial of Cymbalta 20 mg daily for fibromyalgia pain.

## 2022-10-22 NOTE — Progress Notes (Signed)
Established Patient Office Visit  Subjective   Patient ID: Mary Wade, female    DOB: 1960/08/20  Age: 62 y.o. MRN: [id number]  Chief Complaint  Patient presents with   Diabetes   Hyperlipidemia   Medical Management of Chronic Issues    HPI Mary Wade is a 62 y.o. female presenting today for follow up of hypertension, hyperlipidemia, diabetes.  She also has concerns of dry mouth, eyes, and nose that has been occurring for several years but has worsened over the past 2 months.  Her eyes feel grainy and her vision is sometimes blurry because it is so dry.  She sometimes wakes up at night to go to the bathroom and always needs to take a drink to alleviate her dry mouth.  She also has fibromyalgia and finds that she needs additional relief of pain during the day though gabapentin is still working well to help her sleep at night. Hypertension: Patient here for follow-up of elevated blood pressure.  Pt denies chest pain, SOB, dizziness, edema, syncope, fatigue or heart palpitations. Taking bisoprolol-hydrochlorothiazide, reports excellent compliance with treatment. Denies side effects. Hyperlipidemia: tolerating atorvastatin well with no myalgias or significant side effects.  We are continuing to monitor liver function, ALT slightly elevated at 40. The 10-year ASCVD risk score (Arnett DK, et al., 2019) is: 6.7% Diabetes: denies hypoglycemic events, wounds or sores that are not healing well, increased thirst or urination. Denies vision problems, eye exam completed and records have been requested.  Taking metformin 250 mg twice daily as prescribed without any side effects.   ROS Negative unless otherwise noted in HPI   Objective:     BP 108/71 (BP Location: Left Arm, Patient Position: Sitting, Cuff Size: Normal)   Resp 18   Ht 5\' 4"  (1.626 m)   Wt 164 lb (74.4 kg)   SpO2 98%   BMI 28.15 kg/m   Physical Exam Constitutional:      General: She is not in acute distress.    Appearance:  Normal appearance.  HENT:     Head: Normocephalic and atraumatic.  Cardiovascular:     Rate and Rhythm: Normal rate and regular rhythm.     Heart sounds: No murmur heard.    No friction rub. No gallop.  Pulmonary:     Effort: Pulmonary effort is normal. No respiratory distress.     Breath sounds: No wheezing, rhonchi or rales.  Skin:    General: Skin is warm and dry.  Neurological:     Mental Status: She is alert and oriented to person, place, and time.    Diabetic Foot Exam - Simple   Simple Foot Form Diabetic Foot exam was performed with the following findings: Yes 10/22/2022  9:35 AM  Visual Inspection No deformities, no ulcerations, no other skin breakdown bilaterally: Yes Sensation Testing See comments: Yes Pulse Check Posterior Tibialis and Dorsalis pulse intact bilaterally: Yes Comments No sensation on left foot Sensation intact on majority of right foot with the exception of the toes     Assessment & Plan:  Hypertension associated with diabetes (HCC) Assessment & Plan: BP goal <130/80.  Stable, at goal.  Continue bisoprolol-hydrochlorothiazide 10-6.25 mg daily.  Most recent CMP essentially within normal limits other than mildly elevated ALT which we will monitor.  Orders: -     Bisoprolol-hydroCHLOROthiazide; TAKE 1 TABLET BY MOUTH DAILY. **NEED OFFICE VISIT FOR REFILLS**  Dispense: 30 tablet; Refill: 0  Hyperlipidemia associated with type 2 diabetes mellitus (HCC) Assessment &  Plan: Last lipid panel: LDL 67, HDL 35, triglycerides 212.  Encouraged to continue with lifestyle changes and we will monitor triglycerides.  Triglycerides should continue to decrease as we get A1c under better control, if not consider adding fenofibrate.  Patient is agreeable to this plan.  Continue atorvastatin 20 mg daily.  Will continue to monitor.  Orders: -     Omega-3-acid Ethyl Esters; Take 1 capsule (1 g total) by mouth daily.  Dispense: 90 capsule; Refill: 1 -     Atorvastatin  Calcium; Take 1 tablet (20 mg total) by mouth daily.  Dispense: 30 tablet; Refill: 2  Type 2 diabetes mellitus with diabetic neuropathy, without long-term current use of insulin (HCC) Assessment & Plan: A1c increased to 7.2.  Increase metformin to 500 mg twice daily.  Encouraged to continue with low-carb diet and routine physical activity.  Will continue to monitor.  Orders: -     metFORMIN HCl; Take 1 tablet (500 mg total) by mouth 2 (two) times daily with a meal.  Dispense: 90 tablet; Refill: 1  Xerostomia -     Sjogren's syndrome antibods(ssa + ssb); Future  Dry mouth and eyes -     Sjogren's syndrome antibods(ssa + ssb); Future  Need for zoster vaccination -     Varicella-zoster vaccine IM  Atopic dermatitis, unspecified type -     Clobetasol Propionate; Apply 1 Application topically 2 (two) times daily.  Dispense: 60 g; Refill: 3  Screening for lung cancer -     CT CHEST LUNG CANCER SCREENING LOW DOSE WO CONTRAST; Future  Will screen for Sjogren's given several years of dry mucous membranes. Will continue to monitor ALT.  Return in about 3 months (around 01/21/2023) for follow-up for HTN, HLD, DM, fibromyalgia, possible Sjogren's.   I spent 45 minutes on the day of the encounter to include pre-visit record review, face-to-face time with the patient reviewing chronic conditions and addressing new concerns, and post visit ordering of tests and medications.  Melida Quitter, PA

## 2022-10-24 LAB — SJOGREN'S SYNDROME ANTIBODS(SSA + SSB)
ENA SSA (RO) Ab: <0.2 AI (ref 0.0–0.9)
ENA SSB (LA) Ab: <0.2 AI (ref 0.0–0.9)

## 2022-10-27 ENCOUNTER — Telehealth: Payer: Self-pay

## 2022-10-27 DIAGNOSIS — E1140 Type 2 diabetes mellitus with diabetic neuropathy, unspecified: Secondary | ICD-10-CM

## 2022-10-27 MED ORDER — METFORMIN HCL ER (OSM) 500 MG PO TB24: 500.0000 mg | ORAL_TABLET | Freq: Two times a day (BID) | ORAL | 1 refills | Status: DC

## 2022-10-27 NOTE — Telephone Encounter (Signed)
Meds ordered this encounter  Medications   metformin (FORTAMET) 500 MG (OSM) 24 hr tablet    Sig: Take 1 tablet (500 mg total) by mouth 2 (two) times daily with a meal.    Dispense:  180 tablet    Refill:  1    Order Specific Question:   Supervising Provider    Answer:   Nani Gasser D [[id number]]

## 2022-10-27 NOTE — Telephone Encounter (Signed)
Pt states that last week during her visit it was talked about starting an extended release Metformin.  Pt noticed that the medication was the same that she had before.

## 2022-10-27 NOTE — Addendum Note (Signed)
Addended by: Saralyn Pilar on: 10/27/2022 09:49 AM   Modules accepted: Orders

## 2022-11-11 ENCOUNTER — Encounter: Payer: Self-pay | Admitting: Family Medicine

## 2022-11-19 ENCOUNTER — Inpatient Hospital Stay: Admission: RE | Admit: 2022-11-19 | Payer: 59 | Source: Ambulatory Visit

## 2022-11-20 ENCOUNTER — Other Ambulatory Visit: Payer: Self-pay | Admitting: Nurse Practitioner

## 2022-11-20 ENCOUNTER — Other Ambulatory Visit: Payer: Self-pay | Admitting: Family Medicine

## 2022-11-20 DIAGNOSIS — E1140 Type 2 diabetes mellitus with diabetic neuropathy, unspecified: Secondary | ICD-10-CM

## 2022-11-22 MED ORDER — OMEPRAZOLE 20 MG PO CPDR: 40.0000 mg | DELAYED_RELEASE_CAPSULE | Freq: Every day | ORAL | 1 refills | Status: AC

## 2022-11-30 ENCOUNTER — Other Ambulatory Visit: Payer: Self-pay | Admitting: Family Medicine

## 2022-11-30 DIAGNOSIS — G629 Polyneuropathy, unspecified: Secondary | ICD-10-CM

## 2022-12-03 ENCOUNTER — Ambulatory Visit: Payer: 59 | Admitting: Cardiology

## 2022-12-13 NOTE — Progress Notes (Signed)
HPI: FU bicuspid aortic valve. ABIs June 2015 normal.  Monitor September 2015 showed sinus with PVCs.  Exercise treadmill September 2016 negative.  Brain MRA October 2021 showed slight vascular prominence of the right M2 region unchanged from 2015.  CTA February 2022 showed no thoracic aortic aneurysm but there was note of coronary calcification.  There was note of a 5 mm right-sided pulmonary nodule and follow-up recommended in 12 months (follow-up chest CT June 2023 showed no change and no further follow-up recommended).  Carotid Dopplers May 2022 showed less than 50% stenosis on the left and right.  Follow-up echocardiogram May 2023 showed normal LV function, grade 1 diastolic dysfunction, mild to moderate aortic insufficiency, mild aortic stenosis with mean gradient 10 mmHg and aortic valve area 1.52 cm, mildly dilated aortic root at 39 mm.  Since last seen, the patient has dyspnea with more extreme activities but not with routine activities. It is relieved with rest. It is not associated with chest pain. There is no orthopnea, PND or pedal edema. There is no syncope or palpitations. There is no exertional chest pain.   Current Outpatient Medications  Medication Sig Dispense Refill   albuterol (VENTOLIN HFA) 108 (90 Base) MCG/ACT inhaler Inhale 2 puffs into the lungs every 6 (six) hours as needed for wheezing or shortness of breath. 8 g 0   ALPRAZolam (XANAX) 0.5 MG tablet Take 1 tablet (0.5 mg total) by mouth as needed. 30 tablet 2   atorvastatin (LIPITOR) 20 MG tablet Take 1 tablet (20 mg total) by mouth daily. 30 tablet 2   bisoprolol-hydrochlorothiazide (ZIAC) 10-6.25 MG tablet TAKE 1 TABLET BY MOUTH DAILY. **NEED OFFICE VISIT FOR REFILLS** 30 tablet 0   blood glucose meter kit and supplies Dispense based on patient and insurance preference. Use up to four times daily as directed. (FOR ICD-10 E10.9, E11.9). 1 each 0   blood glucose meter kit and supplies Dispense based on patient and  insurance preference. Use to check fasting blood sugar and 2 hrs after largest meal.(FOR ICD-10 E10.9, E11.9). 1 each 0   clobetasol cream (TEMOVATE) 0.05 % Apply 1 Application topically 2 (two) times daily. 60 g 3   DULoxetine (CYMBALTA) 20 MG capsule Take 1 capsule (20 mg total) by mouth daily. 30 capsule 2   fluticasone (FLONASE) 50 MCG/ACT nasal spray Place 2 sprays into both nostrils daily. 16 g 6   gabapentin (NEURONTIN) 300 MG capsule TAKE 1 CAPSULE BY MOUTH EVERY EVENING 30 capsule 1   metformin (FORTAMET) 500 MG (OSM) 24 hr tablet Take 1 tablet (500 mg total) by mouth 2 (two) times daily with a meal. 180 tablet 1   omega-3 acid ethyl esters (LOVAZA) 1 g capsule Take 1 capsule (1 g total) by mouth daily. 90 capsule 1   omeprazole (PRILOSEC) 20 MG capsule Take 2 capsules (40 mg total) by mouth daily. 90 capsule 1   No current facility-administered medications for this visit.     Past Medical History:  Diagnosis Date   Aortic insufficiency    a. 10/2013: mod AI by echo.   Bicuspid aortic valve    a. 10/24/13 showed EF 55-60%, no RWMA, bicuspid AV with mod AI, mildly dilated ascending aorta, PASP .   Cerebral aneurysm    Chronic diarrhea    Fibromyalgia    Gallstones    GERD (gastroesophageal reflux disease)    Heart murmur    Hyperlipidemia    Hypertension    Pancreatitis  2 previous episodes, last one 2013   PVC's (premature ventricular contractions)    a. 10/2013 event monitor: NSR and PVCs.   Tobacco abuse     Past Surgical History:  Procedure Laterality Date   ABDOMINAL HYSTERECTOMY     APPENDECTOMY     CHOLECYSTECTOMY      Social History   Socioeconomic History   Marital status: Single    Spouse name: Not on file   Number of children: 2   Years of education: Not on file   Highest education level: Associate degree: occupational, Scientist, product/process development, or vocational program  Occupational History    Employer: [employer]    Comment: Dental office  Tobacco Use    Smoking status: Former    Current packs/day: 0.00    Average packs/day: 1 pack/day for 42.0 years (42.0 ttl pk-yrs)    Types: Cigarettes    Start date: 01/22/1974    Quit date: 01/23/2016    Years since quitting: 6.9    Passive exposure: Current   Smokeless tobacco: Never  Vaping Use   Vaping status: Never Used  Substance and Sexual Activity   Alcohol use: Yes    Comment: occasionally   Drug use: No   Sexual activity: Not on file  Other Topics Concern   Not on file  Social History Narrative   Single mother   Daughter and son, about to adopt a foster child in upcoming 2017   Dental hygienist   Social Determinants of Health   Financial Resource Strain: Patient Declined (09/20/2022)   Overall Financial Resource Strain (CARDIA)    Difficulty of Paying Living Expenses: Patient declined  Food Insecurity: Patient Declined (09/20/2022)   Hunger Vital Sign    Worried About Running Out of Food in the Last Year: Patient declined    Ran Out of Food in the Last Year: Patient declined  Transportation Needs: No Transportation Needs (09/20/2022)   PRAPARE - Administrator, Civil Service (Medical): No    Lack of Transportation (Non-Medical): No  Physical Activity: Insufficiently Active (09/20/2022)   Exercise Vital Sign    Days of Exercise per Week: 3 days    Minutes of Exercise per Session: 20 min  Stress: No Stress Concern Present (09/20/2022)   [employer] of Occupational Health - Occupational Stress Questionnaire    Feeling of Stress : Not at all  Social Connections: Moderately Integrated (09/20/2022)   Social Connection and Isolation Panel [NHANES]    Frequency of Communication with Friends and Family: More than three times a week    Frequency of Social Gatherings with Friends and Family: Three times a week    Attends Religious Services: 1 to 4 times per year    Active Member of Clubs or Organizations: No    Attends Engineer, structural: Not on file    Marital  Status: Living with partner  Intimate Partner Violence: Not on file    Family History  Problem Relation Age of Onset   CAD Mother        Died at 44, several blockages but patient unknown when this started   Aneurysm Mother        Brain aneurysm - died of this during surgery   Hypertension Mother    Hyperlipidemia Mother    Thyroid disease Mother    Heart disease Father        Died of MI at age 29 - enlarged heart   Heart attack Father    Early death  Father    Diabetes Paternal Grandmother    Esophageal cancer Cousin    Ulcerative colitis Daughter    Diabetes Daughter    Diabetes Son     ROS: no fevers or chills, productive cough, hemoptysis, dysphasia, odynophagia, melena, hematochezia, dysuria, hematuria, rash, seizure activity, orthopnea, PND, pedal edema, claudication. Remaining systems are negative.  Physical Exam: Well-developed well-nourished in no acute distress.  Skin is warm and dry.  HEENT is normal.  Neck is supple.  Right carotid bruit Chest is clear to auscultation with normal expansion.  Cardiovascular exam is regular rate and rhythm.  1/6 systolic murmur. Abdominal exam nontender or distended. No masses palpated. Extremities show no edema. neuro grossly intact  EKG Interpretation Date/Time:  Friday December 17 2022 09:35:59 EDT Ventricular Rate:  74 PR Interval:  150 QRS Duration:  82 QT Interval:  426 QTC Calculation: 472 R Axis:   16  Text Interpretation: Normal sinus rhythm Normal ECG Confirmed by Olga Millers ([id number]) on 12/17/2022 9:37:00 AM    A/P  1 bicuspid aortic valve with aortic insufficiency/aortic stenosis-plan follow-up echocardiogram.  Patient understands she will likely require aortic valve replacement in the future.  She understands the symptoms to be aware of including dyspnea, chest pain and syncope.  2 coronary calcification-she denies chest pain.  Continue statin.  3 hypertension-patient's blood pressure is controlled.    4  hyperlipidemia-continue statin.  5 right carotid bruit-will arrange carotid Dopplers to further assess.  Olga Millers, MD

## 2022-12-17 ENCOUNTER — Encounter: Payer: Self-pay | Admitting: Cardiology

## 2022-12-17 ENCOUNTER — Ambulatory Visit: Payer: 59 | Attending: Cardiology | Admitting: Cardiology

## 2022-12-17 VITALS — BP 138/66 | HR 74 | Ht 64.0 in | Wt 163.6 lb

## 2022-12-17 DIAGNOSIS — I2510 Atherosclerotic heart disease of native coronary artery without angina pectoris: Secondary | ICD-10-CM

## 2022-12-17 DIAGNOSIS — E7800 Pure hypercholesterolemia, unspecified: Secondary | ICD-10-CM

## 2022-12-17 DIAGNOSIS — Q2381 Bicuspid aortic valve: Secondary | ICD-10-CM

## 2022-12-17 DIAGNOSIS — I493 Ventricular premature depolarization: Secondary | ICD-10-CM | POA: Diagnosis not present

## 2022-12-17 DIAGNOSIS — I10 Essential (primary) hypertension: Secondary | ICD-10-CM | POA: Diagnosis not present

## 2022-12-17 DIAGNOSIS — R0989 Other specified symptoms and signs involving the circulatory and respiratory systems: Secondary | ICD-10-CM

## 2022-12-17 NOTE — Patient Instructions (Signed)
    Testing/Procedures:  Your physician has requested that you have an echocardiogram. Echocardiography is a painless test that uses sound waves to create images of your heart. It provides your doctor with information about the size and shape of your heart and how well your heart's chambers and valves are working. This procedure takes approximately one hour. There are no restrictions for this procedure. Please do NOT wear cologne, perfume, aftershave, or lotions (deodorant is allowed). Please arrive 15 minutes prior to your appointment time. [address]  Please note: We ask at that you not bring children with you during ultrasound (echo/ vascular) testing. Due to room size and safety concerns, children are not allowed in the ultrasound rooms during exams. Our front office staff cannot provide observation of children in our lobby area while testing is being conducted. An adult accompanying a patient to their appointment will only be allowed in the ultrasound room at the discretion of the ultrasound technician under special circumstances. We apologize for any inconvenience.    Your physician has requested that you have a carotid duplex. This test is an ultrasound of the carotid arteries in your neck. It looks at blood flow through these arteries that supply the brain with blood. Allow one hour for this exam. There are no restrictions or special instructions. [facility]  Follow-Up: At [facility], you and your health needs are our priority.  As part of our continuing mission to provide you with exceptional heart care, we have created designated Provider Care Teams.  These Care Teams include your primary Cardiologist (physician) and Advanced Practice Providers (APPs -  Physician Assistants and Nurse Practitioners) who all work together to provide you with the care you need, when you need it.  We recommend signing up for the patient portal called "MyChart".  Sign up  information is provided on this After Visit Summary.  MyChart is used to connect with patients for Virtual Visits (Telemedicine).  Patients are able to view lab/test results, encounter notes, upcoming appointments, etc.  Non-urgent messages can be sent to your provider as well.   To learn more about what you can do with MyChart, go to [email or web address].    Your next appointment:   12 month(s)  Provider:   Olga Millers MD

## 2022-12-25 ENCOUNTER — Other Ambulatory Visit: Payer: Self-pay | Admitting: Family Medicine

## 2022-12-25 DIAGNOSIS — I152 Hypertension secondary to endocrine disorders: Secondary | ICD-10-CM

## 2022-12-31 ENCOUNTER — Ambulatory Visit (HOSPITAL_COMMUNITY)
Admission: RE | Admit: 2022-12-31 | Discharge: 2022-12-31 | Disposition: A | Discharge status: Home / Self Care | Payer: 59 | Source: Ambulatory Visit | Attending: Internal Medicine | Admitting: Internal Medicine

## 2022-12-31 ENCOUNTER — Encounter: Payer: Self-pay | Admitting: Family Medicine

## 2022-12-31 DIAGNOSIS — R0989 Other specified symptoms and signs involving the circulatory and respiratory systems: Secondary | ICD-10-CM | POA: Diagnosis present

## 2022-12-31 NOTE — Telephone Encounter (Signed)
 Care team updated and letter sent for eye exam notes.

## 2023-01-10 ENCOUNTER — Other Ambulatory Visit: Payer: Self-pay | Admitting: Family Medicine

## 2023-01-10 DIAGNOSIS — E1159 Type 2 diabetes mellitus with other circulatory complications: Secondary | ICD-10-CM

## 2023-01-10 MED ORDER — BISOPROLOL-HYDROCHLOROTHIAZIDE 10-6.25 MG PO TABS: ORAL_TABLET | ORAL | 0 refills | Status: DC

## 2023-01-10 NOTE — Telephone Encounter (Signed)
Copied from CRM [id number]. Topic: Clinical - Medication Refill >> Jan 10, 2023  2:32 PM Gaetano Hawthorne wrote: Most Recent Primary Care Visit:  Provider: Saralyn Pilar A  Department: [facility]  Visit Type: FOLLOW UP 20  Date: 10/22/2022  Medication:   Has the patient contacted their pharmacy?  (Agent: If no, request that the patient contact the pharmacy for the refill. If patient does not wish to contact the pharmacy document the reason why and proceed with request.) (Agent: If yes, when and what did the pharmacy advise?)  Is this the correct pharmacy for this prescription?  If no, delete pharmacy and type the correct one.  This is the patient's preferred pharmacy:  [facility] - [place], [place] - [address] [facility] [address] [place] [address] Phone: [phone] Fax: [phone]   Has the prescription been filled recently?   Is the patient out of the medication?   Has the patient been seen for an appointment in the last year OR does the patient have an upcoming appointment?   Can we respond through MyChart?   Agent: Please be advised that Rx refills may take up to 3 business days. We ask that you follow-up with your pharmacy.

## 2023-01-27 ENCOUNTER — Other Ambulatory Visit: Payer: Self-pay | Admitting: Family Medicine

## 2023-01-27 DIAGNOSIS — M797 Fibromyalgia: Secondary | ICD-10-CM

## 2023-01-27 DIAGNOSIS — G629 Polyneuropathy, unspecified: Secondary | ICD-10-CM

## 2023-01-28 ENCOUNTER — Ambulatory Visit (HOSPITAL_COMMUNITY): Payer: 59 | Attending: Cardiology

## 2023-01-28 DIAGNOSIS — Q2381 Bicuspid aortic valve: Secondary | ICD-10-CM | POA: Diagnosis present

## 2023-01-28 LAB — ECHOCARDIOGRAM COMPLETE
AR max vel: 2.33 cm2
AV Area VTI: 2.45 cm2
AV Area mean vel: 2.39 cm2
AV Mean grad: 9 mm[Hg]
AV Peak grad: 18.5 mm[Hg]
Ao pk vel: 2.15 m/s
Area-P 1/2: 4.49 cm2
P 1/2 time: 353 ms
S' Lateral: 3.6 cm

## 2023-01-31 ENCOUNTER — Other Ambulatory Visit (HOSPITAL_COMMUNITY): Payer: Self-pay

## 2023-01-31 DIAGNOSIS — I351 Nonrheumatic aortic (valve) insufficiency: Secondary | ICD-10-CM

## 2023-01-31 DIAGNOSIS — I10 Essential (primary) hypertension: Secondary | ICD-10-CM

## 2023-01-31 DIAGNOSIS — Q2381 Bicuspid aortic valve: Secondary | ICD-10-CM

## 2023-02-17 ENCOUNTER — Encounter: Payer: Self-pay | Admitting: Family Medicine

## 2023-02-17 ENCOUNTER — Ambulatory Visit (INDEPENDENT_AMBULATORY_CARE_PROVIDER_SITE_OTHER): Payer: 59 | Admitting: Family Medicine

## 2023-02-17 VITALS — BP 135/83 | HR 83 | Temp 97.8°F | Ht 64.0 in | Wt 164.0 lb

## 2023-02-17 DIAGNOSIS — J069 Acute upper respiratory infection, unspecified: Secondary | ICD-10-CM

## 2023-02-17 DIAGNOSIS — G629 Polyneuropathy, unspecified: Secondary | ICD-10-CM | POA: Diagnosis not present

## 2023-02-17 MED ORDER — GUAIFENESIN 200 MG PO TABS: 200.0000 mg | ORAL_TABLET | ORAL | 0 refills | Status: AC | PRN

## 2023-02-17 MED ORDER — BENZONATATE 100 MG PO CAPS: 100.0000 mg | ORAL_CAPSULE | Freq: Every evening | ORAL | 0 refills | Status: DC

## 2023-02-17 MED ORDER — GABAPENTIN 100 MG PO CAPS: 200.0000 mg | ORAL_CAPSULE | Freq: Every morning | ORAL | 1 refills | Status: DC

## 2023-02-17 NOTE — Progress Notes (Signed)
 Acute Office Visit  Subjective:     Patient ID: Mary Wade, female    DOB: 1960/06/26, 63 y.o.   MRN: [id number]  Chief Complaint  Patient presents with   Follow-up    HPI Patient is in today for URI symptoms. Symptoms include congestion, cough, and sore throat. Onset of symptoms was 3 days ago, fairly stable since that time. She is drinking plenty of fluids. Evaluation to date: none. Treatment to date: Over-the-counter cough and cold medicine with no improvement. Denies fever, chills, nausea/vomiting, constipation/diarrhea, ear pain, eye pain.  Separately, she notes that her neuropathy of her feet during the day has not been controlled and would like to start a lower morning dose of gabapentin  and continue gabapentin  300 mg at bedtime.  ROS See HPI    Objective:    BP 135/83   Pulse 83   Temp 97.8 F (36.6 C) (Temporal)   Ht 5' 4 (1.626 m)   Wt 164 lb (74.4 kg)   SpO2 94%   BMI 28.15 kg/m   Physical Exam Constitutional:      General: She is not in acute distress.    Appearance: Normal appearance. She is not ill-appearing.  HENT:     Head: Normocephalic and atraumatic.     Right Ear: Tympanic membrane and external ear normal.     Left Ear: Tympanic membrane, ear canal and external ear normal.     Ears:     Comments: Minimal erythema of right ear canal    Nose: Nose normal. No congestion or rhinorrhea.     Mouth/Throat:     Mouth: Mucous membranes are moist.     Pharynx: Oropharynx is clear. No oropharyngeal exudate or posterior oropharyngeal erythema.  Eyes:     General:        Right eye: No discharge.        Left eye: No discharge.     Conjunctiva/sclera: Conjunctivae normal.     Pupils: Pupils are equal, round, and reactive to light.  Cardiovascular:     Rate and Rhythm: Normal rate and regular rhythm.     Heart sounds: No murmur heard.    No friction rub. No gallop.  Pulmonary:     Effort: Pulmonary effort is normal. No respiratory distress.     Breath  sounds: Normal breath sounds. No wheezing, rhonchi or rales.  Skin:    General: Skin is warm and dry.  Neurological:     Mental Status: She is alert and oriented to person, place, and time.       Assessment & Plan:  Viral URI with cough -     guaiFENesin ; Take 1 tablet (200 mg total) by mouth every 4 (four) hours as needed for cough or to loosen phlegm.  Dispense: 30 suppository; Refill: 0 -     Benzonatate ; Take 1 capsule (100 mg total) by mouth at bedtime.  Dispense: 10 capsule; Refill: 0  Neuropathy Assessment & Plan: Continue gabapentin  300 mg nightly at bedtime.  Start gabapentin  200 mg every morning.  Orders: -     Gabapentin ; Take 2 capsules (200 mg total) by mouth in the morning.  Dispense: 180 capsule; Refill: 1  Given course of symptoms, most likely viral URI at this point in time.  Recommend guaifenesin  with adequate hydration to break up mucus.  Use benzonatate  only at night to ensure adequate rest overnight, otherwise do not recommend cough suppressant.  Return if symptoms worsen or fail to improve.  Joesph  DELENA Sear, PA

## 2023-02-17 NOTE — Assessment & Plan Note (Signed)
 Continue gabapentin 300 mg nightly at bedtime.  Start gabapentin 200 mg every morning.

## 2023-03-08 ENCOUNTER — Other Ambulatory Visit: Payer: Self-pay | Admitting: Family Medicine

## 2023-03-08 DIAGNOSIS — E1159 Type 2 diabetes mellitus with other circulatory complications: Secondary | ICD-10-CM

## 2023-03-09 ENCOUNTER — Other Ambulatory Visit: Payer: Self-pay | Admitting: *Deleted

## 2023-03-09 DIAGNOSIS — E1140 Type 2 diabetes mellitus with diabetic neuropathy, unspecified: Secondary | ICD-10-CM

## 2023-03-09 MED ORDER — METFORMIN HCL ER (OSM) 500 MG PO TB24: 500.0000 mg | ORAL_TABLET | Freq: Two times a day (BID) | ORAL | 1 refills | Status: DC

## 2023-03-11 ENCOUNTER — Telehealth: Payer: Self-pay

## 2023-03-11 NOTE — Telephone Encounter (Signed)
Copied from CRM [id number]. Topic: Clinical - Medical Advice >> Mar 11, 2023  9:53 AM Carlatta H wrote: Reason for CRM: Patient thinks she may be having a pancreatitis flare up and would like to have medications called the pharmacy// If she has to be seen that fine//Please call

## 2023-03-11 NOTE — Telephone Encounter (Signed)
Unfortunately, she does need to be evaluated and the cause of her symptoms confirmed in order to determine the appropriate management.  I would recommend going to urgent care or the ER if another primary care office cannot get her in this afternoon

## 2023-03-14 ENCOUNTER — Encounter: Payer: Self-pay | Admitting: Family Medicine

## 2023-03-14 ENCOUNTER — Ambulatory Visit: Payer: 59 | Admitting: Family Medicine

## 2023-03-14 VITALS — BP 175/77 | HR 71 | Temp 98.0°F | Ht 64.0 in | Wt 161.0 lb

## 2023-03-14 DIAGNOSIS — R1013 Epigastric pain: Secondary | ICD-10-CM

## 2023-03-14 DIAGNOSIS — R1011 Right upper quadrant pain: Secondary | ICD-10-CM

## 2023-03-14 DIAGNOSIS — R1012 Left upper quadrant pain: Secondary | ICD-10-CM

## 2023-03-14 DIAGNOSIS — R5383 Other fatigue: Secondary | ICD-10-CM

## 2023-03-14 DIAGNOSIS — R197 Diarrhea, unspecified: Secondary | ICD-10-CM

## 2023-03-14 DIAGNOSIS — J069 Acute upper respiratory infection, unspecified: Secondary | ICD-10-CM

## 2023-03-14 MED ORDER — DIPHENOXYLATE-ATROPINE 2.5-0.025 MG PO TABS: 1.0000 | ORAL_TABLET | Freq: Four times a day (QID) | ORAL | 0 refills | Status: AC | PRN

## 2023-03-14 MED ORDER — BENZONATATE 100 MG PO CAPS
100.0000 mg | ORAL_CAPSULE | Freq: Two times a day (BID) | ORAL | 0 refills | Status: AC | PRN
Start: 2023-03-14 — End: ?

## 2023-03-14 NOTE — Patient Instructions (Signed)
ADULT OTC PAIN MEDICATIONS: For pain control until we get the lab and imaging results back, I would like you to be on a schedule of Tylenol.  Acetaminophen (Tylenol) - Immediate-release: 650 mg every 4 hours - Minimum Dosing Interval: every 4 hours - Maximum Single Dose: 1000 mg - Maximum Dose: 4000 mg per 24 hours  -DO NOT TAKE IF DRINKING ALCOHOL

## 2023-03-14 NOTE — Telephone Encounter (Signed)
Called pt she stated that she is not feeling better she will like to make an appt forward her call to front office

## 2023-03-14 NOTE — Progress Notes (Signed)
Acute Office Visit  Subjective:     Patient ID: Mary Wade, female    DOB: 08-09-1960, 63 y.o.   MRN: [id number]  Chief Complaint  Patient presents with   Follow-up    HPI Patient is in today for concerns of sinus issues, abdominal pain, fatigue, hematuria on urinalysis.  She was seen at primary care on 02/17/2023 with 3 days of URI symptoms, prescribed guaifenesin and benzonatate for viral URI.  Symptoms initially improved, then on 1/18 she again started coughing, the extent of which led to abdominal pain.  She is still experiencing nasal congestion and cough.  Additionally, 3 days ago she started experiencing left-sided abdominal pain.  She has a history of pancreatitis and states that it usually manifests as epigastric and LUQ abdominal pain.  The day after abdominal pain started, she went to urgent care believing that it may be a UTI.  Urinalysis was negative for bacteria but positive for blood.  They discussed that she may be passing a kidney stone.  However, she is still experiencing diarrhea, any increase in abdominal pressure causes leakage of stool. Denies known fever or chills.  She does still have gross hematuria.  Abdominal pain has improved but is still noticeable.  ROS See HPI    Objective:    BP (!) 175/77   Pulse 71   Temp 98 F (36.7 C) (Temporal)   Ht 5\' 4"  (1.626 m)   Wt 161 lb (73 kg)   SpO2 99%   BMI 27.64 kg/m   Physical Exam Constitutional:      General: She is not in acute distress.    Appearance: Normal appearance. She is not ill-appearing.  HENT:     Head: Normocephalic and atraumatic.     Right Ear: Tympanic membrane, ear canal and external ear normal.     Left Ear: Tympanic membrane, ear canal and external ear normal.     Nose: Rhinorrhea present. No congestion.     Mouth/Throat:     Mouth: Mucous membranes are moist.     Pharynx: Oropharynx is clear. No oropharyngeal exudate or posterior oropharyngeal erythema.  Eyes:     General:         Right eye: No discharge.        Left eye: No discharge.     Conjunctiva/sclera: Conjunctivae normal.     Pupils: Pupils are equal, round, and reactive to light.  Cardiovascular:     Rate and Rhythm: Normal rate and regular rhythm.     Heart sounds: No murmur heard.    No friction rub. No gallop.  Pulmonary:     Effort: Pulmonary effort is normal. No respiratory distress.     Breath sounds: Normal breath sounds. No wheezing, rhonchi or rales.  Abdominal:     General: Bowel sounds are normal. There is no distension.     Tenderness: There is abdominal tenderness in the right upper quadrant, epigastric area, suprapubic area and left upper quadrant. There is no right CVA tenderness, left CVA tenderness or rebound.  Skin:    General: Skin is warm and dry.  Neurological:     Mental Status: She is alert and oriented to person, place, and time.       Assessment & Plan:  Diarrhea, unspecified type -     Diphenoxylate-Atropine; Take 1 tablet by mouth 4 (four) times daily as needed for diarrhea or loose stools.  Dispense: 30 tablet; Refill: 0  Fatigue, unspecified type -  CBC with Differential/Platelet; Future -     Comprehensive metabolic panel; Future -     Hemoglobin A1c; Future -     VITAMIN D 25 Hydroxy (Vit-D Deficiency, Fractures); Future -     TSH Rfx on Abnormal to Free T4; Future -     Vitamin B12; Future  Epigastric pain -     CT ABDOMEN W WO CONTRAST -     Amylase; Future -     Lipase; Future  LUQ pain -     CT ABDOMEN W WO CONTRAST  RUQ pain -     CT ABDOMEN W WO CONTRAST  Viral URI with cough -     Benzonatate; Take 1 capsule (100 mg total) by mouth 2 (two) times daily as needed for cough.  Dispense: 20 capsule; Refill: 0  Start workup with CT, amylase, and lipase to rule out pancreatitis given her history of pancreatitis and diabetes mellitus.  Will also collect labs to evaluate for potential causes of fatigue and signs of infection.  Until lab and imaging results  return, use benzonatate as cough suppressant and trial diphenoxylate-atropine for diarrhea.  PDMP reviewed, no aberrancies.  Return in about 1 day (around 03/15/2023) for fasting blood work; 3 month follow up for HTN, HLD, DM, POC A1C at visit.  Melida Quitter, PA

## 2023-03-15 ENCOUNTER — Other Ambulatory Visit: Payer: 59

## 2023-03-15 DIAGNOSIS — R5383 Other fatigue: Secondary | ICD-10-CM

## 2023-03-15 DIAGNOSIS — R1013 Epigastric pain: Secondary | ICD-10-CM

## 2023-03-16 ENCOUNTER — Encounter: Payer: Self-pay | Admitting: Family Medicine

## 2023-03-16 LAB — CBC WITH DIFFERENTIAL/PLATELET
Basophils Absolute: 0.1 10*3/uL (ref 0.0–0.2)
Basos: 1 %
EOS (ABSOLUTE): 0.2 10*3/uL (ref 0.0–0.4)
Eos: 2 %
Hematocrit: 40.4 % (ref 34.0–46.6)
Hemoglobin: 13.6 g/dL (ref 11.1–15.9)
Immature Grans (Abs): 0 10*3/uL (ref 0.0–0.1)
Immature Granulocytes: 1 %
Lymphocytes Absolute: 2.7 10*3/uL (ref 0.7–3.1)
Lymphs: 33 %
MCH: 30.6 pg (ref 26.6–33.0)
MCHC: 33.7 g/dL (ref 31.5–35.7)
MCV: 91 fL (ref 79–97)
Monocytes Absolute: 0.6 10*3/uL (ref 0.1–0.9)
Monocytes: 7 %
Neutrophils Absolute: 4.7 10*3/uL (ref 1.4–7.0)
Neutrophils: 56 %
Platelets: 303 10*3/uL (ref 150–450)
RBC: 4.45 x10E6/uL (ref 3.77–5.28)
RDW: 12.4 % (ref 11.7–15.4)
WBC: 8.2 10*3/uL (ref 3.4–10.8)

## 2023-03-16 LAB — COMPREHENSIVE METABOLIC PANEL
ALT: 31 [IU]/L (ref 0–32)
AST: 24 [IU]/L (ref 0–40)
Albumin: 4.1 g/dL (ref 3.9–4.9)
Alkaline Phosphatase: 74 [IU]/L (ref 44–121)
BUN/Creatinine Ratio: 24 (ref 12–28)
BUN: 11 mg/dL (ref 8–27)
Bilirubin Total: 0.5 mg/dL (ref 0.0–1.2)
CO2: 25 mmol/L (ref 20–29)
Calcium: 9.1 mg/dL (ref 8.7–10.3)
Chloride: 104 mmol/L (ref 96–106)
Creatinine, Ser: 0.46 mg/dL — ABNORMAL LOW (ref 0.57–1.00)
Globulin, Total: 2.2 g/dL (ref 1.5–4.5)
Glucose: 143 mg/dL — ABNORMAL HIGH (ref 70–99)
Potassium: 3.9 mmol/L (ref 3.5–5.2)
Sodium: 144 mmol/L (ref 134–144)
Total Protein: 6.3 g/dL (ref 6.0–8.5)
eGFR: 108 mL/min/{1.73_m2} (ref >59)

## 2023-03-16 LAB — HEMOGLOBIN A1C
Est. average glucose Bld gHb Est-mCnc: 154 mg/dL
Hgb A1c MFr Bld: 7 % — ABNORMAL HIGH (ref 4.8–5.6)

## 2023-03-16 LAB — VITAMIN D 25 HYDROXY (VIT D DEFICIENCY, FRACTURES): Vit D, 25-Hydroxy: 47.7 ng/mL (ref 30.0–100.0)

## 2023-03-16 LAB — LIPASE: Lipase: 24 U/L (ref 14–72)

## 2023-03-16 LAB — AMYLASE: Amylase: 37 U/L (ref 31–110)

## 2023-03-16 LAB — TSH RFX ON ABNORMAL TO FREE T4: TSH: 0.857 u[IU]/mL (ref 0.450–4.500)

## 2023-03-16 LAB — VITAMIN B12: Vitamin B-12: 557 pg/mL (ref 232–1245)

## 2023-03-16 NOTE — Addendum Note (Signed)
Addended by: Saralyn Pilar on: 03/16/2023 11:30 AM   Modules accepted: Orders

## 2023-03-17 ENCOUNTER — Other Ambulatory Visit: Payer: Self-pay | Admitting: Family Medicine

## 2023-03-17 ENCOUNTER — Encounter: Payer: Self-pay | Admitting: Family Medicine

## 2023-03-17 ENCOUNTER — Ambulatory Visit
Admission: RE | Admit: 2023-03-17 | Discharge: 2023-03-17 | Disposition: A | Discharge status: Home / Self Care | Payer: 59 | Source: Ambulatory Visit | Attending: Family Medicine | Admitting: Family Medicine

## 2023-03-17 DIAGNOSIS — R1013 Epigastric pain: Secondary | ICD-10-CM

## 2023-03-17 DIAGNOSIS — R1011 Right upper quadrant pain: Secondary | ICD-10-CM

## 2023-03-17 DIAGNOSIS — N3001 Acute cystitis with hematuria: Secondary | ICD-10-CM

## 2023-03-17 DIAGNOSIS — R2241 Localized swelling, mass and lump, right lower limb: Secondary | ICD-10-CM

## 2023-03-17 DIAGNOSIS — R1012 Left upper quadrant pain: Secondary | ICD-10-CM

## 2023-03-17 MED ORDER — CIPROFLOXACIN HCL 250 MG PO TABS: 250.0000 mg | ORAL_TABLET | Freq: Two times a day (BID) | ORAL | 0 refills | Status: AC

## 2023-03-17 MED ORDER — IOPAMIDOL (ISOVUE-300) INJECTION 61%
500.0000 mL | Freq: Once | INTRAVENOUS | Status: AC | PRN
  Administered 2023-03-17: 100 mL via INTRAVENOUS

## 2023-03-24 ENCOUNTER — Encounter: Payer: Self-pay | Admitting: Family Medicine

## 2023-04-01 ENCOUNTER — Encounter: Payer: Self-pay | Admitting: Podiatry

## 2023-04-01 ENCOUNTER — Ambulatory Visit: Payer: 59 | Admitting: Podiatry

## 2023-04-01 DIAGNOSIS — D2372 Other benign neoplasm of skin of left lower limb, including hip: Secondary | ICD-10-CM

## 2023-04-01 DIAGNOSIS — S90852A Superficial foreign body, left foot, initial encounter: Secondary | ICD-10-CM | POA: Diagnosis not present

## 2023-04-01 NOTE — Progress Notes (Addendum)
   Chief Complaint  Patient presents with   Foot Pain    "I got this knot on my foot.  It was killing me but the pain went away." N - knot on foot L - 2nd met left foot D - 6 mos O - suddenly C - had sharp pains, now sore A - walking, landing on it T - lotion    Subjective: 63 y.o. female presenting to the office today for evaluation of pain and tenderness associated to the left foot.  Patient states that since she made the appointment the pain has improved but she continues to have some mild symptoms to the area.  Idiopathic gradual onset over the past few months   Past Medical History:  Diagnosis Date   Aortic insufficiency    a. 10/2013: mod AI by echo.   Bicuspid aortic valve    a. 10/24/13 showed EF 55-60%, no RWMA, bicuspid AV with mod AI, mildly dilated ascending aorta, PASP .   Cerebral aneurysm    Chronic diarrhea    Fibromyalgia    Gallstones    GERD (gastroesophageal reflux disease)    Heart murmur    Hyperlipidemia    Hypertension    Pancreatitis    2 previous episodes, last one 2013   PVC's (premature ventricular contractions)    a. 10/2013 event monitor: NSR and PVCs.   Tobacco abuse     Past Surgical History:  Procedure Laterality Date   ABDOMINAL HYSTERECTOMY     APPENDECTOMY     CHOLECYSTECTOMY      Allergies  Allergen Reactions   Crestor [Rosuvastatin]     Mood changes   Pravastatin     Muscle aches, elevated LFTs     Objective:  Physical Exam General: Alert and oriented x3 in no acute distress  Dermatology: There is a hyperkeratotic skin lesion noted to the plantar aspect of the left foot with associated tenderness.  With debridement of the lesion there does appear to be a central portion of the lesion that perforates into the deeper tissue.  After debridement of this area there was a foreign body hair embedded within this symptomatic skin lesion approximately 6 mm in length  Vascular: Palpable pedal pulses bilaterally. No edema or  erythema noted. Capillary refill within normal limits.  Neurological: Grossly intact via light touch  Musculoskeletal Exam: Pain on palpation at the keratotic lesion(s) noted. Range of motion within normal limits bilateral. Muscle strength 5/5 in all groups bilateral.  Assessment: 1.  Foreign body splinter/hair left foot   Plan of Care:  -Patient evaluated -Excisional debridement of keratoic lesion(s) using a chisel blade was performed without incident.  - After debridement the foreign body hair was identified and removed from the foot.  Patient felt instant relief -Recommend triple antibiotic and a Band-Aid x 1 week -Return to clinic as needed  Felecia Shelling, DPM [facility]  Dr. Felecia Shelling, DPM    [address] [place], [place] [address]                Office [phone]  Fax [phone]

## 2023-04-04 ENCOUNTER — Other Ambulatory Visit: Payer: Self-pay | Admitting: Family Medicine

## 2023-04-04 DIAGNOSIS — G629 Polyneuropathy, unspecified: Secondary | ICD-10-CM

## 2023-04-19 ENCOUNTER — Other Ambulatory Visit: Payer: Self-pay | Admitting: Family Medicine

## 2023-04-19 DIAGNOSIS — E1169 Type 2 diabetes mellitus with other specified complication: Secondary | ICD-10-CM

## 2023-05-11 ENCOUNTER — Other Ambulatory Visit: Payer: Self-pay | Admitting: Family Medicine

## 2023-05-11 DIAGNOSIS — M797 Fibromyalgia: Secondary | ICD-10-CM

## 2023-06-02 ENCOUNTER — Other Ambulatory Visit: Payer: Self-pay | Admitting: Family Medicine

## 2023-06-02 DIAGNOSIS — E1159 Type 2 diabetes mellitus with other circulatory complications: Secondary | ICD-10-CM

## 2023-07-01 ENCOUNTER — Ambulatory Visit: Payer: 59 | Admitting: Family Medicine

## 2023-07-15 ENCOUNTER — Encounter: Payer: Self-pay | Admitting: Family Medicine

## 2023-07-15 ENCOUNTER — Ambulatory Visit: Admitting: Family Medicine

## 2023-07-15 VITALS — BP 127/78 | HR 73 | Ht 64.0 in | Wt 160.0 lb

## 2023-07-15 DIAGNOSIS — F410 Panic disorder [episodic paroxysmal anxiety] without agoraphobia: Secondary | ICD-10-CM | POA: Diagnosis not present

## 2023-07-15 DIAGNOSIS — E782 Mixed hyperlipidemia: Secondary | ICD-10-CM

## 2023-07-15 DIAGNOSIS — E1140 Type 2 diabetes mellitus with diabetic neuropathy, unspecified: Secondary | ICD-10-CM | POA: Diagnosis not present

## 2023-07-15 DIAGNOSIS — Z23 Encounter for immunization: Secondary | ICD-10-CM

## 2023-07-15 DIAGNOSIS — R5383 Other fatigue: Secondary | ICD-10-CM

## 2023-07-15 DIAGNOSIS — F430 Acute stress reaction: Secondary | ICD-10-CM

## 2023-07-15 DIAGNOSIS — E785 Hyperlipidemia, unspecified: Secondary | ICD-10-CM

## 2023-07-15 DIAGNOSIS — E1169 Type 2 diabetes mellitus with other specified complication: Secondary | ICD-10-CM

## 2023-07-15 DIAGNOSIS — E1159 Type 2 diabetes mellitus with other circulatory complications: Secondary | ICD-10-CM

## 2023-07-15 DIAGNOSIS — I152 Hypertension secondary to endocrine disorders: Secondary | ICD-10-CM

## 2023-07-15 DIAGNOSIS — Z130 Encounter for screening for diseases of the blood and blood-forming organs and certain disorders involving the immune mechanism: Secondary | ICD-10-CM

## 2023-07-15 DIAGNOSIS — G629 Polyneuropathy, unspecified: Secondary | ICD-10-CM

## 2023-07-15 DIAGNOSIS — M797 Fibromyalgia: Secondary | ICD-10-CM

## 2023-07-15 DIAGNOSIS — Z122 Encounter for screening for malignant neoplasm of respiratory organs: Secondary | ICD-10-CM

## 2023-07-15 LAB — POCT GLYCOSYLATED HEMOGLOBIN (HGB A1C): HbA1c POC (<> result, manual entry): 6.6 % (ref 4.0–5.6)

## 2023-07-15 MED ORDER — GABAPENTIN 300 MG PO CAPS
300.0000 mg | ORAL_CAPSULE | Freq: Every evening | ORAL | 2 refills | Status: DC
Start: 2023-07-15 — End: 2023-12-29

## 2023-07-15 MED ORDER — EMPAGLIFLOZIN 25 MG PO TABS: 25.0000 mg | ORAL_TABLET | Freq: Every day | ORAL | 3 refills | Status: AC

## 2023-07-15 MED ORDER — EMPAGLIFLOZIN 10 MG PO TABS
10.0000 mg | ORAL_TABLET | Freq: Every day | ORAL | 0 refills | Status: AC
Start: 2023-07-15 — End: 2023-08-14

## 2023-07-15 MED ORDER — ALPRAZOLAM 0.5 MG PO TABS: 0.5000 mg | ORAL_TABLET | ORAL | 2 refills | Status: AC | PRN

## 2023-07-15 MED ORDER — GABAPENTIN 100 MG PO CAPS: 200.0000 mg | ORAL_CAPSULE | Freq: Every morning | ORAL | 1 refills | Status: AC

## 2023-07-15 NOTE — Assessment & Plan Note (Signed)
 May take alprazolam  0.5 mg daily if needed. New prescription sent to her pharmacy today.

## 2023-07-15 NOTE — Patient Instructions (Addendum)
 It was nice to see you today!  As we discussed in clinic  -Stop taking your Metformin  and replace it with Jardiance that I have sent to your pharmacy. Keep in mind that this medication may make you urinate more often throughout the day. If you begin to notice dizziness upon standing, please send me a MyChart message or call the office to schedule an appointment. -I have placed the referral for your low dose chest CT  -I have refilled your medications to your preferred pharmacy  -I am also ordering blood work for you to get done sometime in the next 2 weeks. I will send a MyChart message explaining the results.  -Otherwise I will plan to see you back in 6 months for medication follow-up.  If you have any problems before your next visit feel free to message me via MyChart (minor issues or questions) or call the office, otherwise you may reach out to schedule an office visit.  Thank you! Meryl Acosta, PA-C

## 2023-07-15 NOTE — Assessment & Plan Note (Signed)
 Has not had an updated lipid panel. New order placed today. Encouraged to continue with lifestyle changes and we will monitor triglycerides. Triglycerides should continue to decrease as we get A1c under better control, if not consider adding fenofibrate . Patient is agreeable to this plan. Continue atorvastatin  20 mg daily. Will continue to monitor.

## 2023-07-15 NOTE — Assessment & Plan Note (Signed)
 BP at goal in clinic and at home. Continue bisoprolol -hydrochlorothiazide  10-6.25 mg daily.

## 2023-07-15 NOTE — Assessment & Plan Note (Signed)
 Pain controlled on Cymbalta  20 mg daily.

## 2023-07-15 NOTE — Assessment & Plan Note (Addendum)
 POC A1c 6.6. Improved from 7% four months ago. Due to symptoms of severe diarrhea on the Metformin , will discontinue and replace with 10 mg Jardiance  daily x 1 month, then 25 mg thereafter. Pt advised that this medication can cause increased urinary frequency. Will recheck A1c in 6 months. Urine microalbumin ordered today.

## 2023-07-15 NOTE — Progress Notes (Signed)
 Established Patient Office Visit  Subjective   Patient ID: Mary Wade, female    DOB: 11-17-1960  Age: 63 y.o. MRN: [id number]  Chief Complaint  Patient presents with   Medical Management of Chronic Issues    HPI Mary Wade is a 63 y.o. female presenting today for follow up of hypertension, hyperlipidemia, diabetes.   Hypertension: Patient here for follow-up of elevated blood pressure.  Pt denies chest pain, SOB, dizziness, edema, syncope, fatigue or heart palpitations. Taking bisoprolol -hydrochlorothiazide , reports excellent compliance with treatment. Denies side effects   Hyperlipidemia: tolerating atorvastatin  well with no myalgias or significant side effects.  Reports she is not taking Lovaza  because she did not know she was supposed to be taking it. We are continuing to monitor liver function. Pt reports she is compliant with medication and denies side effects  The 10-year ASCVD risk score (Arnett DK, et al., 2019) is: 10.2%  Diabetes: denies hypoglycemic events, wounds or sores that are not healing well, increased thirst or urination. Denies vision problems, eye exam completed and records have been requested.  Taking metformin  500 mg XR twice daily but reports that she has been experiencing severe diarrhea for several weeks now. Reports that she has been experiencing incontinence due how frequent the diarrhea is happening. Expresses that she would like to replace the Metformin  with something else to see if this improves her symptoms.   Diabetic Neuropathy: Reports her symptoms are well controlled with 300 mg Gabapentin  nightly and 200 mg in the AM.  Insomnia/Anxiety: Reports that she is taking the Alprazolam  0.5 mg as needed for sleep. Says that she uses it 3-4 times per week.   Fibromyalgia: Symptoms stable on cymbalta .    ROS Per HPI.    Objective:     BP 127/78   Pulse 73   Ht 5\' 4"  (1.626 m)   Wt 160 lb 0.6 oz (72.6 kg)   SpO2 93%   BMI 27.47 kg/m     Physical Exam Constitutional:      General: She is not in acute distress.    Appearance: Normal appearance.  Cardiovascular:     Rate and Rhythm: Normal rate and regular rhythm.     Heart sounds: Normal heart sounds. No murmur heard.    No friction rub. No gallop.  Pulmonary:     Effort: Pulmonary effort is normal. No respiratory distress.     Breath sounds: Normal breath sounds.  Musculoskeletal:        General: No swelling.  Skin:    General: Skin is warm and dry.  Neurological:     General: No focal deficit present.     Mental Status: She is alert.  Psychiatric:        Mood and Affect: Mood normal.        Behavior: Behavior normal.        Thought Content: Thought content normal.      Results for orders placed or performed in visit on 07/15/23  POCT HgB A1C  Result Value Ref Range   Hemoglobin A1C     HbA1c POC (<> result, manual entry) 6.6 4.0 - 5.6 %   HbA1c, POC (prediabetic range)     HbA1c, POC (controlled diabetic range)        The 10-year ASCVD risk score (Arnett DK, et al., 2019) is: 10.2%    Assessment & Plan:   Type 2 diabetes mellitus with diabetic neuropathy, without long-term current use of insulin (HCC) Assessment & Plan:  POC A1c 6.6. Improved from 7% four months ago. Due to symptoms of severe diarrhea on the Metformin , will discontinue and replace with 10 mg Jardiance daily x 1 month, then 25 mg thereafter. Pt advised that this medication can cause increased urinary frequency. Will recheck A1c in 6 months. Urine microalbumin ordered today.  Orders: -     POCT glycosylated hemoglobin (Hb A1C) -     Comprehensive metabolic panel with GFR -     Microalbumin / creatinine urine ratio  Panic attack as reaction to stress Assessment & Plan: May take alprazolam  0.5 mg daily if needed. New prescription sent to her pharmacy today.   Orders: -     ALPRAZolam ; Take 1 tablet (0.5 mg total) by mouth as needed.  Dispense: 30 tablet; Refill: 2 -      Empagliflozin; Take 1 tablet (10 mg total) by mouth daily before breakfast.  Dispense: 30 tablet; Refill: 0 -     Empagliflozin; Take 1 tablet (25 mg total) by mouth daily before breakfast.  Dispense: 30 tablet; Refill: 3  Neuropathy Assessment & Plan: Stable on 300 mg gabapentin  nightly and 200 mg gabapentin  in the mornings.  Orders: -     Gabapentin ; Take 1 capsule (300 mg total) by mouth every evening.  Dispense: 30 capsule; Refill: 2 -     Gabapentin ; Take 2 capsules (200 mg total) by mouth in the morning.  Dispense: 180 capsule; Refill: 1  Mixed hyperlipidemia -     Lipid panel  Screening for deficiency anemia -     CBC with Differential/Platelet  Fatigue, unspecified type -     TSH  Screening for lung cancer -     CT CHEST LUNG CANCER SCREENING LOW DOSE WO CONTRAST; Future  Immunization due -     Varicella-zoster vaccine IM  Fibromyalgia Assessment & Plan: Pain controlled on Cymbalta  20 mg daily.   Hyperlipidemia associated with type 2 diabetes mellitus (HCC) Assessment & Plan: Has not had an updated lipid panel. New order placed today. Encouraged to continue with lifestyle changes and we will monitor triglycerides. Triglycerides should continue to decrease as we get A1c under better control, if not consider adding fenofibrate . Patient is agreeable to this plan. Continue atorvastatin  20 mg daily. Will continue to monitor.    Hypertension associated with diabetes (HCC) Assessment & Plan: BP at goal in clinic and at home. Continue bisoprolol -hydrochlorothiazide  10-6.25 mg daily.    Other fatigue Assessment & Plan: Will recheck TSH today.    Return in about 2 weeks (around 07/29/2023) for fasting labs (orders already placed).    Odilia Bennett, PA-C

## 2023-07-15 NOTE — Assessment & Plan Note (Signed)
 Stable on 300 mg gabapentin  nightly and 200 mg gabapentin  in the mornings.

## 2023-07-15 NOTE — Assessment & Plan Note (Signed)
 Will recheck TSH today

## 2023-07-16 LAB — MICROALBUMIN / CREATININE URINE RATIO
Creatinine, Urine: 41.1 mg/dL
Microalb/Creat Ratio: 13 mg/g{creat} (ref 0–29)
Microalbumin, Urine: 5.3 ug/mL

## 2023-07-18 ENCOUNTER — Ambulatory Visit: Payer: Self-pay

## 2023-07-26 ENCOUNTER — Ambulatory Visit
Admission: RE | Admit: 2023-07-26 | Discharge: 2023-07-26 | Disposition: A | Discharge status: Home / Self Care | Source: Ambulatory Visit

## 2023-07-26 DIAGNOSIS — Z122 Encounter for screening for malignant neoplasm of respiratory organs: Secondary | ICD-10-CM

## 2023-07-27 ENCOUNTER — Other Ambulatory Visit

## 2023-07-28 ENCOUNTER — Encounter: Payer: Self-pay | Admitting: Family Medicine

## 2023-07-28 LAB — CBC WITH DIFFERENTIAL/PLATELET
Basophils Absolute: 0.1 10*3/uL (ref 0.0–0.2)
Basos: 1 %
EOS (ABSOLUTE): 0.3 10*3/uL (ref 0.0–0.4)
Eos: 4 %
Hematocrit: 41.9 % (ref 34.0–46.6)
Hemoglobin: 13.8 g/dL (ref 11.1–15.9)
Immature Grans (Abs): 0 10*3/uL (ref 0.0–0.1)
Immature Granulocytes: 0 %
Lymphocytes Absolute: 3.4 10*3/uL — ABNORMAL HIGH (ref 0.7–3.1)
Lymphs: 41 %
MCH: 30.9 pg (ref 26.6–33.0)
MCHC: 32.9 g/dL (ref 31.5–35.7)
MCV: 94 fL (ref 79–97)
Monocytes Absolute: 0.6 10*3/uL (ref 0.1–0.9)
Monocytes: 7 %
Neutrophils Absolute: 4 10*3/uL (ref 1.4–7.0)
Neutrophils: 46 %
Platelets: 330 10*3/uL (ref 150–450)
RBC: 4.47 x10E6/uL (ref 3.77–5.28)
RDW: 12.9 % (ref 11.7–15.4)
WBC: 8.4 10*3/uL (ref 3.4–10.8)

## 2023-07-28 LAB — LIPID PANEL
Chol/HDL Ratio: 7.5 ratio — ABNORMAL HIGH (ref 0.0–4.4)
Cholesterol, Total: 286 mg/dL — ABNORMAL HIGH (ref 100–199)
HDL: 38 mg/dL — ABNORMAL LOW (ref >39)
LDL Chol Calc (NIH): 156 mg/dL — ABNORMAL HIGH (ref 0–99)
Triglycerides: 476 mg/dL — ABNORMAL HIGH (ref 0–149)
VLDL Cholesterol Cal: 92 mg/dL — ABNORMAL HIGH (ref 5–40)

## 2023-07-28 LAB — COMPREHENSIVE METABOLIC PANEL WITH GFR
ALT: 26 IU/L (ref 0–32)
AST: 23 IU/L (ref 0–40)
Albumin: 4.3 g/dL (ref 3.9–4.9)
Alkaline Phosphatase: 79 IU/L (ref 44–121)
BUN/Creatinine Ratio: 20 (ref 12–28)
BUN: 10 mg/dL (ref 8–27)
Bilirubin Total: 0.3 mg/dL (ref 0.0–1.2)
CO2: 20 mmol/L (ref 20–29)
Calcium: 9.2 mg/dL (ref 8.7–10.3)
Chloride: 100 mmol/L (ref 96–106)
Creatinine, Ser: 0.5 mg/dL — ABNORMAL LOW (ref 0.57–1.00)
Globulin, Total: 2.3 g/dL (ref 1.5–4.5)
Glucose: 133 mg/dL — ABNORMAL HIGH (ref 70–99)
Potassium: 4.3 mmol/L (ref 3.5–5.2)
Sodium: 140 mmol/L (ref 134–144)
Total Protein: 6.6 g/dL (ref 6.0–8.5)
eGFR: 106 mL/min/{1.73_m2} (ref >59)

## 2023-07-28 LAB — TSH: TSH: 1.05 u[IU]/mL (ref 0.450–4.500)

## 2023-07-28 MED ORDER — FENOFIBRATE 67 MG PO CAPS: 67.0000 mg | ORAL_CAPSULE | Freq: Every day | ORAL | 11 refills | Status: AC

## 2023-07-28 NOTE — Telephone Encounter (Signed)
 Patient is schedule at 11:10 07/29/2023

## 2023-07-29 ENCOUNTER — Ambulatory Visit

## 2023-07-29 VITALS — BP 148/77 | HR 76 | Ht 64.0 in | Wt 157.0 lb

## 2023-07-29 DIAGNOSIS — L989 Disorder of the skin and subcutaneous tissue, unspecified: Secondary | ICD-10-CM | POA: Diagnosis not present

## 2023-07-29 NOTE — Progress Notes (Signed)
 Established Patient Office Visit  Subjective   Patient ID: Mary Wade, female    DOB: [birth year]  Age: 63 y.o. MRN: [id number]  Chief Complaint  Patient presents with   Rash    Onset: Sunday/ middle toe on left foot; started off being very itchy    HPI  Mary Wade is a 63 y.o. female who presents to the clinic with concerns for ulcer formation on the middle toe of her left foot.  She noticed a small, red wound on the dorsal aspect of her left middle toe on Saturday.  Then after she saw her lab results with very slightly elevated lymphocytes, she was concerned for ulcer formation and wanted it to be evaluated.  Patient reports that as the week progressed, the red spot formed a bump which she reports has been very itchy.  She believes that she scratched the bump and caused it to form a blister, which she said she popped several days ago.  She has been using over-the-counter hydrocortisone cream on the area, which she reports has improved the itching.  She denies warmth in the area, increased redness, discharge, systemic symptoms such as body aches or fever.    ROS Per HPI.    Objective:     BP (!) 148/77 (BP Location: Left Arm, Patient Position: Sitting, Cuff Size: Normal)   Pulse 76   Ht 5' 4 (1.626 m)   Wt 157 lb 0.6 oz (71.2 kg)   SpO2 96%   BMI 26.96 kg/m    Physical Exam Constitutional:      General: She is not in acute distress.    Appearance: Normal appearance.   Cardiovascular:     Rate and Rhythm: Normal rate and regular rhythm.     Heart sounds: Normal heart sounds. No murmur heard.    No friction rub. No gallop.  Pulmonary:     Effort: Pulmonary effort is normal. No respiratory distress.     Breath sounds: Normal breath sounds.   Musculoskeletal:        General: No swelling.  Feet:     Right foot:     Toenail Condition: Right toenails are normal.     Left foot:     Toenail Condition: Left toenails are normal.     Comments: There is an  approximately 0.5 to 1 cm papule on the dorsal surface of the left middle toe.  There is no warmth, swelling, discharge to it or the surrounding areas.   Skin:    General: Skin is warm and dry.   Neurological:     General: No focal deficit present.     Mental Status: She is alert.   Psychiatric:        Mood and Affect: Mood normal.        Behavior: Behavior normal.        Thought Content: Thought content normal.    No results found for any visits on 07/29/23.    The 10-year ASCVD risk score (Arnett DK, et al., 2019) is: 20.6%    Assessment & Plan:   Skin lesion of foot Assessment & Plan: Had a discussion with patient regarding the etiologies of the bump on her left middle toe.  Given the symptoms of itching and improvement with hydrocortisone, suspect that the bump is likely caused by a bug bite.  Advised patient to continue applying hydrocortisone cream 1% over the area 2-3 times a day for 1 week.  Instructed patient that if the  area becomes more red, swollen, or begins to produce discharge, or she begins experiencing systemic symptoms such as body aches or fever to follow-up so that she can be reevaluated.  Advised patient to avoid wearing shoes that would rub over/irritate the area.  Will continue to monitor.     Return if symptoms worsen or fail to improve.    Odilia Bennett, PA-C

## 2023-07-29 NOTE — Patient Instructions (Signed)
 It was nice to see you today!  As we discussed in clinic:  -I do believe that the sore on your toe is that of a bug bite or blister. Let's plan to continue applying hydrocortisone to the area 2-3 times per day for 7 days to help with healing.  -You do not need to keep it covered. Be sure to avoid wearing shoes that irritate the area or rub against it until it has healed.  -If it begins to cause pain, becomes more red, swollen, or you begin to run a fever, please let me know so we can re-evaluate!   It was good to see you again!    If you have any problems before your next visit feel free to message me via MyChart (minor issues or questions) or call the office, otherwise you may reach out to schedule an office visit.  Thank you! Meryl Acosta, PA-C

## 2023-07-29 NOTE — Assessment & Plan Note (Signed)
 Had a discussion with patient regarding the etiologies of the bump on her left middle toe.  Given the symptoms of itching and improvement with hydrocortisone, suspect that the bump is likely caused by a bug bite.  Advised patient to continue applying hydrocortisone cream 1% over the area 2-3 times a day for 1 week.  Instructed patient that if the area becomes more red, swollen, or begins to produce discharge, or she begins experiencing systemic symptoms such as body aches or fever to follow-up so that she can be reevaluated.  Advised patient to avoid wearing shoes that would rub over/irritate the area.  Will continue to monitor.

## 2023-08-15 ENCOUNTER — Other Ambulatory Visit: Payer: Self-pay

## 2023-08-15 DIAGNOSIS — F410 Panic disorder [episodic paroxysmal anxiety] without agoraphobia: Secondary | ICD-10-CM

## 2023-09-09 ENCOUNTER — Other Ambulatory Visit: Payer: Self-pay | Admitting: Family Medicine

## 2023-09-09 DIAGNOSIS — E1169 Type 2 diabetes mellitus with other specified complication: Secondary | ICD-10-CM

## 2023-10-10 ENCOUNTER — Other Ambulatory Visit: Payer: Self-pay | Admitting: Family Medicine

## 2023-10-10 DIAGNOSIS — M797 Fibromyalgia: Secondary | ICD-10-CM

## 2023-11-28 ENCOUNTER — Ambulatory Visit: Payer: Self-pay | Admitting: *Deleted

## 2023-11-28 ENCOUNTER — Ambulatory Visit (INDEPENDENT_AMBULATORY_CARE_PROVIDER_SITE_OTHER): Admitting: Family Medicine

## 2023-11-28 VITALS — BP 154/101 | HR 57 | Ht 64.0 in | Wt 157.4 lb

## 2023-11-28 DIAGNOSIS — N7689 Other specified inflammation of vagina and vulva: Secondary | ICD-10-CM | POA: Diagnosis not present

## 2023-11-28 NOTE — Telephone Encounter (Signed)
 FYI Only or Action Required?: FYI only for provider.  Patient was last seen in primary care on 07/29/2023 by Gayle Saddie FALCON, PA-C.  Called Nurse Triage reporting Medication Problem.  Symptoms began several weeks ago.  Interventions attempted: Nothing.  Symptoms are: gradually worsening.  Triage Disposition: See Physician Within 24 Hours  Patient/caregiver understands and will follow disposition?: yes   Reason for Disposition  Looks like a boil or deep ulcer  Answer Assessment - Initial Assessment Questions 1. NAME of MEDICINE: What medicine(s) are you calling about?     Jardinace- SE- genital ulcers 2. QUESTION: What is your question? (e.g., double dose of medicine, side effect)     Patient states she may have SE of medication 3. PRESCRIBER: Who prescribed the medicine? Reason: if prescribed by specialist, call should be referred to that group.     PCP  Answer Assessment - Initial Assessment Questions 1. APPEARANCE of SORES: What do the sores look like?      cystic /boil- swollen , painful 2. NUMBER: How many sores are there?     2 3. SIZE: How big is the largest sore?     Dime size 4. LOCATION: Where are the sores located?     labia 5. ONSET: When did the sores begin?     2-3 weeks 6. TENDER: Does it hurt when you touch it?  (Scale 1-10; or mild, moderate, severe)      Tender to touch 7. CAUSE: What do you think is causing the sores?     unsure 8. OTHER SYMPTOMS: Do you have any other symptoms? (e.g., fever, new weakness)     Not SA- over 5 years  Protocols used: Medication Question Call-A-AH, Sores-A-AH Copied from CRM [id number]. Topic: Clinical - Red Word Triage >> Nov 28, 2023  8:34 AM Darshell M wrote: Red Word that prompted transfer to Nurse Triage: Patient taking medication for last six months. Concerned she is having an allergic reaction.

## 2023-11-28 NOTE — Patient Instructions (Addendum)
 It was nice to see you today,  We addressed the following topics today: -Your cysts appear to be due to ingrown hairs.  I do not see any pus or drainage when we used the needle. - Continue to use sitz bath's.  You can get a sitz bath container at pharmacies like [facility] that will allow you to sit on the toilet while you do it.  You can use warm water, warm water with soap, or warm water and Epsom salt. - Soak for 10 to 15 minutes 3-4 times a day. - Wash with soap and water twice a day morning and evening. - If it starts to look more like an infection (red, hot, swollen, more tender) let us  know and we will see you back in the office that day or the next day. - These should drain on their own over time.  Have a great day,  Rolan Slain, MD

## 2023-11-28 NOTE — Progress Notes (Unsigned)
   Acute Office Visit  Subjective:     Patient ID: Mary Wade, female    DOB: February 15, 1961, 63 y.o.   MRN: [id number]  Chief Complaint  Patient presents with   Genital Warts    HPI Patient is in today for   Subjective - Presents for evaluation of bilateral painful genital lesions. Reports onset approximately 2-3 weeks ago, starting as two pimple-like areas which have since evolved into boil-like lesions. One lesion is present on each side. Describes the lesions as painful, not itchy, and not currently draining. Onset occurred two days after an episode of urinary incontinence at work. Has been applying heat compresses and using a peri bottle for sitz baths at home. Reports no prior history of similar lesions.  Medications: Jardiance  (not taken for several days due to concern it may be related to lesions), took one dose of Diflucan recently for a yeast infection, and completed a course of azithromycin  over a week ago for a dental issue.  PMH, PSH, FH, Social Hx: History of HPV, which led to a hysterectomy. History of diabetes. Reports a visit to urgent care in 2016 for a vaginal bump, with a negative HSV swab at that time. Reports not being sexually active for five years.  ROS: Denies itching or drainage from lesions. Reports recent vaginal yeast infection, treated with OTC Monistat and one dose of prescription fluconazole, which has resolved.  Objective GU: External visual inspection reveals two boil-like lesions in the perineal area, not on the labia or within the vagina. Lesions are tender to palpation. No surrounding erythema, warmth, or signs of active infection noted. An attempt was made to drain the lesions with a 21-gauge needle. No purulent drainage was expressed, only a small amount of blood.  Assessment and Plan Infected hair follicles/boils. The presentation with painful, boil-like lesions is consistent with infected hair follicles. The lesions do not appear acutely infected at  this time, and an attempt to drain them did not yield purulence. The recent history of urinary incontinence may be a contributing factor. The lesions are not characteristic of a herpetic outbreak. - Continue sitz baths with Epsom salt. Advised to purchase an over-the-counter sitz bath container for more effective soaking. - Wash the area with soap and water twice daily. - May apply Vaseline as a skin protectant, but counseled it may slightly impede drainage. Avoid antibiotic ointments. - May use gauze to cover the area if drainage occurs to protect clothing. - No oral antibiotics at this time as there are no clear signs of cellulitis. - Provided a work note for absence through Wednesday. - Advised to return if lesions worsen, including increased redness, swelling, tenderness, warmth, or if a fever develops.    ROS      Objective:    BP (!) 154/101   Pulse (!) 57   Ht 5' 4 (1.626 m)   Wt 157 lb 6.4 oz (71.4 kg)   SpO2 99%   BMI 27.02 kg/m  {Vitals History (Optional):23777}  Physical Exam  No results found for any visits on 11/28/23.      Assessment & Plan:   There are no diagnoses linked to this encounter.   No follow-ups on file.  Toribio MARLA Slain, MD

## 2023-11-29 ENCOUNTER — Encounter: Payer: Self-pay | Admitting: Family Medicine

## 2023-12-01 DIAGNOSIS — N7689 Other specified inflammation of vagina and vulva: Secondary | ICD-10-CM | POA: Insufficient documentation

## 2023-12-01 NOTE — Assessment & Plan Note (Signed)
 Infected hair follicles/boils. The presentation with painful, boil-like lesions is consistent with infected hair follicles. The lesions do not appear acutely infected at this time, and an attempt to drain them did not yield purulence. The recent history of urinary incontinence may be a contributing factor. The lesions are not characteristic of a herpetic outbreak. - Continue sitz baths with Epsom salt. Advised to purchase an over-the-counter sitz bath container for more effective soaking. - Wash the area with soap and water twice daily. - May apply Vaseline as a skin protectant, but counseled it may slightly impede drainage. Avoid antibiotic ointments. - May use gauze to cover the area if drainage occurs to protect clothing. - No oral antibiotics at this time as there are no clear signs of cellulitis. - Provided a work note for absence through Wednesday. - Advised to return if lesions worsen, including increased redness, swelling, tenderness, warmth, or if a fever develops.

## 2023-12-28 ENCOUNTER — Other Ambulatory Visit: Payer: Self-pay

## 2023-12-28 DIAGNOSIS — E1169 Type 2 diabetes mellitus with other specified complication: Secondary | ICD-10-CM

## 2023-12-28 DIAGNOSIS — E1159 Type 2 diabetes mellitus with other circulatory complications: Secondary | ICD-10-CM

## 2023-12-28 DIAGNOSIS — G629 Polyneuropathy, unspecified: Secondary | ICD-10-CM

## 2024-01-09 ENCOUNTER — Encounter (HOSPITAL_COMMUNITY): Payer: Self-pay | Admitting: Cardiology

## 2024-01-18 ENCOUNTER — Ambulatory Visit

## 2024-02-03 ENCOUNTER — Other Ambulatory Visit: Payer: Self-pay | Admitting: *Deleted

## 2024-02-03 DIAGNOSIS — Q2381 Bicuspid aortic valve: Secondary | ICD-10-CM

## 2024-02-06 ENCOUNTER — Encounter (HOSPITAL_COMMUNITY): Payer: Self-pay

## 2024-02-13 ENCOUNTER — Encounter (HOSPITAL_COMMUNITY): Payer: Self-pay

## 2024-02-23 ENCOUNTER — Other Ambulatory Visit: Payer: Self-pay

## 2024-02-23 DIAGNOSIS — M797 Fibromyalgia: Secondary | ICD-10-CM

## 2024-03-07 ENCOUNTER — Ambulatory Visit

## 2024-03-19 ENCOUNTER — Other Ambulatory Visit

## 2024-03-20 ENCOUNTER — Ambulatory Visit (HOSPITAL_COMMUNITY)

## 2024-03-27 ENCOUNTER — Ambulatory Visit
